# Patient Record
Sex: Male | Born: 1940 | Race: White | Hispanic: No | Marital: Single | Smoking: Never smoker
Health system: Southern US, Community
[De-identification: ages and names within clinical notes are randomized; demographics above are authoritative.]

## PROBLEM LIST (undated history)

## (undated) DIAGNOSIS — I350 Nonrheumatic aortic (valve) stenosis: Secondary | ICD-10-CM

## (undated) DIAGNOSIS — E785 Hyperlipidemia, unspecified: Secondary | ICD-10-CM

## (undated) DIAGNOSIS — H9190 Unspecified hearing loss, unspecified ear: Secondary | ICD-10-CM

## (undated) DIAGNOSIS — H903 Sensorineural hearing loss, bilateral: Secondary | ICD-10-CM

## (undated) DIAGNOSIS — R42 Dizziness and giddiness: Secondary | ICD-10-CM

## (undated) DIAGNOSIS — R030 Elevated blood-pressure reading, without diagnosis of hypertension: Secondary | ICD-10-CM

## (undated) DIAGNOSIS — I10 Essential (primary) hypertension: Secondary | ICD-10-CM

## (undated) DIAGNOSIS — IMO0002 Reserved for concepts with insufficient information to code with codable children: Secondary | ICD-10-CM

## (undated) DIAGNOSIS — H9313 Tinnitus, bilateral: Secondary | ICD-10-CM

## (undated) DIAGNOSIS — R739 Hyperglycemia, unspecified: Secondary | ICD-10-CM

## (undated) HISTORY — PX: EYE SURGERY: SHX253

## (undated) HISTORY — DX: Hyperlipidemia, unspecified: E78.5

## (undated) HISTORY — DX: Elevated blood-pressure reading, without diagnosis of hypertension: R03.0

## (undated) HISTORY — DX: Dizziness and giddiness: R42

## (undated) HISTORY — DX: Nonrheumatic aortic (valve) stenosis: I35.0

## (undated) HISTORY — DX: Sensorineural hearing loss, bilateral: H90.3

## (undated) HISTORY — DX: Hyperglycemia, unspecified: R73.9

## (undated) HISTORY — DX: Reserved for concepts with insufficient information to code with codable children: IMO0002

## (undated) HISTORY — PX: TONSILLECTOMY: SUR1361

---

## 2005-03-13 ENCOUNTER — Ambulatory Visit: Payer: Self-pay | Admitting: Internal Medicine

## 2005-04-17 ENCOUNTER — Ambulatory Visit: Payer: Self-pay | Admitting: Cardiology

## 2005-06-10 ENCOUNTER — Ambulatory Visit: Payer: Self-pay

## 2006-04-08 ENCOUNTER — Ambulatory Visit: Payer: Self-pay | Admitting: Internal Medicine

## 2006-05-01 ENCOUNTER — Ambulatory Visit: Payer: Self-pay

## 2006-05-01 ENCOUNTER — Encounter: Payer: Self-pay | Admitting: Cardiology

## 2008-05-03 ENCOUNTER — Ambulatory Visit: Payer: Self-pay | Admitting: Internal Medicine

## 2008-05-03 DIAGNOSIS — R030 Elevated blood-pressure reading, without diagnosis of hypertension: Secondary | ICD-10-CM

## 2008-05-03 HISTORY — DX: Elevated blood-pressure reading, without diagnosis of hypertension: R03.0

## 2008-05-09 ENCOUNTER — Ambulatory Visit: Payer: Self-pay | Admitting: Internal Medicine

## 2008-05-11 ENCOUNTER — Telehealth (INDEPENDENT_AMBULATORY_CARE_PROVIDER_SITE_OTHER): Payer: Self-pay | Admitting: *Deleted

## 2008-05-11 LAB — CONVERTED CEMR LAB
Basophils Absolute: 0 10*3/uL (ref 0.0–0.1)
Bilirubin, Direct: 0.1 mg/dL (ref 0.0–0.3)
Calcium: 9 mg/dL (ref 8.4–10.5)
Cholesterol: 236 mg/dL (ref 0–200)
Creatinine, Ser: 1 mg/dL (ref 0.4–1.5)
Direct LDL: 160.8 mg/dL
GFR calc Af Amer: 96 mL/min
Glucose, Bld: 106 mg/dL — ABNORMAL HIGH (ref 70–99)
HCT: 41.4 % (ref 39.0–52.0)
Hemoglobin: 14.1 g/dL (ref 13.0–17.0)
MCHC: 34.2 g/dL (ref 30.0–36.0)
Monocytes Absolute: 0.5 10*3/uL (ref 0.1–1.0)
Monocytes Relative: 8.6 % (ref 3.0–12.0)
Neutro Abs: 2.6 10*3/uL (ref 1.4–7.7)
PSA: 1.43 ng/mL (ref 0.10–4.00)
RDW: 12.5 % (ref 11.5–14.6)
Sodium: 141 meq/L (ref 135–145)
TSH: 1.82 microintl units/mL (ref 0.35–5.50)
Total Protein: 6.5 g/dL (ref 6.0–8.3)

## 2010-04-02 ENCOUNTER — Encounter: Payer: Self-pay | Admitting: Internal Medicine

## 2010-04-02 ENCOUNTER — Telehealth: Payer: Self-pay | Admitting: Internal Medicine

## 2010-04-02 ENCOUNTER — Ambulatory Visit: Payer: Self-pay | Admitting: Internal Medicine

## 2010-04-02 DIAGNOSIS — E785 Hyperlipidemia, unspecified: Secondary | ICD-10-CM | POA: Diagnosis present

## 2010-04-09 ENCOUNTER — Ambulatory Visit: Payer: Self-pay | Admitting: Internal Medicine

## 2010-04-09 LAB — CONVERTED CEMR LAB
BUN: 17 mg/dL (ref 6–23)
Calcium: 9.6 mg/dL (ref 8.4–10.5)
Creatinine, Ser: 1.1 mg/dL (ref 0.4–1.5)
Ketones, urine, test strip: NEGATIVE
Nitrite: NEGATIVE
Specific Gravity, Urine: 1.015
WBC Urine, dipstick: NEGATIVE
pH: 5

## 2010-04-13 LAB — CONVERTED CEMR LAB
AST: 17 units/L (ref 0–37)
Cholesterol: 238 mg/dL — ABNORMAL HIGH (ref 0–200)
Direct LDL: 156.8 mg/dL
HDL: 59.2 mg/dL (ref >39.00)
Total CHOL/HDL Ratio: 4
Triglycerides: 78 mg/dL (ref 0.0–149.0)
VLDL: 15.6 mg/dL (ref 0.0–40.0)

## 2010-07-31 NOTE — Assessment & Plan Note (Signed)
Summary: cpx/cbs   Vital Signs:  Patient profile:   70 year old male Height:      68.5 inches Weight:      153 pounds BMI:     23.01 Pulse rate:   81 / minute Pulse rhythm:   regular BP sitting:   118 / 80  (left arm) Cuff size:   large  Vitals Entered By: Army Fossa CMA (April 02, 2010 2:09 PM) CC: CPX, not fasting Comments declines flu shot    History of Present Illness: Here for Medicare AWV: 1.   Risk factors based on Past M, S, F history:reviewed  2.   Physical Activities: active, no routine exercise  3.   Depression/mood: denies, no problems noted  4.   Hearing: no problems , normal hearing on conversation 5.   ADL's: lives byself, totally independent  6.   Fall Risk: no recent falls, low risk  7.   Home Safety:  does feels safe at home 8.   Height, weight, &visual acuity: see VS, vision corrected w/  glasses  9.   Counseling: yes, see below  10.   Labs ordered based on risk factors: yes  11.           Referral Coordination: if needed  12.           Care Plan- see a/p  13.            Cognitive Assessment, motor skills, memory and cognition  seems appropriate   in addition we discussed the following issues history of elevated BP--does not check his BP regularly he has mild hyperlipidemia, last FLP showed elevated LDL; he has a family history of heart disease  Preventive Screening-Counseling & Management  Caffeine-Diet-Exercise     Does Patient Exercise: yes     Type of exercise: Active  Current Medications (verified): 1)  None  Allergies (verified): No Known Drug Allergies  Past History:  Past Medical History: Reviewed history from 05/03/2008 and no changes required. 05/2006 heart murmur - ECHO EF nl  Past Surgical History: Reviewed history from 04/27/2008 and no changes required. Tonsillectomy  Family History: Reviewed history from 05/03/2008 and no changes required. 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 Resp:  Denies cough and shortness of breath. GI:  Denies bloody stools, diarrhea, nausea, and vomiting. GU:  Denies dysuria, hematuria, urinary frequency, and urinary hesitancy.  Physical Exam  General:  alert, well-developed, and well-nourished.   Neck:  no masses, no thyromegaly, and normal carotid upstroke.   Lungs:  normal respiratory effort, no intercostal retractions, no accessory muscle use, and normal breath sounds.   Heart:  normal rate, regular rhythm, and II/VI Syst murmur best heard at the L upper sternum border Abdomen:  soft, non-tender, no distention, and no masses.   Rectal:  No external abnormalities noted. Normal sphincter tone. No rectal masses or tenderness. Brown stools, Hemoccult negative Prostate:  Prostate gland firm and smooth, no enlargement, nodularity, tenderness, mass, asymmetry or induration. Extremities:  no edema Neurologic:  alert & oriented X3, strength normal in all extremities, and  gait normal.   Psych:  Oriented X3, good eye contact, not anxious appearing, and not depressed appearing.     Impression & Recommendations:  Problem # 1:  HEALTH SCREENING (ICD-V70.0)  explained benefits fo Td-pneumonia shot-flu shot- shingles : declined   Colonoscopy:   Date:  07/02/2003 (2 polyps) done @ [place],    Next Due:  07/2006 Overdue for a Cscope , risk of recurrent polyps and cancer discussed States that he is undecided about where  to do his next colonoscopy:  here or in [place]. Advise to just call me and I would refer him to one of our GI doctors when he makes his mind  Check a PSA encourage diet and exercise     Orders: Medicare -1st Annual Wellness Visit [phone])  Problem # 2:  ELEVATED BLOOD PRESSURE WITHOUT DIAGNOSIS OF HYPERTENSION (ICD-796.2)  BP normal today requests EKG-- done, unchanged from previous   BP today: 118/80 Prior BP: 170/100 (05/03/2008)  Labs Reviewed: Creat: 1.0 (05/09/2008) Chol: 236 (05/09/2008)   HDL: 49.4 (05/09/2008)   LDL: DEL (05/09/2008)   TG: 91 (05/09/2008)     Orders: EKG w/ Interpretation (93000)  Problem # 3:  HYPERLIPIDEMIA, MILD (ICD-272.4) last LDL was 160. Goal around 100 (see FH); tols patient he may need meds, he is quite reluctant to the idea  This was discussed with the patient Labs Reviewed: SGOT: 17 (05/09/2008)   SGPT: 16 (05/09/2008)   HDL:49.4 (05/09/2008)  LDL:DEL (05/09/2008)  Chol:236 (05/09/2008)  Trig:91 (05/09/2008)  Patient Instructions: 1)  came back fasting: 2)  FLP, AST, ALT, TSH---dx 272.4 3)  PSA---dx screening for prostate cancer 4)  BMP---796.2 5)  Come back in one year, fasting for a physical exam   Risk Factors:  Exercise:  yes    Type:  Active

## 2010-07-31 NOTE — Progress Notes (Signed)
Summary: NEEDS URINALYSIS  Phone Note Call from Patient   Caller: Patient Summary of Call: PATIENT HAS A LAB APPT FOR BLOOD WORK ON 10/10 AT 8:20----HE FEELS LIKE HE NEEDS A URINALYSIS TOO  I TOLD HIM I COULDNT JUST ADD IT TO THE LIST SO HE WANTS TO ASK DR PAZ IF HE NEEDS ONE Initial call taken by: Jerolyn Shin,  April 02, 2010 4:01 PM  Follow-up for Phone Call        I do not see a reason why patient would need UA. Left message on voicemail to call back to office. Lucious Groves CMA  April 02, 2010 4:06 PM   okay to check a UA,Udip; urine culture if the UA abnormal. dx----v70 Jose E. Paz MD  April 03, 2010 9:05 AM   Additional Follow-up for Phone Call Additional follow up Details #1::        Patient notified. Lucious Groves CMA  April 04, 2010 9:57 AM

## 2012-03-13 DIAGNOSIS — H2510 Age-related nuclear cataract, unspecified eye: Secondary | ICD-10-CM | POA: Diagnosis not present

## 2012-03-13 DIAGNOSIS — IMO0002 Reserved for concepts with insufficient information to code with codable children: Secondary | ICD-10-CM | POA: Insufficient documentation

## 2012-09-24 DIAGNOSIS — H2510 Age-related nuclear cataract, unspecified eye: Secondary | ICD-10-CM | POA: Diagnosis not present

## 2012-11-09 ENCOUNTER — Ambulatory Visit (INDEPENDENT_AMBULATORY_CARE_PROVIDER_SITE_OTHER): Payer: Medicare Other | Admitting: Internal Medicine

## 2012-11-09 ENCOUNTER — Encounter: Payer: Self-pay | Admitting: Internal Medicine

## 2012-11-09 VITALS — BP 144/86 | HR 67 | Temp 98.1°F | Ht 68.5 in | Wt 149.0 lb

## 2012-11-09 DIAGNOSIS — R739 Hyperglycemia, unspecified: Secondary | ICD-10-CM

## 2012-11-09 DIAGNOSIS — E785 Hyperlipidemia, unspecified: Secondary | ICD-10-CM

## 2012-11-09 DIAGNOSIS — R030 Elevated blood-pressure reading, without diagnosis of hypertension: Secondary | ICD-10-CM | POA: Diagnosis not present

## 2012-11-09 DIAGNOSIS — R011 Cardiac murmur, unspecified: Secondary | ICD-10-CM | POA: Diagnosis not present

## 2012-11-09 DIAGNOSIS — Z125 Encounter for screening for malignant neoplasm of prostate: Secondary | ICD-10-CM

## 2012-11-09 DIAGNOSIS — Z0000 Encounter for general adult medical examination without abnormal findings: Secondary | ICD-10-CM

## 2012-11-09 DIAGNOSIS — R7309 Other abnormal glucose: Secondary | ICD-10-CM

## 2012-11-09 HISTORY — DX: Hyperglycemia, unspecified: R73.9

## 2012-11-09 LAB — CBC WITH DIFFERENTIAL/PLATELET
Basophils Absolute: 0 10*3/uL (ref 0.0–0.1)
Eosinophils Absolute: 0.2 10*3/uL (ref 0.0–0.7)
HCT: 43.5 % (ref 39.0–52.0)
Hemoglobin: 14.9 g/dL (ref 13.0–17.0)
Lymphocytes Relative: 30.9 % (ref 12.0–46.0)
Lymphs Abs: 2.2 10*3/uL (ref 0.7–4.0)
MCHC: 34.2 g/dL (ref 30.0–36.0)
MCV: 87.9 fl (ref 78.0–100.0)
Monocytes Absolute: 0.6 10*3/uL (ref 0.1–1.0)
Neutro Abs: 4 10*3/uL (ref 1.4–7.7)
RDW: 13.2 % (ref 11.5–14.6)

## 2012-11-09 LAB — COMPLETE METABOLIC PANEL WITH GFR
AST: 11 U/L (ref 0–37)
Albumin: 3.8 g/dL (ref 3.5–5.2)
Alkaline Phosphatase: 42 U/L (ref 39–117)
GFR, Est Non African American: 78 mL/min
Potassium: 4.1 mEq/L (ref 3.5–5.3)
Sodium: 138 mEq/L (ref 135–145)
Total Bilirubin: 0.8 mg/dL (ref 0.3–1.2)
Total Protein: 6.2 g/dL (ref 6.0–8.3)

## 2012-11-09 LAB — HEMOGLOBIN A1C: Hgb A1c MFr Bld: 5.9 % (ref 4.6–6.5)

## 2012-11-09 LAB — LIPID PANEL
Cholesterol: 231 mg/dL — ABNORMAL HIGH (ref 0–200)
VLDL: 35 mg/dL (ref 0.0–40.0)

## 2012-11-09 LAB — URINALYSIS
Bilirubin Urine: NEGATIVE
Hgb urine dipstick: NEGATIVE
Ketones, ur: NEGATIVE
Leukocytes, UA: NEGATIVE
Urobilinogen, UA: 0.2 (ref 0.0–1.0)

## 2012-11-09 NOTE — Patient Instructions (Addendum)
Check the  blood pressure 2 or 3 times a week, be sure it is between 110/60 and 140/85. If it is consistently higher or lower, let me know Next visit in 1 year  Fall Prevention and Home Safety Falls cause injuries and can affect all age groups. It is possible to use preventive measures to significantly decrease the likelihood of falls. There are many simple measures which can make your home safer and prevent falls. OUTDOORS  Repair cracks and edges of walkways and driveways.  Remove high doorway thresholds.  Trim shrubbery on the main path into your home.  Have good outside lighting.  Clear walkways of tools, rocks, debris, and clutter.  Check that handrails are not broken and are securely fastened. Both sides of steps should have handrails.  Have leaves, snow, and ice cleared regularly.  Use sand or salt on walkways during winter months.  In the garage, clean up grease or oil spills. BATHROOM  Install night lights.  Install grab bars by the toilet and in the tub and shower.  Use non-skid mats or decals in the tub or shower.  Place a plastic non-slip stool in the shower to sit on, if needed.  Keep floors dry and clean up all water on the floor immediately.  Remove soap buildup in the tub or shower on a regular basis.  Secure bath mats with non-slip, double-sided rug tape.  Remove throw rugs and tripping hazards from the floors. BEDROOMS  Install night lights.  Make sure a bedside light is easy to reach.  Do not use oversized bedding.  Keep a telephone by your bedside.  Have a firm chair with side arms to use for getting dressed.  Remove throw rugs and tripping hazards from the floor. KITCHEN  Keep handles on pots and pans turned toward the center of the stove. Use back burners when possible.  Clean up spills quickly and allow time for drying.  Avoid walking on wet floors.  Avoid hot utensils and knives.  Position shelves so they are not too high or  low.  Place commonly used objects within easy reach.  If necessary, use a sturdy step stool with a grab bar when reaching.  Keep electrical cables out of the way.  Do not use floor polish or wax that makes floors slippery. If you must use wax, use non-skid floor wax.  Remove throw rugs and tripping hazards from the floor. STAIRWAYS  Never leave objects on stairs.  Place handrails on both sides of stairways and use them. Fix any loose handrails. Make sure handrails on both sides of the stairways are as long as the stairs.  Check carpeting to make sure it is firmly attached along stairs. Make repairs to worn or loose carpet promptly.  Avoid placing throw rugs at the top or bottom of stairways, or properly secure the rug with carpet tape to prevent slippage. Get rid of throw rugs, if possible.  Have an electrician put in a light switch at the top and bottom of the stairs. OTHER FALL PREVENTION TIPS  Wear low-heel or rubber-soled shoes that are supportive and fit well. Wear closed toe shoes.  When using a stepladder, make sure it is fully opened and both spreaders are firmly locked. Do not climb a closed stepladder.  Add color or contrast paint or tape to grab bars and handrails in your home. Place contrasting color strips on first and last steps.  Learn and use mobility aids as needed. Install an [employer] emergency  response system.  Turn on lights to avoid dark areas. Replace light bulbs that burn out immediately. Get light switches that glow.  Arrange furniture to create clear pathways. Keep furniture in the same place.  Firmly attach carpet with non-skid or double-sided tape.  Eliminate uneven floor surfaces.  Select a carpet pattern that does not visually hide the edge of steps.  Be aware of all pets. OTHER HOME SAFETY TIPS  Set the water temperature for 120 F (48.8 C).  Keep emergency numbers on or near the telephone.  Keep smoke detectors on every level of the  home and near sleeping areas. Document Released: 06/07/2002 Document Revised: 12/17/2011 Document Reviewed: 09/06/2011 [facility] Patient Information 2013 [place], [place].

## 2012-11-09 NOTE — Assessment & Plan Note (Signed)
Labs

## 2012-11-09 NOTE — Assessment & Plan Note (Signed)
rec amb BPs

## 2012-11-09 NOTE — Assessment & Plan Note (Signed)
Last LDL 157, +FH of CAD, was Rx statins 2 years ago, did not take , risk of MI and statins benefits discussed, states he simply won't take meds

## 2012-11-09 NOTE — Progress Notes (Signed)
  Subjective:    Patient ID: Cesar Bullock, male    DOB: 08-11-1940, 72 y.o.   MRN: [id number]  HPI  Here for Medicare AWV: 1. Risk factors based on Past M, S, F history:reviewed  2. Physical Activities: active, no routine exercise  3. Depression/mood: denies, no problems noted  4. Hearing: no problems , normal hearing on conversation 5. ADL's: lives byself, totally independent  6. Fall Risk: no recent falls, see instructions 7. Home Safety:  does feels safe at home 8. Height, weight, &visual acuity: see VS, vision corrected w/  Glasses, sees eye doctor 9. Counseling: yes, see below  10. Labs ordered based on risk factors: yes  11.       Referral Coordination: if needed  12.       Care Plan- see a/p  13.      Cognitive Assessment, motor skills, memory and cognition  seems appropriate   in addition we discussed the following issues Mild hyperglycemia PER CHART REVIEW High cholesterol, has a family history heart disease, was prescribed statins 2 years ago, never tried. 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   Cardiovascular: Negative for chest pain, palpitations and leg swelling.  Gastrointestinal: Negative for nausea, diarrhea and blood in stool.  Genitourinary: Negative for dysuria, hematuria and difficulty urinating.       Objective:   Physical Exam BP 144/86  Pulse 67  Temp(Src) 98.1 F (36.7 C) (Oral)  Ht 5' 8.5" (1.74 m)  Wt 149 lb  (67.586 kg)  BMI 22.32 kg/m2  SpO2 97%  General -- alert, well-developed, Nad   Neck --no thyromegaly Lungs -- normal respiratory effort, no intercostal retractions, no accessory muscle use, and normal breath sounds.   Heart-- normal rate, regular rhythm, no murmur, and no gallop.   Abdomen--soft, non-tender, no distention, no masses, no HSM, no guarding, and no rigidity.   Extremities-- no pretibial edema bilaterally Rectal-- No external abnormalities noted. Normal sphincter tone. No rectal masses or tenderness. Brown stool, Hemoccult negative Prostate:  Prostate gland firm and smooth, no enlargement, nodularity, tenderness, mass, asymmetry or induration. Neurologic-- alert & oriented X3 and strength normal in all extremities. Psych-- Cognition and judgment appear intact. Alert and cooperative with normal attention span and concentration.  not anxious appearing and not depressed appearing.       Assessment & Plan:

## 2012-11-09 NOTE — Assessment & Plan Note (Signed)
H/o murmur, asx  Echo 2007: SUMMARY - Overall left ventricular systolic function was normal. Left ventricular ejection fraction was estimated , range being 55 % to 60 %. There were no left ventricular regional wall motion abnormalities. - Aortic valve thickness was mildly increased. There was mildly reduced aortic valve leaflet excursion. There was trivial aortic valvular regurgitation. The mean transaortic valve gradient was 9.1 mmHg. IMPRESSIONS - Oscillating density noted in LA most likely artifact.

## 2012-11-09 NOTE — Assessment & Plan Note (Addendum)
explained benefits fo Td-pneumonia shot-flu shot- shingles : declined   Colonoscopy:   Date:  07/02/2003 (2 polyps) done @ [place],    Next was rec for  07/2006 Overdue for a Cscope , risk of recurrent polyps and cancer discussed. States will get a Cscope in [place]   Check a PSA encourage diet and exercise Offered DEXA, he has a small frame and does not exercise "will think about it"

## 2012-11-11 ENCOUNTER — Encounter: Payer: Self-pay | Admitting: *Deleted

## 2013-03-10 ENCOUNTER — Telehealth: Payer: Self-pay

## 2013-03-10 NOTE — Telephone Encounter (Signed)
Documentation for future reference. "Agent came by on patient's behalf" to request insurance forms.  He was given NO information about patient. Agent gave name and DOB of patient. Upon research no forms have been sent to any insurance companies nor has the provider approved any for this patient.

## 2013-03-22 DIAGNOSIS — H02839 Dermatochalasis of unspecified eye, unspecified eyelid: Secondary | ICD-10-CM | POA: Diagnosis not present

## 2013-03-22 DIAGNOSIS — H2510 Age-related nuclear cataract, unspecified eye: Secondary | ICD-10-CM | POA: Diagnosis not present

## 2014-02-28 ENCOUNTER — Ambulatory Visit (INDEPENDENT_AMBULATORY_CARE_PROVIDER_SITE_OTHER): Payer: Medicare Other | Admitting: Internal Medicine

## 2014-02-28 ENCOUNTER — Encounter: Payer: Self-pay | Admitting: Internal Medicine

## 2014-02-28 VITALS — BP 132/66 | HR 61 | Temp 98.5°F | Wt 147.3 lb

## 2014-02-28 DIAGNOSIS — R030 Elevated blood-pressure reading, without diagnosis of hypertension: Secondary | ICD-10-CM

## 2014-02-28 DIAGNOSIS — R739 Hyperglycemia, unspecified: Secondary | ICD-10-CM

## 2014-02-28 DIAGNOSIS — R7309 Other abnormal glucose: Secondary | ICD-10-CM

## 2014-02-28 DIAGNOSIS — R011 Cardiac murmur, unspecified: Secondary | ICD-10-CM | POA: Diagnosis not present

## 2014-02-28 DIAGNOSIS — Z0000 Encounter for general adult medical examination without abnormal findings: Secondary | ICD-10-CM

## 2014-02-28 DIAGNOSIS — H9190 Unspecified hearing loss, unspecified ear: Secondary | ICD-10-CM

## 2014-02-28 DIAGNOSIS — E785 Hyperlipidemia, unspecified: Secondary | ICD-10-CM

## 2014-02-28 LAB — URINALYSIS, ROUTINE W REFLEX MICROSCOPIC
Bilirubin Urine: NEGATIVE
Hgb urine dipstick: NEGATIVE
KETONES UR: NEGATIVE
LEUKOCYTES UA: NEGATIVE
NITRITE: NEGATIVE
PH: 5.5 (ref 5.0–8.0)
SPECIFIC GRAVITY, URINE: 1.015 (ref 1.000–1.030)
TOTAL PROTEIN, URINE-UPE24: NEGATIVE
Urine Glucose: NEGATIVE
Urobilinogen, UA: 0.2 (ref 0.0–1.0)

## 2014-02-28 LAB — LIPID PANEL
CHOLESTEROL: 247 mg/dL — AB (ref 0–200)
HDL: 56.1 mg/dL (ref >39.00)
LDL Cholesterol: 163 mg/dL — ABNORMAL HIGH (ref 0–99)
NonHDL: 190.9
TRIGLYCERIDES: 142 mg/dL (ref 0.0–149.0)
Total CHOL/HDL Ratio: 4
VLDL: 28.4 mg/dL (ref 0.0–40.0)

## 2014-02-28 LAB — BASIC METABOLIC PANEL
BUN: 17 mg/dL (ref 6–23)
CO2: 26 mEq/L (ref 19–32)
Calcium: 9.4 mg/dL (ref 8.4–10.5)
Chloride: 104 mEq/L (ref 96–112)
Creatinine, Ser: 1 mg/dL (ref 0.4–1.5)
GFR: 77.28 mL/min (ref >60.00)
Glucose, Bld: 115 mg/dL — ABNORMAL HIGH (ref 70–99)
POTASSIUM: 4.1 meq/L (ref 3.5–5.1)
SODIUM: 138 meq/L (ref 135–145)

## 2014-02-28 LAB — HEMOGLOBIN A1C: HEMOGLOBIN A1C: 5.8 % (ref 4.6–6.5)

## 2014-02-28 NOTE — Assessment & Plan Note (Signed)
Has a significant murmur, asymptomatic, last echocardiogram 2007, recommend to repeat the echocardiogram to be sure his aortic remains okay, he declined.

## 2014-02-28 NOTE — Assessment & Plan Note (Signed)
Based on last FLP he was recommended medication and declined. Given family history I explained the benefits of lower cholesterol, he remains skeptical about medication. Plan: Check labs

## 2014-02-28 NOTE — Progress Notes (Signed)
Pre visit review using our clinic review tool, if applicable. No additional management support is needed unless otherwise documented below in the visit note. 

## 2014-02-28 NOTE — Assessment & Plan Note (Signed)
explained benefits fo Td-pneumonia shot-flu shot- shingles : declined again today   Colonoscopy:   Date:  07/02/2003 (2 polyps) done @ [place],    Next was rec for  07/2006 Overdue for a Cscope , risk of recurrent polyps and cancer discussed, offered a referral, states he will call back when ready  Checked a DRE and  PSA 2-14, wnl, next 2016 Ecourage diet and exercise Offered DEXA, he has a small frame and does not exercise-- declined

## 2014-02-28 NOTE — Assessment & Plan Note (Signed)
No ambulatory BPs, BP today very good

## 2014-02-28 NOTE — Assessment & Plan Note (Signed)
Reports he eats relatively healthy, I encouraged more exercise, check a A1c

## 2014-02-28 NOTE — Patient Instructions (Signed)
Get your blood work before you leave   Next visit is for a physical exam in 1 year, fasting Depending on your labs you may need to come back fasting Please make an appointment     Fall Prevention and [place] cause injuries and can affect all age groups. It is possible to use preventive measures to significantly decrease the likelihood of falls. There are many simple measures which can make your home safer and prevent falls. OUTDOORS  Repair cracks and edges of walkways and driveways.  Remove high doorway thresholds.  Trim shrubbery on the main path into your home.  Have good outside lighting.  Clear walkways of tools, rocks, debris, and clutter.  Check that handrails are not broken and are securely fastened. Both sides of steps should have handrails.  Have leaves, snow, and ice cleared regularly.  Use sand or salt on walkways during winter months.  In the garage, clean up grease or oil spills. BATHROOM  Install night lights.  Install grab bars by the toilet and in the tub and shower.  Use non-skid mats or decals in the tub or shower.  Place a plastic non-slip stool in the shower to sit on, if needed.  Keep floors dry and clean up all water on the floor immediately.  Remove soap buildup in the tub or shower on a regular basis.  Secure bath mats with non-slip, double-sided rug tape.  Remove throw rugs and tripping hazards from the floors. BEDROOMS  Install night lights.  Make sure a bedside light is easy to reach.  Do not use oversized bedding.  Keep a telephone by your bedside.  Have a firm chair with side arms to use for getting dressed.  Remove throw rugs and tripping hazards from the floor. KITCHEN  Keep handles on pots and pans turned toward the center of the stove. Use back burners when possible.  Clean up spills quickly and allow time for drying.  Avoid walking on wet floors.  Avoid hot utensils and knives.  Position shelves so they  are not too high or low.  Place commonly used objects within easy reach.  If necessary, use a sturdy step stool with a grab bar when reaching.  Keep electrical cables out of the way.  Do not use floor polish or wax that makes floors slippery. If you must use wax, use non-skid floor wax.  Remove throw rugs and tripping hazards from the floor. STAIRWAYS  Never leave objects on stairs.  Place handrails on both sides of stairways and use them. Fix any loose handrails. Make sure handrails on both sides of the stairways are as long as the stairs.  Check carpeting to make sure it is firmly attached along stairs. Make repairs to worn or loose carpet promptly.  Avoid placing throw rugs at the top or bottom of stairways, or properly secure the rug with carpet tape to prevent slippage. Get rid of throw rugs, if possible.  Have an electrician put in a light switch at the top and bottom of the stairs. OTHER FALL PREVENTION TIPS  Wear low-heel or rubber-soled shoes that are supportive and fit well. Wear closed toe shoes.  When using a stepladder, make sure it is fully opened and both spreaders are firmly locked. Do not climb a closed stepladder.  Add color or contrast paint or tape to grab bars and handrails in your home. Place contrasting color strips on first and last steps.  Learn and use mobility aids as needed.  Install an electrical emergency response system.  Turn on lights to avoid dark areas. Replace light bulbs that burn out immediately. Get light switches that glow.  Arrange furniture to create clear pathways. Keep furniture in the same place.  Firmly attach carpet with non-skid or double-sided tape.  Eliminate uneven floor surfaces.  Select a carpet pattern that does not visually hide the edge of steps.  Be aware of all pets. OTHER HOME SAFETY TIPS  Set the water temperature for 120 F (48.8 C).  Keep emergency numbers on or near the telephone.  Keep smoke detectors on  every level of the home and near sleeping areas. Document Released: 06/07/2002 Document Revised: 12/17/2011 Document Reviewed: 09/06/2011 [facility] Patient Information 2015 [place], [place]. This information is not intended to replace advice given to you by your health care provider. Make sure you discuss any questions you have with your health care provider.

## 2014-02-28 NOTE — Progress Notes (Signed)
Subjective:    Patient ID: Cesar Bullock, male    DOB: 03-04-1941, 73 y.o.   MRN: [id number]  DOS:  02/28/2014 Type of visit - description :  Interval history:  Here for Medicare AWV:  1. Risk factors based on Past M, S, F history:reviewed  2. Physical Activities: active, no routine exercise  3. Depression/mood: neg screeninn  4. Hearing: slt decreased needs referral to audiology Seth Bake  5. ADL's: lives byself, totally independent  6. Fall Risk: no recent falls, see instructions  7. Home Safety: does feels safe at home  8. Height, weight, &visual acuity: see VS, vision corrected w/ Glasses, sees eye doctor d/t cataracts 9. Counseling: yes, see below  10. Labs ordered based on risk factors: yes  11. Referral Coordination: if needed  12. Care Plan- see a/p  13. Cognitive Assessment, motor skills, memory and cognition seems appropriate   in addition we discussed the following issues Hyperglycemia,  watch his diet but not exercising much. Hyperlipidemia,same as above History of elevated BP, no recent ambulatory BPs. heart murmur, denies any syncope or presyncope  ROS No  CP, SOB  Denies  nausea, vomiting diarrhea, blood in the stools No abdominal pain (-) cough, sputum production (-) wheezing, chest congestion No dysuria, gross hematuria, difficulty urinating  No headaches.  Denies dizziness    Past Medical History  Diagnosis Date  . Heart murmur     rcho (-)  . HYPERLIPIDEMIA, MILD 04/02/2010    Qualifier: Diagnosis of  By: Larose Kells MD, [place] ELEVATED BLOOD PRESSURE WITHOUT DIAGNOSIS OF HYPERTENSION 05/03/2008    Qualifier: Diagnosis of  By: Larose Kells MD, [place] Hyperglycemia 11/09/2012    Past Surgical History  Procedure Laterality Date  . Tonsillectomy      age 39    History   Social History  . Marital Status: Single    Spouse Name: N/A    Number of Children: 2  . Years of Education: N/A   Occupational History  . manufacturing    Social History  Main Topics  . Smoking status: Never Smoker   . Smokeless tobacco: Never Used  . Alcohol Use: Yes     Comment: socially   . Drug Use: No  . Sexual Activity: Not on file   Other Topics Concern  . Not on file   Social History Narrative   Lives by himself   6 g-kids     Family History  Problem Relation Age of Onset  . Colon cancer Neg Hx   . Prostate cancer Neg Hx   . Diabetes Other     GM  . CAD Brother     CABG at age 29  . Stroke Mother     age 37      Medication List    Notice As of 02/28/2014  7:29 PM   You have not been prescribed any medications.         Objective:   Physical Exam BP 132/66  Pulse 61  Temp(Src) 98.5 F (36.9 C)  Wt 147 lb 4.3 oz (66.8 kg)  SpO2 97% General -- alert, well-developed, NAD.  Neck --no thyromegaly  HEENT-- Not pale. Lungs -- normal respiratory effort, no intercostal retractions, no accessory muscle use, and normal breath sounds.  Heart-- normal rate, regular rhythm, no murmur.  Abdomen-- Not distended, good bowel sounds,soft, non-tender. No mass or bruit  Extremities-- no pretibial edema bilaterally  Neurologic--  alert & oriented  X3. Speech normal, gait appropriate for age, strength symmetric and appropriate for age.  Psych-- Cognition and judgment appear intact. Cooperative with normal attention span and concentration. No anxious or depressed appearing.        Assessment & Plan:

## 2014-03-28 DIAGNOSIS — H02839 Dermatochalasis of unspecified eye, unspecified eyelid: Secondary | ICD-10-CM | POA: Diagnosis not present

## 2014-03-28 DIAGNOSIS — H2510 Age-related nuclear cataract, unspecified eye: Secondary | ICD-10-CM | POA: Diagnosis not present

## 2014-07-01 DIAGNOSIS — D126 Benign neoplasm of colon, unspecified: Secondary | ICD-10-CM

## 2014-07-01 DIAGNOSIS — H903 Sensorineural hearing loss, bilateral: Secondary | ICD-10-CM

## 2014-07-01 HISTORY — DX: Sensorineural hearing loss, bilateral: H90.3

## 2014-07-01 HISTORY — DX: Benign neoplasm of colon, unspecified: D12.6

## 2014-11-29 ENCOUNTER — Telehealth: Payer: Self-pay | Admitting: Internal Medicine

## 2014-11-29 NOTE — Telephone Encounter (Signed)
Caller name: Cesar Bullock Relationship to patient: self Can be reached: [phone], cell phone#  Reason for call: Pt requesting call directly from Dr. Larose Kells. He does not want to notify me what it is about. He states he is patient to Dr. Larose Kells and friend to Dr. Linna Darner and would like a direct call.

## 2014-11-30 NOTE — Telephone Encounter (Signed)
Appointment scheduled for 12/02/14

## 2014-11-30 NOTE — Telephone Encounter (Signed)
Spoke with the patient, reports dizziness on and off for the last 2 weeks, mostly when he gets up and start working. Denies nausea, vomiting, diarrhea or blood in the stools. No headache, chest pain. No stroke symptoms such as facial numbness or focal weaknesses. Plan: ER if he has any of the above red flags [place], call  him and set up an acute appointment for this week.

## 2014-12-02 ENCOUNTER — Ambulatory Visit (INDEPENDENT_AMBULATORY_CARE_PROVIDER_SITE_OTHER): Payer: Medicare Other | Admitting: Internal Medicine

## 2014-12-02 ENCOUNTER — Encounter: Payer: Self-pay | Admitting: Internal Medicine

## 2014-12-02 VITALS — BP 138/72 | HR 58 | Temp 97.9°F | Ht 68.5 in | Wt 145.5 lb

## 2014-12-02 DIAGNOSIS — R030 Elevated blood-pressure reading, without diagnosis of hypertension: Secondary | ICD-10-CM

## 2014-12-02 DIAGNOSIS — R739 Hyperglycemia, unspecified: Secondary | ICD-10-CM

## 2014-12-02 DIAGNOSIS — R42 Dizziness and giddiness: Secondary | ICD-10-CM

## 2014-12-02 LAB — BASIC METABOLIC PANEL
BUN: 16 mg/dL (ref 6–23)
CO2: 27 mEq/L (ref 19–32)
Calcium: 9.5 mg/dL (ref 8.4–10.5)
Chloride: 104 mEq/L (ref 96–112)
Creatinine, Ser: 1.07 mg/dL (ref 0.40–1.50)
GFR: 72.15 mL/min (ref >60.00)
Glucose, Bld: 109 mg/dL — ABNORMAL HIGH (ref 70–99)
Potassium: 3.8 mEq/L (ref 3.5–5.1)
Sodium: 137 mEq/L (ref 135–145)

## 2014-12-02 LAB — CBC WITH DIFFERENTIAL/PLATELET
BASOS ABS: 0 10*3/uL (ref 0.0–0.1)
Basophils Relative: 0.7 % (ref 0.0–3.0)
Eosinophils Absolute: 0.2 10*3/uL (ref 0.0–0.7)
Eosinophils Relative: 3.4 % (ref 0.0–5.0)
HCT: 42.1 % (ref 39.0–52.0)
Hemoglobin: 14.3 g/dL (ref 13.0–17.0)
LYMPHS ABS: 1.8 10*3/uL (ref 0.7–4.0)
LYMPHS PCT: 28 % (ref 12.0–46.0)
MCHC: 34 g/dL (ref 30.0–36.0)
MCV: 86.7 fl (ref 78.0–100.0)
MONO ABS: 0.6 10*3/uL (ref 0.1–1.0)
MONOS PCT: 9.8 % (ref 3.0–12.0)
NEUTROS PCT: 58.1 % (ref 43.0–77.0)
Neutro Abs: 3.8 10*3/uL (ref 1.4–7.7)
PLATELETS: 232 10*3/uL (ref 150.0–400.0)
RBC: 4.86 Mil/uL (ref 4.22–5.81)
RDW: 14.5 % (ref 11.5–15.5)
WBC: 6.6 10*3/uL (ref 4.0–10.5)

## 2014-12-02 LAB — HEMOGLOBIN A1C: Hgb A1c MFr Bld: 5.5 % (ref 4.6–6.5)

## 2014-12-02 NOTE — Progress Notes (Signed)
   Subjective:    Patient ID: Cesar Bullock, male    DOB: [birth year], 74 y.o.   MRN: [id number]  DOS:  12/02/2014 Type of visit - description : acute Interval history: Symptoms started approximately 2 weeks ago, complains of dizziness which is described as feeling of  unsteady/unstable, the feeling is constant but definitely worse when he stands up and decrease when sitting and laying down in bed. No problems when he turns in bed. Better if he "hold onto something"  Review of Systems  Denies chest pain, difficulty breathing, palpitations No nausea, vomiting, diarrhea blood in the stools. No headache, diplopia, slurred speech or motor deficits  Past Medical History  Diagnosis Date  . Heart murmur     rcho (-)  . HYPERLIPIDEMIA, MILD 04/02/2010    Qualifier: Diagnosis of  By: Larose Kells MD, [place] ELEVATED BLOOD PRESSURE WITHOUT DIAGNOSIS OF HYPERTENSION 05/03/2008    Qualifier: Diagnosis of  By: Larose Kells MD, [place] Hyperglycemia 11/09/2012    Past Surgical History  Procedure Laterality Date  . Tonsillectomy      age 3    History   Social History  . Marital Status: Single    Spouse Name: N/A  . Number of Children: 2  . Years of Education: N/A   Occupational History  . manufacturing    Social History Main Topics  . Smoking status: Never Smoker   . Smokeless tobacco: Never Used  . Alcohol Use: Yes     Comment: socially   . Drug Use: No  . Sexual Activity: Not on file   Other Topics Concern  . Not on file   Social History Narrative   Lives by himself   6 g-kids        Medication List    Notice  As of 12/02/2014  8:38 AM   You have not been prescribed any medications.         Objective:   Physical Exam BP 138/72 mmHg  Pulse 58  Temp(Src) 97.9 F (36.6 C) (Oral)  Ht 5' 8.5" (1.74 m)  Wt 145 lb 8 oz (65.998 kg)  BMI 21.80 kg/m2  SpO2 98% General:   Well developed, well nourished . NAD.  HEENT:  Normocephalic . Face symmetric, atraumatic Neck:  Normal carotid pulses Lungs:  CTA B Normal respiratory effort, no intercostal retractions, no accessory muscle use. Heart: RRR, + systolic murmur.  no pretibial edema bilaterally   Skin: Not pale. Not jaundice Neurologic:  alert & oriented X3.  Speech normal, gait appropriate for age and unassisted Eomi, pupils equal and reactive DTRs symmetric, motor symmetric Romberg absent Psych--  Cognition and judgment appear intact.  Cooperative with normal attention span and concentration.  Behavior appropriate. No anxious or depressed appearing.       Assessment & Plan:

## 2014-12-02 NOTE — Assessment & Plan Note (Addendum)
2 weeks history of dizziness, some features are peripheral others central (dizziness is sustained) Neuro exam normal, has a stable murmur, normal carotid pulses. Recommend further evaluation with a BMP, CBC, brain MRI to rule out a central process. The patient declined to have a MRI, explained him that without it we can be missing a serious problem, he  still declined. Plan: Labs as above, also check a A1C per patient request. Recommend good hydration and observation for now, to call if symptoms severe, different, red flags which were discussed with him.

## 2014-12-02 NOTE — Progress Notes (Signed)
Pre visit review using our clinic review tool, if applicable. No additional management support is needed unless otherwise documented below in the visit note. 

## 2014-12-02 NOTE — Patient Instructions (Signed)
Please go to the lab

## 2014-12-14 DIAGNOSIS — H02833 Dermatochalasis of right eye, unspecified eyelid: Secondary | ICD-10-CM | POA: Diagnosis not present

## 2014-12-14 DIAGNOSIS — H02836 Dermatochalasis of left eye, unspecified eyelid: Secondary | ICD-10-CM | POA: Diagnosis not present

## 2014-12-14 DIAGNOSIS — H2513 Age-related nuclear cataract, bilateral: Secondary | ICD-10-CM | POA: Diagnosis not present

## 2014-12-16 ENCOUNTER — Telehealth: Payer: Self-pay | Admitting: Internal Medicine

## 2014-12-16 DIAGNOSIS — R42 Dizziness and giddiness: Secondary | ICD-10-CM

## 2014-12-16 NOTE — Telephone Encounter (Signed)
Please advise 

## 2014-12-16 NOTE — Telephone Encounter (Signed)
Order in, let pt know

## 2014-12-16 NOTE — Telephone Encounter (Signed)
Relation to pt: self  Call back number: [phone] Pharmacy:  Reason for call:  Pt states at last visit MD suggested a brain MRI and pt was undecided at the time and he would like MD to place an order/referral to have one done. Please advise

## 2014-12-19 NOTE — Telephone Encounter (Signed)
Orders faxed to [facility], awaiting appt.

## 2014-12-26 ENCOUNTER — Ambulatory Visit
Admission: RE | Admit: 2014-12-26 | Discharge: 2014-12-26 | Disposition: A | Discharge status: Home / Self Care | Payer: Medicare Other | Source: Ambulatory Visit | Attending: Internal Medicine | Admitting: Internal Medicine

## 2014-12-26 DIAGNOSIS — R269 Unspecified abnormalities of gait and mobility: Secondary | ICD-10-CM | POA: Diagnosis not present

## 2014-12-26 DIAGNOSIS — R42 Dizziness and giddiness: Secondary | ICD-10-CM

## 2014-12-26 DIAGNOSIS — J320 Chronic maxillary sinusitis: Secondary | ICD-10-CM | POA: Diagnosis not present

## 2014-12-27 ENCOUNTER — Inpatient Hospital Stay: Admission: RE | Admit: 2014-12-27 | Payer: Medicare Other | Source: Ambulatory Visit

## 2015-01-07 ENCOUNTER — Encounter: Payer: Self-pay | Admitting: Internal Medicine

## 2015-01-09 ENCOUNTER — Other Ambulatory Visit: Payer: Self-pay

## 2015-01-09 DIAGNOSIS — R42 Dizziness and giddiness: Secondary | ICD-10-CM

## 2015-01-11 DIAGNOSIS — R55 Syncope and collapse: Secondary | ICD-10-CM | POA: Diagnosis not present

## 2015-01-11 DIAGNOSIS — Z8249 Family history of ischemic heart disease and other diseases of the circulatory system: Secondary | ICD-10-CM | POA: Diagnosis not present

## 2015-01-11 DIAGNOSIS — R0989 Other specified symptoms and signs involving the circulatory and respiratory systems: Secondary | ICD-10-CM | POA: Diagnosis not present

## 2015-01-11 DIAGNOSIS — Z8241 Family history of sudden cardiac death: Secondary | ICD-10-CM | POA: Diagnosis not present

## 2015-01-13 DIAGNOSIS — H9209 Otalgia, unspecified ear: Secondary | ICD-10-CM | POA: Diagnosis not present

## 2015-01-13 DIAGNOSIS — H6691 Otitis media, unspecified, right ear: Secondary | ICD-10-CM | POA: Diagnosis not present

## 2015-01-17 DIAGNOSIS — H9209 Otalgia, unspecified ear: Secondary | ICD-10-CM | POA: Diagnosis not present

## 2015-01-17 DIAGNOSIS — R42 Dizziness and giddiness: Secondary | ICD-10-CM | POA: Diagnosis not present

## 2015-01-17 DIAGNOSIS — H6693 Otitis media, unspecified, bilateral: Secondary | ICD-10-CM | POA: Diagnosis not present

## 2015-01-26 DIAGNOSIS — H25813 Combined forms of age-related cataract, bilateral: Secondary | ICD-10-CM | POA: Diagnosis not present

## 2015-01-26 DIAGNOSIS — H524 Presbyopia: Secondary | ICD-10-CM | POA: Diagnosis not present

## 2015-01-27 DIAGNOSIS — H269 Unspecified cataract: Secondary | ICD-10-CM | POA: Diagnosis not present

## 2015-01-27 DIAGNOSIS — R42 Dizziness and giddiness: Secondary | ICD-10-CM | POA: Diagnosis not present

## 2015-01-27 DIAGNOSIS — H6690 Otitis media, unspecified, unspecified ear: Secondary | ICD-10-CM | POA: Diagnosis not present

## 2015-01-31 DIAGNOSIS — R634 Abnormal weight loss: Secondary | ICD-10-CM | POA: Diagnosis not present

## 2015-01-31 DIAGNOSIS — H6690 Otitis media, unspecified, unspecified ear: Secondary | ICD-10-CM | POA: Diagnosis not present

## 2015-01-31 DIAGNOSIS — R350 Frequency of micturition: Secondary | ICD-10-CM | POA: Diagnosis not present

## 2015-01-31 DIAGNOSIS — Z139 Encounter for screening, unspecified: Secondary | ICD-10-CM | POA: Diagnosis not present

## 2015-01-31 DIAGNOSIS — R42 Dizziness and giddiness: Secondary | ICD-10-CM | POA: Diagnosis not present

## 2015-02-08 DIAGNOSIS — H2511 Age-related nuclear cataract, right eye: Secondary | ICD-10-CM | POA: Diagnosis not present

## 2015-02-13 DIAGNOSIS — H25811 Combined forms of age-related cataract, right eye: Secondary | ICD-10-CM | POA: Diagnosis not present

## 2015-02-13 DIAGNOSIS — H2511 Age-related nuclear cataract, right eye: Secondary | ICD-10-CM | POA: Diagnosis not present

## 2015-02-15 DIAGNOSIS — H2512 Age-related nuclear cataract, left eye: Secondary | ICD-10-CM | POA: Diagnosis not present

## 2015-02-20 DIAGNOSIS — H25812 Combined forms of age-related cataract, left eye: Secondary | ICD-10-CM | POA: Diagnosis not present

## 2015-02-20 DIAGNOSIS — H2512 Age-related nuclear cataract, left eye: Secondary | ICD-10-CM | POA: Diagnosis not present

## 2015-03-01 ENCOUNTER — Ambulatory Visit: Payer: PRIVATE HEALTH INSURANCE | Admitting: Neurology

## 2015-03-10 ENCOUNTER — Encounter: Payer: Self-pay | Admitting: *Deleted

## 2015-03-10 NOTE — Progress Notes (Signed)
No show letter sent for missed appointment on 03/01/2015

## 2015-04-03 DIAGNOSIS — R011 Cardiac murmur, unspecified: Secondary | ICD-10-CM | POA: Diagnosis not present

## 2015-04-03 DIAGNOSIS — R42 Dizziness and giddiness: Secondary | ICD-10-CM | POA: Diagnosis not present

## 2015-04-12 DIAGNOSIS — M791 Myalgia: Secondary | ICD-10-CM | POA: Diagnosis not present

## 2015-04-12 DIAGNOSIS — R51 Headache: Secondary | ICD-10-CM | POA: Diagnosis not present

## 2015-04-12 DIAGNOSIS — M9901 Segmental and somatic dysfunction of cervical region: Secondary | ICD-10-CM | POA: Diagnosis not present

## 2015-04-12 DIAGNOSIS — M9902 Segmental and somatic dysfunction of thoracic region: Secondary | ICD-10-CM | POA: Diagnosis not present

## 2015-04-12 DIAGNOSIS — H8111 Benign paroxysmal vertigo, right ear: Secondary | ICD-10-CM | POA: Diagnosis not present

## 2015-05-02 ENCOUNTER — Encounter: Payer: Self-pay | Admitting: Neurology

## 2015-05-02 ENCOUNTER — Ambulatory Visit (INDEPENDENT_AMBULATORY_CARE_PROVIDER_SITE_OTHER): Payer: Medicare Other | Admitting: Neurology

## 2015-05-02 VITALS — BP 138/80 | HR 74 | Ht 70.0 in | Wt 146.8 lb

## 2015-05-02 DIAGNOSIS — R42 Dizziness and giddiness: Secondary | ICD-10-CM | POA: Diagnosis not present

## 2015-05-02 NOTE — Patient Instructions (Signed)
Neurologic exam looks okay.  I don't think it is from the brain or anything pathological.

## 2015-05-02 NOTE — Progress Notes (Addendum)
NEUROLOGY CONSULTATION NOTE  Cesar Bullock MRN: [id number] DOB: 07-10-1940  Referring provider: Dr. Larose Kells Primary care provider: Dr. Larose Kells  Reason for consult:  dizziness  HISTORY OF PRESENT ILLNESS: Cesar Bullock is a 74 year old right-handed male who presents for dizziness.  History obtained from patient and PCP note.   Labs and MRI head images personally reviewed.  In mid-April, he began experiencing disequilibrium.  It is not lightheadedness or spinning.  He has a heaviness in his head and feels unsteady on his feet.  There is no headache and he has no prior history of headache.  He will feel the need to hold onto the wall.  He has not had any falls.  There is no associated nausea or visual disturbance.  He also reported feeling that he was stumbling with his words, although others have not noticed this.  When he is sitting or laying down, he feels fine.  It has gradually improved, but it has not fully resolved.  He denies any preceding illness or head injury.  However, at the time these symptoms started, a male employee whom he was seeing romantically left without telling him.  One day, she never showed up to work.  He had leased her an apartment and bought her a car.  She left the apartment and the car.  He has not heard from her.  This caused an emotional toll on him.   He had an MRI of the brain without contrast performed on 12/26/14, which showed no acute process or significant chronic small vessel ischemic changes.  Labs in June, including CBC, BMP and Hgb A1c, were unremarkable.  PAST MEDICAL HISTORY: Past Medical History  Diagnosis Date  . Heart murmur     rcho (-)  . HYPERLIPIDEMIA, MILD 04/02/2010    Qualifier: Diagnosis of  By: Larose Kells MD, [place] ELEVATED BLOOD PRESSURE WITHOUT DIAGNOSIS OF HYPERTENSION 05/03/2008    Qualifier: Diagnosis of  By: Larose Kells MD, [place] Hyperglycemia 11/09/2012    PAST SURGICAL HISTORY: Past Surgical History  Procedure Laterality Date  .  Tonsillectomy      age 17    MEDICATIONS: No current outpatient prescriptions on file prior to visit.   No current facility-administered medications on file prior to visit.    ALLERGIES: No Known Allergies  FAMILY HISTORY: Family History  Problem Relation Age of Onset  . Colon cancer Neg Hx   . Prostate cancer Neg Hx   . Diabetes Other     GM  . CAD Brother     CABG at age 109  . Stroke Mother     age 104    SOCIAL HISTORY: Social History   Social History  . Marital Status: Single    Spouse Name: N/A  . Number of Children: 2  . Years of Education: N/A   Occupational History  . manufacturing    Social History Main Topics  . Smoking status: Never Smoker   . Smokeless tobacco: Never Used  . Alcohol Use: Yes     Comment: socially   . Drug Use: No  . Sexual Activity: Not on file   Other Topics Concern  . Not on file   Social History Narrative   Lives by himself   6 g-kids   Has stairs, doesn't use often. Highest level of education is some college       REVIEW OF SYSTEMS: Constitutional: No fevers, chills, or sweats, no generalized fatigue, change  in appetite Eyes: No visual changes, double vision, eye pain Ear, nose and throat: No hearing loss, ear pain, nasal congestion, sore throat Cardiovascular: No chest pain, palpitations Respiratory:  No shortness of breath at rest or with exertion, wheezes GastrointestinaI: No nausea, vomiting, diarrhea, abdominal pain, fecal incontinence Genitourinary:  No dysuria, urinary retention or frequency Musculoskeletal:  No neck pain, back pain Integumentary: No rash, pruritus, skin lesions Neurological: as above Psychiatric: No depression, insomnia, anxiety Endocrine: No palpitations, fatigue, diaphoresis, mood swings, change in appetite, change in weight, increased thirst Hematologic/Lymphatic:  No anemia, purpura, petechiae. Allergic/Immunologic: no itchy/runny eyes, nasal congestion, recent allergic reactions,  rashes  PHYSICAL EXAM: Filed Vitals:   05/02/15 0800  BP: 138/80  Pulse: 74   General: No acute distress.  Patient appears well-groomed.  Head:  Normocephalic/atraumatic Eyes:  fundi unremarkable, without vessel changes, exudates, hemorrhages or papilledema. Neck: supple, no paraspinal tenderness, full range of motion Back: No paraspinal tenderness Heart: regular rate and rhythm Lungs: Clear to auscultation bilaterally. Vascular: No carotid bruits. Neurological Exam: Mental status: alert and oriented to person, place, and time, recent and remote memory intact, fund of knowledge intact, attention and concentration intact, speech fluent and not dysarthric, language intact. Cranial nerves: CN I: not tested CN II: pupils equal, round and reactive to light, visual fields intact, fundi unremarkable, without vessel changes, exudates, hemorrhages or papilledema. CN III, IV, VI:  full range of motion, no nystagmus, no ptosis CN V: facial sensation intact CN VII: upper and lower face symmetric CN VIII: hearing intact CN IX, X: gag intact, uvula midline CN XI: sternocleidomastoid and trapezius muscles intact CN XII: tongue midline Bulk & Tone: normal, no fasciculations. Motor:  5/5 throughout Sensation:  Pinprick and vibration sensation intact. Deep Tendon Reflexes:  2+ throughout, toes downgoing.  Finger to nose testing:  Without dysmetria.  Heel to shin:  Without dysmetria.  Gait:  Normal station and stride.  Able to turn and tandem walk with a brief unsteadiness. Romberg negative.  IMPRESSION: Disequilibrium.  Neurologic exam and MRI unremarkable.  I do not have a neurologic explanation for her symptoms but I suspect it may be psychosomatic related to the his girlfriend leaving.  PLAN: No further workup or follow up needed.   Thank you for allowing me to take part in the care of this patient.  Metta Clines, DO  CC:  Kathlene November, MD

## 2015-05-04 ENCOUNTER — Ambulatory Visit (AMBULATORY_SURGERY_CENTER): Payer: Self-pay

## 2015-05-04 VITALS — Ht 68.25 in | Wt 149.6 lb

## 2015-05-04 DIAGNOSIS — Z1211 Encounter for screening for malignant neoplasm of colon: Secondary | ICD-10-CM

## 2015-05-04 MED ORDER — SUPREP BOWEL PREP KIT 17.5-3.13-1.6 GM/177ML PO SOLN: 1.0000 | Freq: Once | ORAL | Status: DC

## 2015-05-04 NOTE — Progress Notes (Signed)
No allergies to eggs or soy No diet/weight loss meds No home oxygen No past problems with anesthesia  Has email; refused emmi has had 3 before

## 2015-05-12 ENCOUNTER — Encounter: Payer: Self-pay | Admitting: Gastroenterology

## 2015-05-12 ENCOUNTER — Ambulatory Visit (AMBULATORY_SURGERY_CENTER): Payer: Medicare Other | Admitting: Gastroenterology

## 2015-05-12 VITALS — BP 143/83 | HR 68 | Temp 97.7°F | Resp 13 | Ht 68.25 in | Wt 149.0 lb

## 2015-05-12 DIAGNOSIS — D128 Benign neoplasm of rectum: Secondary | ICD-10-CM

## 2015-05-12 DIAGNOSIS — Z1211 Encounter for screening for malignant neoplasm of colon: Secondary | ICD-10-CM

## 2015-05-12 DIAGNOSIS — D129 Benign neoplasm of anus and anal canal: Secondary | ICD-10-CM

## 2015-05-12 DIAGNOSIS — D123 Benign neoplasm of transverse colon: Secondary | ICD-10-CM | POA: Diagnosis not present

## 2015-05-12 MED ORDER — SODIUM CHLORIDE 0.9 % IV SOLN: 500.0000 mL | INTRAVENOUS | Status: DC

## 2015-05-12 NOTE — Patient Instructions (Signed)
YOU HAD AN ENDOSCOPIC PROCEDURE TODAY AT [facility]:   Refer to the procedure report that was given to you for any specific questions about what was found during the examination.  If the procedure report does not answer your questions, please call your gastroenterologist to clarify.  If you requested that your care partner not be given the details of your procedure findings, then the procedure report has been included in a sealed envelope for you to review at your convenience later.  YOU SHOULD EXPECT: Some feelings of bloating in the abdomen. Passage of more gas than usual.  Walking can help get rid of the air that was put into your GI tract during the procedure and reduce the bloating. If you had a lower endoscopy (such as a colonoscopy or flexible sigmoidoscopy) you may notice spotting of blood in your stool or on the toilet paper. If you underwent a bowel prep for your procedure, you may not have a normal bowel movement for a few days.  Please Note:  You might notice some irritation and congestion in your nose or some drainage.  This is from the oxygen used during your procedure.  There is no need for concern and it should clear up in a day or so.  SYMPTOMS TO REPORT IMMEDIATELY:   Following lower endoscopy (colonoscopy or flexible sigmoidoscopy):  Excessive amounts of blood in the stool  Significant tenderness or worsening of abdominal pains  Swelling of the abdomen that is new, acute  Fever of 100F or higher  For urgent or emergent issues, a gastroenterologist can be reached at any hour by calling [phone].   DIET: Your first meal following the procedure should be a small meal and then it is ok to progress to your normal diet. Heavy or fried foods are harder to digest and may make you feel nauseous or bloated.  Likewise, meals heavy in dairy and vegetables can increase bloating.  Drink plenty of fluids but you should avoid alcoholic beverages for 24  hours.  ACTIVITY:  You should plan to take it easy for the rest of today and you should NOT DRIVE or use heavy machinery until tomorrow (because of the sedation medicines used during the test).    FOLLOW UP: Our staff will call the number listed on your records the next business day following your procedure to check on you and address any questions or concerns that you may have regarding the information given to you following your procedure. If we do not reach you, we will leave a message.  However, if you are feeling well and you are not experiencing any problems, there is no need to return our call.  We will assume that you have returned to your regular daily activities without incident.  If any biopsies were taken you will be contacted by phone or by letter within the next 1-3 weeks.  Please call us at [phone] if you have not heard about the biopsies in 3 weeks.    SIGNATURES/CONFIDENTIALITY: You and/or your care partner have signed paperwork which will be entered into your electronic medical record.  These signatures attest to the fact that that the information above on your After Visit Summary has been reviewed and is understood.  Full responsibility of the confidentiality of this discharge information lies with you and/or your care-partner.  Please review polyp, diverticulosis, and high fiber diet handouts provided. Next colonoscopy determined by pathology results.

## 2015-05-12 NOTE — Progress Notes (Signed)
Report to PACU, RN, vss, BBS= Clear.  

## 2015-05-12 NOTE — Op Note (Signed)
[facility]  [address] [place], [address]   COLONOSCOPY PROCEDURE REPORT  PATIENT: Cesar Bullock, Cesar Bullock  MR#: [id number] BIRTHDATE: December 27, 1940 , 74  yrs. old GENDER: male ENDOSCOPIST: Milus Banister, MD REFERRED [id number] Larose Kells, M.D. PROCEDURE DATE:  05/12/2015 PROCEDURE:   Colonoscopy, screening and Colonoscopy with snare polypectomy First Screening Colonoscopy - Avg.  risk and is 50 yrs.  old or older Yes.  Prior Negative Screening - Now for repeat screening. N/A  History of Adenoma - Now for follow-up colonoscopy & has been > or = to 3 yrs.  N/A  Polyps removed today? Yes ASA CLASS:   Class II INDICATIONS:Screening for colonic neoplasia and Colorectal Neoplasm Risk Assessment for this procedure is average risk. MEDICATIONS: Monitored anesthesia care and Propofol 200 mg IV  DESCRIPTION OF PROCEDURE:   After the risks benefits and alternatives of the procedure were thoroughly explained, informed consent was obtained.  The digital rectal exam revealed no abnormalities of the rectum.   The [id number]  endoscope was introduced through the anus and advanced to the cecum, which was identified by both the appendix and ileocecal valve. No adverse events experienced.   The quality of the prep was excellent.  The instrument was then slowly withdrawn as the colon was fully examined. Estimated blood loss is zero unless otherwise noted in this procedure report.  COLON FINDINGS: Three sessile polyps ranging between 3-29mm in size were found in the rectum and transverse colon.  Polypectomies were performed with a cold snare.  The resection was complete, the polyp tissue was completely retrieved and sent to histology.   There was mild diverticulosis noted in the left colon.   The examination was otherwise normal.  Retroflexed views revealed no abnormalities. The time to cecum = 3.5 Withdrawal time = 10.0   The scope was withdrawn and the procedure  completed. COMPLICATIONS: There were no immediate complications.  ENDOSCOPIC IMPRESSION: 1.   Three sessile polyps ranging between 3-36mm in size were found in the rectum and transverse colon; polypectomies were performed with a cold snare 2.   Mild diverticulosis was noted in the left colon 3.   The examination was otherwise normal  RECOMMENDATIONS: If the polyp(s) removed today are proven to be adenomatous (pre-cancerous) polyps, you will need a colonoscopy in 3-5 years. Otherwise you should continue to follow colorectal cancer screening guidelines for "routine risk" patients with a colonoscopy in 10 years.  You will receive a letter within 1-2 weeks with the results of your biopsy as well as final recommendations.  Please call my office if you have not received a letter after 3 weeks.  eSigned:  Milus Banister, MD 05/12/2015 9:03 AM

## 2015-05-12 NOTE — Progress Notes (Signed)
Called to room to assist during endoscopic procedure.  Patient ID and intended procedure confirmed with present staff. Received instructions for my participation in the procedure from the performing physician.  

## 2015-05-15 ENCOUNTER — Telehealth: Payer: Self-pay | Admitting: *Deleted

## 2015-05-15 NOTE — Telephone Encounter (Signed)
  Follow up Call-  Call back number 05/12/2015  Post procedure Call Back phone  # [phone]  Permission to leave phone message Yes     Patient questions:  Do you have a fever, pain , or abdominal swelling? No. Pain Score  0 *  Have you tolerated food without any problems? Yes.    Have you been able to return to your normal activities? Yes.    Do you have any questions about your discharge instructions: Diet   No. Medications  No. Follow up visit  No.  Do you have questions or concerns about your Care? No.  Actions: * If pain score is 4 or above: No action needed, pain <4.

## 2015-05-18 ENCOUNTER — Encounter: Payer: Self-pay | Admitting: Gastroenterology

## 2015-05-18 DIAGNOSIS — H524 Presbyopia: Secondary | ICD-10-CM | POA: Diagnosis not present

## 2015-05-18 DIAGNOSIS — H26493 Other secondary cataract, bilateral: Secondary | ICD-10-CM | POA: Diagnosis not present

## 2015-05-18 DIAGNOSIS — H35342 Macular cyst, hole, or pseudohole, left eye: Secondary | ICD-10-CM | POA: Diagnosis not present

## 2015-06-02 DIAGNOSIS — H43823 Vitreomacular adhesion, bilateral: Secondary | ICD-10-CM | POA: Diagnosis not present

## 2015-06-02 DIAGNOSIS — H59032 Cystoid macular edema following cataract surgery, left eye: Secondary | ICD-10-CM | POA: Diagnosis not present

## 2015-06-12 ENCOUNTER — Ambulatory Visit (INDEPENDENT_AMBULATORY_CARE_PROVIDER_SITE_OTHER): Payer: Medicare Other | Admitting: Internal Medicine

## 2015-06-12 ENCOUNTER — Encounter: Payer: Self-pay | Admitting: Internal Medicine

## 2015-06-12 VITALS — BP 126/76 | HR 69 | Temp 97.5°F | Ht 68.0 in | Wt 151.1 lb

## 2015-06-12 DIAGNOSIS — R011 Cardiac murmur, unspecified: Secondary | ICD-10-CM | POA: Diagnosis not present

## 2015-06-12 DIAGNOSIS — H9193 Unspecified hearing loss, bilateral: Secondary | ICD-10-CM

## 2015-06-12 DIAGNOSIS — E785 Hyperlipidemia, unspecified: Secondary | ICD-10-CM

## 2015-06-12 DIAGNOSIS — Z0000 Encounter for general adult medical examination without abnormal findings: Secondary | ICD-10-CM

## 2015-06-12 DIAGNOSIS — R42 Dizziness and giddiness: Secondary | ICD-10-CM

## 2015-06-12 DIAGNOSIS — Z125 Encounter for screening for malignant neoplasm of prostate: Secondary | ICD-10-CM | POA: Diagnosis not present

## 2015-06-12 DIAGNOSIS — Z09 Encounter for follow-up examination after completed treatment for conditions other than malignant neoplasm: Secondary | ICD-10-CM | POA: Insufficient documentation

## 2015-06-12 LAB — LIPID PANEL
CHOL/HDL RATIO: 4
Cholesterol: 232 mg/dL — ABNORMAL HIGH (ref 0–200)
HDL: 63.7 mg/dL (ref >39.00)
LDL CALC: 148 mg/dL — AB (ref 0–99)
NONHDL: 167.84
TRIGLYCERIDES: 99 mg/dL (ref 0.0–149.0)
VLDL: 19.8 mg/dL (ref 0.0–40.0)

## 2015-06-12 LAB — TSH: TSH: 2.67 u[IU]/mL (ref 0.35–4.50)

## 2015-06-12 LAB — PSA, MEDICARE: PSA: 2.15 ng/mL (ref 0.10–4.00)

## 2015-06-12 NOTE — Progress Notes (Signed)
Pre visit review using our clinic review tool, if applicable. No additional management support is needed unless otherwise documented below in the visit note. 

## 2015-06-12 NOTE — Assessment & Plan Note (Addendum)
Td-pneumonia shot-flu shot- shingles : declined again today   Colonoscopy:   Date:  07/02/2003 (2 polyps) done @ [facility], Cscope again 05-2015, Dr Maryelizabeth Kaufmann per GI  Checked a DRE and  PSA  Ecourage diet and exercise

## 2015-06-12 NOTE — Progress Notes (Signed)
Subjective:    Patient ID: Cesar Bullock, male    DOB: 1941-04-11, 74 y.o.   MRN: [id number]  DOS:  06/12/2015 Type of visit - description :   Here for Medicare AWV:   1. Risk factors based on Past M, S, F history:reviewed   2. Physical Activities: active, no routine exercise   3. Depression/mood: neg screeninn   4. Hearing: slt decreased needs referral to audiology Seth Bake   5. ADL's: lives byself, totally independent   6. Fall Risk: had a fall> 1 year ago, landed on R hand, still some pain there, see instructions   7. Home Safety: does feels safe at home   8. Height, weight, &visual acuity: see VS, vision corrected w/ Glasses, s/p cataracts ~ 01-2015, saw a retina specialist 9. Counseling: yes, see below   10. Labs ordered based on risk factors: yes   11. Referral Coordination: if needed   12. Care Plan- see a/p   13. Cognitive Assessment, motor skills, memory and cognition seems appropriate  14. Providers list updated  15. End of life care-- HC-POA discussed, MOST form provided   in addition we discussed the following issues  Dizziness: still an issue, would like something done about it. ENT? Heart murmur: Due for a Echocardiogram elevated BP: BP today is normal. Hyperlipidemia: due for labs     Review of Systems Constitutional: No fever. No chills. No unexplained wt changes. No unusual sweats  HEENT: No dental problems, no ear discharge, no facial swelling, no voice changes. No eye discharge, no eye  redness , no  intolerance to light   Respiratory: No wheezing , no  difficulty breathing. No cough , no mucus production  Cardiovascular: No CP, no leg swelling , no  Palpitations  GI: no nausea, no vomiting, no diarrhea , no  abdominal pain.  No blood in the stools. No dysphagia, no odynophagia    Endocrine: No polyphagia, no polyuria , no polydipsia  GU: No dysuria, gross hematuria, difficulty urinating. No urinary urgency, no frequency.  Musculoskeletal: No  joint swellings or unusual aches or pains  Skin: No change in the color of the skin, palor , no  Rash  Allergic, immunologic: No environmental allergies , no  food allergies  Neurological:  Continue with disequilibrium, no  syncope. No headaches. No diplopia, no slurred, no slurred speech, no motor deficits, no facial  Numbness  Hematological: No enlarged lymph nodes, no easy bruising , no unusual bleedings  Psychiatry: No suicidal ideas, no hallucinations, no beavior problems, no confusion.  No unusual/severe anxiety, no depression   Past Medical History  Diagnosis Date  . Heart murmur     rcho (-)  . HYPERLIPIDEMIA, MILD 04/02/2010    Qualifier: Diagnosis of  By: Larose Kells MD, [place] ELEVATED BLOOD PRESSURE WITHOUT DIAGNOSIS OF HYPERTENSION 05/03/2008    Qualifier: Diagnosis of  By: Larose Kells MD, [place] Hyperglycemia 11/09/2012  . Nuclear cataract     Past Surgical History  Procedure Laterality Date  . Tonsillectomy      age 11    Social History   Social History  . Marital Status: Single    Spouse Name: N/A  . Number of Children: 2  . Years of Education: N/A   Occupational History  . Psychologist, educational, works full time    Social History Main Topics  . Smoking status: Never Smoker   . Smokeless tobacco: Never Used  . Alcohol Use: 3.6 oz/week  6 Shots of liquor per week     Comment: vodka  . Drug Use: No  . Sexual Activity: Not on file   Other Topics Concern  . Not on file   Social History Narrative   Lives by himself   6 g-kids            Family History  Problem Relation Age of Onset  . Colon cancer Neg Hx   . Prostate cancer Neg Hx   . Diabetes Other     GM  . CAD Brother     CABG at age 71  . Stroke Mother     age 77       Medication List    Notice  As of 06/12/2015  6:19 PM   You have not been prescribed any medications.         Objective:   Physical Exam BP 126/76 mmHg  Pulse 69  Temp(Src) 97.5 F (36.4 C) (Oral)  Ht 5\' 8"  (1.727  m)  Wt 151 lb 2 oz (68.55 kg)  BMI 22.98 kg/m2  SpO2 99% General:   Well developed, well nourished . NAD.  Neck:  no  Thyromegaly  HEENT:  Normocephalic . Face symmetric, atraumatic Lungs:  CTA B Normal respiratory effort, no intercostal retractions, no accessory muscle use. Heart: RRR,  III/VI murmur.  No pretibial edema bilaterally  Abdomen:  Not distended, soft, non-tender. No rebound or rigidity. No mass,organomegaly Skin: Exposed areas without rash. Not pale. Not jaundice Neurologic:  alert & oriented X3.  Speech normal, gait appropriate for age and unassisted Strength symmetric and appropriate for age.  Psych: Cognition and judgment appear intact.  Cooperative with normal attention span and concentration.  Behavior appropriate. No anxious or depressed appearing.    Assessment & Plan:   Assessment > Hyperglycemia Elevated BP without HTN Hyperlipidemia Heart murmur HOH  Plan: Dizziness: Saw neurology, no further workup recommended, patient is still symptomatic, request ENT referral. Declined vestibular rehabilitation referral Murmur: Recommend echocardiogram Dyslipidemia: Check a TSH and FLP RTC 6-8  months

## 2015-06-12 NOTE — Patient Instructions (Signed)
Get your blood work before you leave   Next visit in 6-8 months    Fall Prevention and [place] cause injuries and can affect all age groups. It is possible to use preventive measures to significantly decrease the likelihood of falls. There are many simple measures which can make your home safer and prevent falls. OUTDOORS  Repair cracks and edges of walkways and driveways.  Remove high doorway thresholds.  Trim shrubbery on the main path into your home.  Have good outside lighting.  Clear walkways of tools, rocks, debris, and clutter.  Check that handrails are not broken and are securely fastened. Both sides of steps should have handrails.  Have leaves, snow, and ice cleared regularly.  Use sand or salt on walkways during winter months.  In the garage, clean up grease or oil spills. BATHROOM  Install night lights.  Install grab bars by the toilet and in the tub and shower.  Use non-skid mats or decals in the tub or shower.  Place a plastic non-slip stool in the shower to sit on, if needed.  Keep floors dry and clean up all water on the floor immediately.  Remove soap buildup in the tub or shower on a regular basis.  Secure bath mats with non-slip, double-sided rug tape.  Remove throw rugs and tripping hazards from the floors. BEDROOMS  Install night lights.  Make sure a bedside light is easy to reach.  Do not use oversized bedding.  Keep a telephone by your bedside.  Have a firm chair with side arms to use for getting dressed.  Remove throw rugs and tripping hazards from the floor. KITCHEN  Keep handles on pots and pans turned toward the center of the stove. Use back burners when possible.  Clean up spills quickly and allow time for drying.  Avoid walking on wet floors.  Avoid hot utensils and knives.  Position shelves so they are not too high or low.  Place commonly used objects within easy reach.  If necessary, use a sturdy step stool  with a grab bar when reaching.  Keep electrical cables out of the way.  Do not use floor polish or wax that makes floors slippery. If you must use wax, use non-skid floor wax.  Remove throw rugs and tripping hazards from the floor. STAIRWAYS  Never leave objects on stairs.  Place handrails on both sides of stairways and use them. Fix any loose handrails. Make sure handrails on both sides of the stairways are as long as the stairs.  Check carpeting to make sure it is firmly attached along stairs. Make repairs to worn or loose carpet promptly.  Avoid placing throw rugs at the top or bottom of stairways, or properly secure the rug with carpet tape to prevent slippage. Get rid of throw rugs, if possible.  Have an electrician put in a light switch at the top and bottom of the stairs. OTHER FALL PREVENTION TIPS  Wear low-heel or rubber-soled shoes that are supportive and fit well. Wear closed toe shoes.  When using a stepladder, make sure it is fully opened and both spreaders are firmly locked. Do not climb a closed stepladder.  Add color or contrast paint or tape to grab bars and handrails in your home. Place contrasting color strips on first and last steps.  Learn and use mobility aids as needed. Install an electrical emergency response system.  Turn on lights to avoid dark areas. Replace light bulbs that burn out immediately. Get  Get light switches that glow.  Arrange furniture to create clear pathways. Keep furniture in the same place.  Firmly attach carpet with non-skid or double-sided tape.  Eliminate uneven floor surfaces.  Select a carpet pattern that does not visually hide the edge of steps.  Be aware of all pets. OTHER HOME SAFETY TIPS  Set the water temperature for 120 F (48.8 C).  Keep emergency numbers on or near the telephone.  Keep smoke detectors on every level of the home and near sleeping areas. Document Released: 06/07/2002 Document Revised: 12/17/2011  Document Reviewed: 09/06/2011 ExitCare Patient Information 2015 [employer]. This information is not intended to replace advice given to you by your health care provider. Make sure you discuss any questions you have with your health care provider.   Preventive Care for Adults Ages 65 and over  Blood pressure check.** / Every 1 to 2 years.  Lipid and cholesterol check.**/ Every 5 years beginning at age 20.  Lung cancer screening. / Every year if you are aged 55-80 years and have a 30-pack-year history of smoking and currently smoke or have quit within the past 15 years. Yearly screening is stopped once you have quit smoking for at least 15 years or develop a health problem that would prevent you from having lung cancer treatment.  Fecal occult blood test (FOBT) of stool. / Every year beginning at age 50 and continuing until age 75. You may not have to do this test if you get a colonoscopy every 10 years.  Flexible sigmoidoscopy** or colonoscopy.** / Every 5 years for a flexible sigmoidoscopy or every 10 years for a colonoscopy beginning at age 50 and continuing until age 75.  Hepatitis C blood test.** / For all people born from 1945 through 1965 and any individual with known risks for hepatitis C.  Abdominal aortic aneurysm (AAA) screening.** / A one-time screening for ages 65 to 75 years who are current or former smokers.  Skin self-exam. / Monthly.  Influenza vaccine. / Every year.  Tetanus, diphtheria, and acellular pertussis (Tdap/Td) vaccine.** / 1 dose of Td every 10 years.  Varicella vaccine.** / Consult your health care provider.  Zoster vaccine.** / 1 dose for adults aged 60 years or older.  Pneumococcal 13-valent conjugate (PCV13) vaccine.** / Consult your health care provider.  Pneumococcal polysaccharide (PPSV23) vaccine.** / 1 dose for all adults aged 65 years and older.  Meningococcal vaccine.** / Consult your health care provider.  Hepatitis A vaccine.** /  Consult your health care provider.  Hepatitis B vaccine.** / Consult your health care provider.  Haemophilus influenzae type b (Hib) vaccine.** / Consult your health care provider. **Family history and personal history of risk and conditions may change your health care provider's recommendations. Document Released: 08/13/2001 Document Revised: 06/22/2013 Document Reviewed: 11/12/2010 ExitCare Patient Information 2015 [employer]. This information is not intended to replace advice given to you by your health care provider. Make sure you discuss any questions you have with your health care provider.   

## 2015-06-12 NOTE — Assessment & Plan Note (Signed)
Dizziness: Saw neurology, no further workup recommended, patient is still symptomatic, request ENT referral. Declined vestibular rehabilitation referral Murmur: Recommend echocardiogram Dyslipidemia: Check a TSH and FLP RTC 6-8  months

## 2015-06-16 DIAGNOSIS — H903 Sensorineural hearing loss, bilateral: Secondary | ICD-10-CM | POA: Diagnosis not present

## 2015-06-16 DIAGNOSIS — R42 Dizziness and giddiness: Secondary | ICD-10-CM | POA: Diagnosis not present

## 2015-07-10 ENCOUNTER — Other Ambulatory Visit (HOSPITAL_COMMUNITY): Payer: Medicare Other

## 2015-07-19 ENCOUNTER — Ambulatory Visit (HOSPITAL_COMMUNITY): Payer: Medicare Other | Attending: Cardiovascular Disease

## 2015-07-19 ENCOUNTER — Other Ambulatory Visit: Payer: Self-pay

## 2015-07-19 DIAGNOSIS — I340 Nonrheumatic mitral (valve) insufficiency: Secondary | ICD-10-CM | POA: Diagnosis not present

## 2015-07-19 DIAGNOSIS — R011 Cardiac murmur, unspecified: Secondary | ICD-10-CM | POA: Diagnosis not present

## 2015-07-19 DIAGNOSIS — I352 Nonrheumatic aortic (valve) stenosis with insufficiency: Secondary | ICD-10-CM | POA: Insufficient documentation

## 2015-07-19 DIAGNOSIS — I517 Cardiomegaly: Secondary | ICD-10-CM | POA: Insufficient documentation

## 2015-07-19 HISTORY — PX: TRANSTHORACIC ECHOCARDIOGRAM: SHX275

## 2015-08-07 DIAGNOSIS — E7800 Pure hypercholesterolemia, unspecified: Secondary | ICD-10-CM | POA: Diagnosis not present

## 2015-08-07 DIAGNOSIS — A6920 Lyme disease, unspecified: Secondary | ICD-10-CM | POA: Diagnosis not present

## 2015-08-07 DIAGNOSIS — R42 Dizziness and giddiness: Secondary | ICD-10-CM | POA: Diagnosis not present

## 2015-08-07 DIAGNOSIS — E559 Vitamin D deficiency, unspecified: Secondary | ICD-10-CM | POA: Diagnosis not present

## 2015-08-07 DIAGNOSIS — R5381 Other malaise: Secondary | ICD-10-CM | POA: Diagnosis not present

## 2015-08-07 DIAGNOSIS — D519 Vitamin B12 deficiency anemia, unspecified: Secondary | ICD-10-CM | POA: Diagnosis not present

## 2015-08-09 DIAGNOSIS — H43823 Vitreomacular adhesion, bilateral: Secondary | ICD-10-CM | POA: Diagnosis not present

## 2015-08-09 DIAGNOSIS — H59032 Cystoid macular edema following cataract surgery, left eye: Secondary | ICD-10-CM | POA: Diagnosis not present

## 2015-09-07 DIAGNOSIS — H26493 Other secondary cataract, bilateral: Secondary | ICD-10-CM | POA: Diagnosis not present

## 2015-09-07 DIAGNOSIS — H524 Presbyopia: Secondary | ICD-10-CM | POA: Diagnosis not present

## 2015-09-07 DIAGNOSIS — H35342 Macular cyst, hole, or pseudohole, left eye: Secondary | ICD-10-CM | POA: Diagnosis not present

## 2015-11-29 DIAGNOSIS — J9801 Acute bronchospasm: Secondary | ICD-10-CM | POA: Diagnosis not present

## 2015-11-29 DIAGNOSIS — J209 Acute bronchitis, unspecified: Secondary | ICD-10-CM | POA: Diagnosis not present

## 2016-01-17 DIAGNOSIS — R011 Cardiac murmur, unspecified: Secondary | ICD-10-CM | POA: Diagnosis not present

## 2016-01-17 DIAGNOSIS — Z139 Encounter for screening, unspecified: Secondary | ICD-10-CM | POA: Diagnosis not present

## 2016-01-17 DIAGNOSIS — J9801 Acute bronchospasm: Secondary | ICD-10-CM | POA: Diagnosis not present

## 2016-01-17 DIAGNOSIS — J209 Acute bronchitis, unspecified: Secondary | ICD-10-CM | POA: Diagnosis not present

## 2016-01-19 DIAGNOSIS — I10 Essential (primary) hypertension: Secondary | ICD-10-CM | POA: Diagnosis not present

## 2016-01-19 DIAGNOSIS — E119 Type 2 diabetes mellitus without complications: Secondary | ICD-10-CM | POA: Diagnosis not present

## 2016-02-12 ENCOUNTER — Encounter: Payer: Self-pay | Admitting: Internal Medicine

## 2016-02-12 ENCOUNTER — Ambulatory Visit (INDEPENDENT_AMBULATORY_CARE_PROVIDER_SITE_OTHER): Payer: Medicare Other | Admitting: Internal Medicine

## 2016-02-12 VITALS — BP 118/70 | HR 64 | Temp 97.5°F | Resp 12 | Ht 68.0 in | Wt 144.2 lb

## 2016-02-12 DIAGNOSIS — I350 Nonrheumatic aortic (valve) stenosis: Secondary | ICD-10-CM | POA: Diagnosis not present

## 2016-02-12 DIAGNOSIS — R030 Elevated blood-pressure reading, without diagnosis of hypertension: Secondary | ICD-10-CM

## 2016-02-12 DIAGNOSIS — R42 Dizziness and giddiness: Secondary | ICD-10-CM

## 2016-02-12 NOTE — Progress Notes (Signed)
   Subjective:    Patient ID: ERI SCHU, male    DOB: [birth year], 75 y.o.   MRN: [id number]  DOS:  02/12/2016 Type of visit - description : rov Interval history: Since the last visit is doing well, did have  a persisting cough, went to another office, s/p  antibiotics 2, prednisone and inhaler. feeling better. We discussed the results of his last echocardiogram.    Review of Systems Denies chest pain, difficulty breathing, no syncope or near-syncope. No fever chills Continue with occ dizziness but is "95% better" almost gone. Continue with some stress, work related  Past Medical History:  Diagnosis Date  . Disequilibrium   . ELEVATED BLOOD PRESSURE WITHOUT DIAGNOSIS OF HYPERTENSION 05/03/2008   Qualifier: Diagnosis of  By: Larose Kells MD, [facility] murmur    rcho (-)  . Hyperglycemia 11/09/2012  . HYPERLIPIDEMIA, MILD 04/02/2010   Qualifier: Diagnosis of  By: Larose Kells MD, [place] cataract   . Sensorineural hearing loss, bilateral 2016   Strict hearing loss precautions    Past Surgical History:  Procedure Laterality Date  . TONSILLECTOMY     age 89    Social History   Social History  . Marital status: Single    Spouse name: N/A  . Number of children: 2  . Years of education: N/A   Occupational History  . Psychologist, educational, works full time    Social History Main Topics  . Smoking status: Never Smoker  . Smokeless tobacco: Never Used  . Alcohol use 3.6 oz/week    6 Shots of liquor per week     Comment: vodka  . Drug use: No  . Sexual activity: Not on file   Other Topics Concern  . Not on file   Social History Narrative   Lives by himself   6 g-kids               Medication List    as of 02/12/2016  5:25 PM   You have not been prescribed any medications.        Objective:   Physical Exam BP 118/70 (BP Location: Left Arm, Patient Position: Sitting, Cuff Size: Small)   Pulse 64   Temp 97.5 F (36.4 C) (Oral)   Resp 12   Ht 5\' 8"   (1.727 m)   Wt 144 lb 4 oz (65.4 kg)   SpO2 98%   BMI 21.93 kg/m  General:   Well developed, well nourished . NAD.  HEENT:  Normocephalic . Face symmetric, atraumatic Lungs:  CTA B Normal respiratory effort, no intercostal retractions, no accessory muscle use. Heart: RRR,  + systolic murmur.  No pretibial edema bilaterally  Skin: Not pale. Not jaundice Neurologic:  alert & oriented X3.  Speech normal, gait appropriate for age and unassisted Psych--  Cognition and judgment appear intact.  Cooperative with normal attention span and concentration.  Behavior appropriate. No anxious or depressed appearing.      Assessment & Plan:    Assessment > Hyperglycemia Elevated BP without HTN Hyperlipidemia Ao stenosis , echo 2007, again 07-2015, slt more noticeable, recheck  In 1-2 years HOH  PLAN Elevated BP: Normal BP today. Dizziness: Essentially resolved. Aortic stenosis: Has a murmur, AoS documented by echo, no sx, recheck echo in one or 2 years. RTC 05-2016, CPX

## 2016-02-12 NOTE — Assessment & Plan Note (Signed)
Elevated BP: Normal BP today. Dizziness: Essentially resolved. Aortic stenosis: Has a murmur, AoS documented by echo, no sx, recheck echo in one or 2 years. RTC 05-2016, CPX

## 2016-02-12 NOTE — Progress Notes (Signed)
Pre visit review using our clinic review tool, if applicable. No additional management support is needed unless otherwise documented below in the visit note. 

## 2016-02-12 NOTE — Patient Instructions (Signed)
GO TO THE FRONT DESK Schedule your next appointment for a  Physical by 05-2016

## 2016-05-30 DIAGNOSIS — M9903 Segmental and somatic dysfunction of lumbar region: Secondary | ICD-10-CM | POA: Diagnosis not present

## 2016-05-30 DIAGNOSIS — M5416 Radiculopathy, lumbar region: Secondary | ICD-10-CM | POA: Diagnosis not present

## 2016-06-19 ENCOUNTER — Encounter: Payer: Medicare Other | Admitting: Internal Medicine

## 2016-06-19 ENCOUNTER — Telehealth: Payer: Self-pay | Admitting: Internal Medicine

## 2016-06-19 NOTE — Telephone Encounter (Signed)
No, thx 

## 2016-06-19 NOTE — Telephone Encounter (Signed)
Patient lvm at 7:29am cancelling he's 8:30am Physical appointment today, charge or no charge

## 2016-07-01 DIAGNOSIS — IMO0002 Reserved for concepts with insufficient information to code with codable children: Secondary | ICD-10-CM

## 2016-07-01 HISTORY — PX: OTHER SURGICAL HISTORY: SHX169

## 2016-07-01 HISTORY — DX: Reserved for concepts with insufficient information to code with codable children: IMO0002

## 2016-08-06 ENCOUNTER — Telehealth: Payer: Self-pay | Admitting: Internal Medicine

## 2016-08-06 NOTE — Telephone Encounter (Signed)
lvm advising patient to schedule medicare wellness appointment.  °

## 2016-12-02 ENCOUNTER — Telehealth: Payer: Self-pay | Admitting: Internal Medicine

## 2016-12-02 NOTE — Telephone Encounter (Signed)
Called pt to schedule awv. Pt stated that he would like to look at his schedule, then give the office a call back.

## 2016-12-11 DIAGNOSIS — E119 Type 2 diabetes mellitus without complications: Secondary | ICD-10-CM | POA: Diagnosis not present

## 2016-12-11 DIAGNOSIS — R42 Dizziness and giddiness: Secondary | ICD-10-CM | POA: Diagnosis not present

## 2016-12-11 DIAGNOSIS — Z139 Encounter for screening, unspecified: Secondary | ICD-10-CM | POA: Diagnosis not present

## 2016-12-11 DIAGNOSIS — N419 Inflammatory disease of prostate, unspecified: Secondary | ICD-10-CM | POA: Diagnosis not present

## 2016-12-11 DIAGNOSIS — J020 Streptococcal pharyngitis: Secondary | ICD-10-CM | POA: Diagnosis not present

## 2016-12-11 DIAGNOSIS — H6693 Otitis media, unspecified, bilateral: Secondary | ICD-10-CM | POA: Diagnosis not present

## 2016-12-11 DIAGNOSIS — E039 Hypothyroidism, unspecified: Secondary | ICD-10-CM | POA: Diagnosis not present

## 2016-12-11 DIAGNOSIS — E7800 Pure hypercholesterolemia, unspecified: Secondary | ICD-10-CM | POA: Diagnosis not present

## 2016-12-26 DIAGNOSIS — A6920 Lyme disease, unspecified: Secondary | ICD-10-CM | POA: Diagnosis not present

## 2017-01-08 DIAGNOSIS — E7800 Pure hypercholesterolemia, unspecified: Secondary | ICD-10-CM | POA: Diagnosis not present

## 2017-01-08 DIAGNOSIS — E119 Type 2 diabetes mellitus without complications: Secondary | ICD-10-CM | POA: Diagnosis not present

## 2017-01-08 DIAGNOSIS — A6920 Lyme disease, unspecified: Secondary | ICD-10-CM | POA: Diagnosis not present

## 2017-01-17 ENCOUNTER — Other Ambulatory Visit: Payer: Self-pay

## 2017-01-28 DIAGNOSIS — A6920 Lyme disease, unspecified: Secondary | ICD-10-CM | POA: Diagnosis not present

## 2017-02-03 DIAGNOSIS — A6920 Lyme disease, unspecified: Secondary | ICD-10-CM | POA: Diagnosis not present

## 2017-02-18 DIAGNOSIS — A6920 Lyme disease, unspecified: Secondary | ICD-10-CM | POA: Diagnosis not present

## 2017-03-01 HISTORY — PX: TRANSTHORACIC ECHOCARDIOGRAM: SHX275

## 2017-03-20 DIAGNOSIS — I509 Heart failure, unspecified: Secondary | ICD-10-CM | POA: Diagnosis not present

## 2017-03-20 DIAGNOSIS — E7800 Pure hypercholesterolemia, unspecified: Secondary | ICD-10-CM | POA: Diagnosis not present

## 2017-03-20 DIAGNOSIS — I350 Nonrheumatic aortic (valve) stenosis: Secondary | ICD-10-CM | POA: Diagnosis not present

## 2017-03-20 DIAGNOSIS — R011 Cardiac murmur, unspecified: Secondary | ICD-10-CM | POA: Diagnosis not present

## 2017-03-20 DIAGNOSIS — A6920 Lyme disease, unspecified: Secondary | ICD-10-CM | POA: Diagnosis not present

## 2017-03-24 ENCOUNTER — Ambulatory Visit (INDEPENDENT_AMBULATORY_CARE_PROVIDER_SITE_OTHER): Payer: Medicare Other | Admitting: Internal Medicine

## 2017-03-24 ENCOUNTER — Encounter: Payer: Self-pay | Admitting: Internal Medicine

## 2017-03-24 ENCOUNTER — Encounter: Payer: Self-pay | Admitting: *Deleted

## 2017-03-24 VITALS — BP 140/84 | HR 71 | Ht 70.0 in | Wt 153.0 lb

## 2017-03-24 DIAGNOSIS — R7989 Other specified abnormal findings of blood chemistry: Secondary | ICD-10-CM | POA: Diagnosis not present

## 2017-03-24 DIAGNOSIS — I350 Nonrheumatic aortic (valve) stenosis: Secondary | ICD-10-CM

## 2017-03-24 DIAGNOSIS — R030 Elevated blood-pressure reading, without diagnosis of hypertension: Secondary | ICD-10-CM

## 2017-03-24 DIAGNOSIS — R9431 Abnormal electrocardiogram [ECG] [EKG]: Secondary | ICD-10-CM | POA: Diagnosis not present

## 2017-03-24 NOTE — Progress Notes (Signed)
New Outpatient Visit Date: 03/24/2017  Referring Provider: Suzan Garibaldi, NP [address]. [place], [place] [address]  Chief Complaint: Abnormal BNP  HPI:  Mr. Summer is a 76 y.o. male who is being seen today for the evaluation of aortic stenosis and elevated BNP at the request of Ms. Wells. He has a history of aortic stenosis that was recently found to be moderate to severe by echo ordered by Dr. Rock Nephew (see details below). Mr. Zavadil reports feeling well. 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 He denies chest pain, shortness of breath, lightheadedness, palpitations, edema, and orthopnea.  Mr. Wray recently underwent evaluation to update his life insurance. As part of this, an elevated BNP was discovered. Repeat BNP remains elevated. He was advised to have a stress test done due to the abnormal lab result.  --------------------------------------------------------------------------------------------------  Cardiovascular History & Procedures: Cardiovascular Problems:  Aortic stenosis  Risk Factors:  Age > 55  Cath/PCI:  None  CV Surgery:  None  EP Procedures and Devices:  None  Non-Invasive Evaluation(s):  Echo (03/20/17, [facility]): Mild LVH with normal LV contraction (LVEF 50%). Severely calcified aortic valve with mild regurgitation and moderate-severe stenosis (AoV velocity 4.4 m/s, AVA 0.6 cm^2). Mitral calcification with mild MR. Mild pulmonary hypertension. Normal RV size and function.  Echo (07/19/15): Normal LV size with mild LVH. LVEF 55-60% with grade 2 diastolic dysfunction. Moderate AS and mild AI. MAC with mild MR. Mild LA enlargement. Normal RV size and function.  Recent CV Pertinent Labs: Lab Results  Component Value Date   CHOL 232 (H) 06/12/2015   HDL 63.70 06/12/2015   LDLCALC 148 (H) 06/12/2015   LDLDIRECT 146.5 11/09/2012   TRIG 99.0 06/12/2015   CHOLHDL 4 06/12/2015   K 3.8 12/02/2014   BUN 16 12/02/2014   CREATININE 1.07 12/02/2014   CREATININE 0.98 11/09/2012   --------------------------------------------------------------------------------------------------  Past Medical History:  Diagnosis Date  . Aortic stenosis   . Disequilibrium   . ELEVATED BLOOD PRESSURE WITHOUT DIAGNOSIS OF HYPERTENSION 05/03/2008   Qualifier: Diagnosis of  By: Larose Kells MD, [place] Hyperglycemia 11/09/2012  . HYPERLIPIDEMIA, MILD 04/02/2010   Qualifier: Diagnosis of  By: Larose Kells MD, [place] cataract   . Sensorineural hearing loss, bilateral 2016   Strict hearing loss precautions    Past Surgical History:  Procedure Laterality Date  . TONSILLECTOMY     age 52    Medications: None  Allergies: Patient has no known allergies.  Social History   Social History  . Marital status: Single    Spouse name: N/A  . Number of children: 2  . Years of education: N/A   Occupational History  . Psychologist, educational, works full time    Social History Main Topics  . Smoking status: Never Smoker  . Smokeless tobacco: Never Used  . Alcohol use 3.6 oz/week    6 Shots of liquor per week     Comment: vodka  . Drug use: No  . Sexual activity: Not on file   Other Topics Concern  . Not on file   Social History Narrative   Lives by himself   6 g-kids           Family History  Problem Relation Age of Onset  . Diabetes Other        GM  . CAD Brother  CABG at age 81  . Stroke Mother        age 90  . Colon cancer Neg Hx   . Prostate cancer Neg Hx     Review of Systems: A 12-system review of systems was performed and was negative except as noted in the HPI.  --------------------------------------------------------------------------------------------------  Physical Exam: BP 140/84   Pulse 71   Ht 5' 10"  (1.778 m)   Wt 153 lb (69.4 kg)   SpO2 97%   BMI 21.95 kg/m   General:  Thin man, seated comfortably in the exam room. HEENT: No conjunctival pallor  or scleral icterus. Moist mucous membranes. OP clear. Neck: Supple without lymphadenopathy, thyromegaly, JVD, or HJR. No carotid bruit. Lungs: Normal work of breathing. Clear to auscultation bilaterally without wheezes or crackles. Heart: Regular rate and rhythm harsh 2/6 crescendo-decrescendo systolic murmur loudest at the RUSB but heart throughout the precordium and radiating to the carotids. No rubs or gallops. S2 is indistinct. Abd: Bowel sounds present. Soft, NT/ND without hepatosplenomegaly Ext: No lower extremity edema. Radial, PT, and DP pulses are 2+ bilaterally Skin: Warm and dry without rash. Neuro: CNIII-XII intact. Strength and fine-touch sensation intact in upper and lower extremities bilaterally. Psych: Normal mood and affect.  EKG:  NSR with left axis deviation and non-specific ST-segment changes.  Lab Results  Component Value Date   WBC 6.6 12/02/2014   HGB 14.3 12/02/2014   HCT 42.1 12/02/2014   MCV 86.7 12/02/2014   PLT 232.0 12/02/2014    Lab Results  Component Value Date   NA 137 12/02/2014   K 3.8 12/02/2014   CL 104 12/02/2014   CO2 27 12/02/2014   BUN 16 12/02/2014   CREATININE 1.07 12/02/2014   GLUCOSE 109 (H) 12/02/2014   ALT 9 11/09/2012    Lab Results  Component Value Date   CHOL 232 (H) 06/12/2015   HDL 63.70 06/12/2015   LDLCALC 148 (H) 06/12/2015   LDLDIRECT 146.5 11/09/2012   TRIG 99.0 06/12/2015   CHOLHDL 4 06/12/2015   --------------------------------------------------------------------------------------------------  ASSESSMENT AND PLAN: Aortic stenosis and abnormal EKG Recent outside echo reports moderate to severe AS, though peak aortic valve velocity of 4.4 m/s is consistent with severe AS. Mr. Choyce does not have any symptoms of severe aortic stenosis. We discussed watchful waiting versus further evaluation (including stress testing and cardiac catheterization). Mr. Pridgeon would like to proceed with stress testing, which is  reasonable in order to objectively assess his exercise capacity and blood pressure response. We will avoid regadenoson  Elevated BNP This is most likely related to LVH and diastolic dysfunction in the setting of aortic stenosis. Mr. Antony appears euvolemic and well-compensated. We will not add any medications today.  Elevated blood pressure Mr. Osmon denies a history of hypertension, though past BP readings have been somewhat high. I will defer adding any medications today.  Follow-up: Return to clinic in 1 month.  Nelva Bush, MD 03/24/2017 3:17 PM

## 2017-03-24 NOTE — Patient Instructions (Signed)
Medication Instructions:  Your physician recommends that you continue on your current medications as directed. Please refer to the Current Medication list given to you today.   Labwork: None   Testing/Procedures: Your physician has requested that you have en exercise stress myoview. For further information please visit [email or web address]. Please follow instruction sheet, as given.    Follow-Up: Your physician recommends that you schedule a follow-up appointment in: 4 weeks with Dr End.        If you need a refill on your cardiac medications before your next appointment, please call your pharmacy.

## 2017-03-26 ENCOUNTER — Telehealth (HOSPITAL_COMMUNITY): Payer: Self-pay | Admitting: *Deleted

## 2017-03-26 NOTE — Telephone Encounter (Signed)
Patient given detailed instructions per Myocardial Perfusion Study Information Sheet for the test on 03/31/17. Patient notified to arrive 15 minutes early and that it is imperative to arrive on time for appointment to keep from having the test rescheduled.  If you need to cancel or reschedule your appointment, please call the office within 24 hours of your appointment. . Patient verbalized understanding. Cesar Bullock    

## 2017-03-27 DIAGNOSIS — A6920 Lyme disease, unspecified: Secondary | ICD-10-CM | POA: Diagnosis not present

## 2017-03-27 DIAGNOSIS — I350 Nonrheumatic aortic (valve) stenosis: Secondary | ICD-10-CM | POA: Diagnosis not present

## 2017-03-27 DIAGNOSIS — R011 Cardiac murmur, unspecified: Secondary | ICD-10-CM | POA: Diagnosis not present

## 2017-03-31 ENCOUNTER — Encounter (HOSPITAL_COMMUNITY): Payer: Medicare Other

## 2017-04-11 ENCOUNTER — Ambulatory Visit: Payer: Medicare Other | Admitting: Internal Medicine

## 2017-11-10 DIAGNOSIS — E119 Type 2 diabetes mellitus without complications: Secondary | ICD-10-CM | POA: Diagnosis not present

## 2017-11-10 DIAGNOSIS — E7800 Pure hypercholesterolemia, unspecified: Secondary | ICD-10-CM | POA: Diagnosis not present

## 2017-11-10 DIAGNOSIS — Z139 Encounter for screening, unspecified: Secondary | ICD-10-CM | POA: Diagnosis not present

## 2017-11-10 DIAGNOSIS — E039 Hypothyroidism, unspecified: Secondary | ICD-10-CM | POA: Diagnosis not present

## 2017-11-10 DIAGNOSIS — N419 Inflammatory disease of prostate, unspecified: Secondary | ICD-10-CM | POA: Diagnosis not present

## 2017-11-11 DIAGNOSIS — E7800 Pure hypercholesterolemia, unspecified: Secondary | ICD-10-CM | POA: Diagnosis not present

## 2017-11-11 DIAGNOSIS — M1990 Unspecified osteoarthritis, unspecified site: Secondary | ICD-10-CM | POA: Diagnosis not present

## 2017-12-24 DIAGNOSIS — H35342 Macular cyst, hole, or pseudohole, left eye: Secondary | ICD-10-CM | POA: Diagnosis not present

## 2017-12-24 DIAGNOSIS — H524 Presbyopia: Secondary | ICD-10-CM | POA: Diagnosis not present

## 2017-12-24 DIAGNOSIS — H26493 Other secondary cataract, bilateral: Secondary | ICD-10-CM | POA: Diagnosis not present

## 2018-01-05 DIAGNOSIS — I10 Essential (primary) hypertension: Secondary | ICD-10-CM | POA: Diagnosis not present

## 2018-01-05 DIAGNOSIS — A6920 Lyme disease, unspecified: Secondary | ICD-10-CM | POA: Diagnosis not present

## 2018-06-21 ENCOUNTER — Encounter: Payer: Self-pay | Admitting: Gastroenterology

## 2018-10-14 DIAGNOSIS — H35373 Puckering of macula, bilateral: Secondary | ICD-10-CM | POA: Diagnosis not present

## 2018-10-14 DIAGNOSIS — H43822 Vitreomacular adhesion, left eye: Secondary | ICD-10-CM | POA: Diagnosis not present

## 2019-08-06 DIAGNOSIS — E038 Other specified hypothyroidism: Secondary | ICD-10-CM | POA: Diagnosis not present

## 2019-08-06 DIAGNOSIS — E7800 Pure hypercholesterolemia, unspecified: Secondary | ICD-10-CM | POA: Diagnosis not present

## 2019-08-06 DIAGNOSIS — E291 Testicular hypofunction: Secondary | ICD-10-CM | POA: Diagnosis not present

## 2019-08-06 DIAGNOSIS — Z0000 Encounter for general adult medical examination without abnormal findings: Secondary | ICD-10-CM | POA: Diagnosis not present

## 2019-08-06 DIAGNOSIS — E039 Hypothyroidism, unspecified: Secondary | ICD-10-CM | POA: Diagnosis not present

## 2019-08-06 DIAGNOSIS — Z20828 Contact with and (suspected) exposure to other viral communicable diseases: Secondary | ICD-10-CM | POA: Diagnosis not present

## 2019-08-06 DIAGNOSIS — Z139 Encounter for screening, unspecified: Secondary | ICD-10-CM | POA: Diagnosis not present

## 2019-08-06 DIAGNOSIS — E119 Type 2 diabetes mellitus without complications: Secondary | ICD-10-CM | POA: Diagnosis not present

## 2019-08-06 DIAGNOSIS — N419 Inflammatory disease of prostate, unspecified: Secondary | ICD-10-CM | POA: Diagnosis not present

## 2019-08-12 DIAGNOSIS — Z20828 Contact with and (suspected) exposure to other viral communicable diseases: Secondary | ICD-10-CM | POA: Diagnosis not present

## 2019-08-12 DIAGNOSIS — U071 COVID-19: Secondary | ICD-10-CM

## 2019-08-12 HISTORY — DX: COVID-19: U07.1

## 2019-08-18 DIAGNOSIS — Z20828 Contact with and (suspected) exposure to other viral communicable diseases: Secondary | ICD-10-CM | POA: Diagnosis not present

## 2019-08-25 DIAGNOSIS — Z20822 Contact with and (suspected) exposure to covid-19: Secondary | ICD-10-CM | POA: Diagnosis not present

## 2019-08-26 DIAGNOSIS — U071 COVID-19: Secondary | ICD-10-CM | POA: Diagnosis not present

## 2019-08-27 ENCOUNTER — Ambulatory Visit: Payer: Medicare Other | Attending: Internal Medicine

## 2019-08-27 DIAGNOSIS — Z23 Encounter for immunization: Secondary | ICD-10-CM | POA: Insufficient documentation

## 2019-08-27 NOTE — Progress Notes (Signed)
   Covid-19 Vaccination Clinic  Name:  Cesar Bullock    MRN: [id number] DOB: 1940/11/30  08/27/2019  Mr. [name] was observed post Covid-19 immunization for 15 minutes without incidence. He was provided with Vaccine Information Sheet and instruction to access the V-Safe system.   Mr. [name] was instructed to call 911 with any severe reactions post vaccine: Marland Kitchen Difficulty breathing  . Swelling of your face and throat  . A fast heartbeat  . A bad rash all over your body  . Dizziness and weakness    Immunizations Administered    Name Date Dose VIS Date Route   Pfizer COVID-19 Vaccine 08/27/2019  9:10 AM 0.3 mL 06/11/2019 Intramuscular   Manufacturer: [place]   Lot: [id number]   [place]: [id number]

## 2019-09-21 ENCOUNTER — Ambulatory Visit: Payer: Medicare Other | Attending: Internal Medicine

## 2019-09-21 DIAGNOSIS — Z23 Encounter for immunization: Secondary | ICD-10-CM

## 2019-09-21 NOTE — Progress Notes (Signed)
   Covid-19 Vaccination Clinic  Name:  Cesar Bullock    MRN: [id number] DOB: Jul 20, 1940  09/21/2019  Cesar Bullock was observed post Covid-19 immunization for 15 minutes without incident. He was provided with Vaccine Information Sheet and instruction to access the V-Safe system.   Cesar Bullock was instructed to call 911 with any severe reactions post vaccine: Marland Kitchen Difficulty breathing  . Swelling of face and throat  . A fast heartbeat  . A bad rash all over body  . Dizziness and weakness   Immunizations Administered    Name Date Dose VIS Date Route   Pfizer COVID-19 Vaccine 09/21/2019  4:40 PM 0.3 mL 06/11/2019 Intramuscular   Manufacturer: [place]   Lot: [id number]   [place]: [id number]

## 2019-12-13 DIAGNOSIS — J209 Acute bronchitis, unspecified: Secondary | ICD-10-CM | POA: Diagnosis not present

## 2019-12-13 DIAGNOSIS — H6502 Acute serous otitis media, left ear: Secondary | ICD-10-CM | POA: Diagnosis not present

## 2019-12-13 DIAGNOSIS — J9801 Acute bronchospasm: Secondary | ICD-10-CM | POA: Diagnosis not present

## 2019-12-13 DIAGNOSIS — R0602 Shortness of breath: Secondary | ICD-10-CM | POA: Diagnosis not present

## 2020-01-04 ENCOUNTER — Telehealth: Payer: Self-pay

## 2020-01-04 ENCOUNTER — Telehealth: Payer: Self-pay | Admitting: Internal Medicine

## 2020-01-04 NOTE — Telephone Encounter (Signed)
Patient use to be a patient of Dr. Larose Kells . Patient hasn't been seen since 2017. Patient would like to re establish with provider . Please advise .

## 2020-01-04 NOTE — Telephone Encounter (Signed)
I am not currently accepting new patients.

## 2020-01-04 NOTE — Telephone Encounter (Signed)
   Are you willing to establish care with Cesar Bullock? He would like to become a new patient.

## 2020-01-04 NOTE — Telephone Encounter (Signed)
Please advise 

## 2020-01-05 NOTE — Telephone Encounter (Signed)
   Patient made aware Dr Quay Burow unable to accept him. Offered appointment with NP, patient declined

## 2020-01-06 DIAGNOSIS — E7800 Pure hypercholesterolemia, unspecified: Secondary | ICD-10-CM | POA: Diagnosis not present

## 2020-01-06 DIAGNOSIS — R0602 Shortness of breath: Secondary | ICD-10-CM | POA: Diagnosis not present

## 2020-01-06 DIAGNOSIS — J209 Acute bronchitis, unspecified: Secondary | ICD-10-CM | POA: Diagnosis not present

## 2020-01-06 DIAGNOSIS — J9801 Acute bronchospasm: Secondary | ICD-10-CM | POA: Diagnosis not present

## 2020-01-06 DIAGNOSIS — Z139 Encounter for screening, unspecified: Secondary | ICD-10-CM | POA: Diagnosis not present

## 2020-01-06 DIAGNOSIS — E038 Other specified hypothyroidism: Secondary | ICD-10-CM | POA: Diagnosis not present

## 2020-01-06 DIAGNOSIS — E039 Hypothyroidism, unspecified: Secondary | ICD-10-CM | POA: Diagnosis not present

## 2020-01-06 DIAGNOSIS — E0789 Other specified disorders of thyroid: Secondary | ICD-10-CM | POA: Diagnosis not present

## 2020-01-06 NOTE — Telephone Encounter (Signed)
Please let Pt know of Dr. Ethel Rana decision. TY.

## 2020-01-06 NOTE — Telephone Encounter (Signed)
Called patient to inform him , left message on phone .

## 2020-01-06 NOTE — Telephone Encounter (Signed)
decline

## 2020-01-11 DIAGNOSIS — Z20822 Contact with and (suspected) exposure to covid-19: Secondary | ICD-10-CM | POA: Diagnosis not present

## 2020-01-13 ENCOUNTER — Other Ambulatory Visit: Payer: Self-pay

## 2020-01-13 ENCOUNTER — Ambulatory Visit (HOSPITAL_COMMUNITY)
Admission: EM | Admit: 2020-01-13 | Discharge: 2020-01-13 | Discharge status: Absent without leave | Payer: Medicare Other

## 2020-01-13 ENCOUNTER — Ambulatory Visit (INDEPENDENT_AMBULATORY_CARE_PROVIDER_SITE_OTHER): Payer: Medicare Other

## 2020-01-13 ENCOUNTER — Encounter (HOSPITAL_COMMUNITY): Payer: Self-pay

## 2020-01-13 ENCOUNTER — Ambulatory Visit (HOSPITAL_COMMUNITY)
Admission: RE | Admit: 2020-01-13 | Discharge: 2020-01-13 | Disposition: A | Discharge status: Home / Self Care | Payer: Medicare Other | Source: Ambulatory Visit | Attending: Urgent Care | Admitting: Urgent Care

## 2020-01-13 VITALS — BP 150/92 | HR 85 | Temp 98.7°F | Resp 18

## 2020-01-13 DIAGNOSIS — J9811 Atelectasis: Secondary | ICD-10-CM | POA: Diagnosis not present

## 2020-01-13 DIAGNOSIS — R05 Cough: Secondary | ICD-10-CM

## 2020-01-13 DIAGNOSIS — R0602 Shortness of breath: Secondary | ICD-10-CM

## 2020-01-13 DIAGNOSIS — J90 Pleural effusion, not elsewhere classified: Secondary | ICD-10-CM | POA: Diagnosis not present

## 2020-01-13 DIAGNOSIS — R059 Cough, unspecified: Secondary | ICD-10-CM

## 2020-01-13 NOTE — Discharge Instructions (Signed)
You are in need of a higher level of care than we can provide in the urgent care setting given your cough, shortness of breath and chest x-ray findings. Please report to the emergency room now for management of bilateral pleural effusions with loculations, bilateral bibasilar atelectasis.

## 2020-01-13 NOTE — ED Provider Notes (Signed)
[place]   MRN: [id number] DOB: 12-15-1940  Subjective:   Cesar Bullock is a 79 y.o. male presenting for 1-1.5 month history of persistent dry cough, shortness of breath.  Patient recently saw a nurse practitioner in [place], was prescribed what I suspect is azithromycin.  He completed a 5-day course of this earlier this week.  He was also prescribed prednisone course but started this only today.  Denies fever, headache, sinus congestion, throat pain, ear pain, sore throat, chest pain, dizziness, wheezing, belly pain.  Denies history of smoking.  Denies history of COPD, asthma, heart disease, CHF.  Patient had negative COVID-19 test a few days ago.  He has had Covid vaccination completed back in March 2021.  Patient did have COVID-19 August 2018.  No current facility-administered medications for this encounter. No current outpatient medications on file.   No Known Allergies  Past Medical History:  Diagnosis Date  . Aortic stenosis   . Disequilibrium   . ELEVATED BLOOD PRESSURE WITHOUT DIAGNOSIS OF HYPERTENSION 05/03/2008   Qualifier: Diagnosis of  By: Larose Kells MD, [place] Hyperglycemia 11/09/2012  . HYPERLIPIDEMIA, MILD 04/02/2010   Qualifier: Diagnosis of  By: Larose Kells MD, [place] cataract   . Sensorineural hearing loss, bilateral 2016   Strict hearing loss precautions     Past Surgical History:  Procedure Laterality Date  . TONSILLECTOMY     age 12    Family History  Problem Relation Age of Onset  . Diabetes Other        GM  . CAD Brother        CABG at age 55  . Stroke Mother        age 84  . Colon cancer Neg Hx   . Prostate cancer Neg Hx     Social History   Tobacco Use  . Smoking status: Never Smoker  . Smokeless tobacco: Never Used  Substance Use Topics  . Alcohol use: Yes    Alcohol/week: 6.0 standard drinks    Types: 6 Shots of liquor per week    Comment: vodka  . Drug use: No    ROS   Objective:   Vitals: BP (!) 150/92 (BP  Location: Left Arm)   Pulse 85   Temp 98.7 F (37.1 C) (Oral)   Resp 18   SpO2 97%   Physical Exam Constitutional:      General: He is not in acute distress.    Appearance: Normal appearance. He is well-developed. He is not ill-appearing, toxic-appearing or diaphoretic.  HENT:     Head: Normocephalic and atraumatic.     Right Ear: External ear normal.     Left Ear: External ear normal.     Nose: Nose normal.     Mouth/Throat:     Mouth: Mucous membranes are moist.     Pharynx: Oropharynx is clear.  Eyes:     General: No scleral icterus.    Extraocular Movements: Extraocular movements intact.     Pupils: Pupils are equal, round, and reactive to light.  Cardiovascular:     Rate and Rhythm: Normal rate and regular rhythm.     Heart sounds: Normal heart sounds. No murmur heard.  No friction rub. No gallop.   Pulmonary:     Effort: Pulmonary effort is normal. No respiratory distress.     Breath sounds: Normal breath sounds. No stridor. No wheezing, rhonchi or rales.  Skin:    General: Skin is  warm and dry.  Neurological:     Mental Status: He is alert and oriented to person, place, and time.  Psychiatric:        Mood and Affect: Mood normal.        Behavior: Behavior normal.        Thought Content: Thought content normal.     DG Chest 2 View  Result Date: 01/13/2020 CLINICAL DATA:  Shortness of breath and cough EXAM: CHEST - 2 VIEW COMPARISON:  None. FINDINGS: There are small pleural effusions bilaterally with apparent loculation in each lung base. There is bibasilar atelectasis. Lungs elsewhere are clear. Heart is upper normal in size with pulmonary vascularity normal. No adenopathy. There is aortic atherosclerosis. There is degenerative change in the thoracic spine IMPRESSION: Small loculated pleural effusions bilaterally with bibasilar atelectasis. Lungs elsewhere clear. Heart upper normal in size. No appreciable adenopathy. Aortic Atherosclerosis (ICD10-I70.0).  Electronically Signed   By: Lowella Grip III M.D.   On: 01/13/2020 13:01    Assessment and Plan :   PDMP not reviewed this encounter.  1. Cough   2. SOB (shortness of breath)   3. Bilateral pleural effusion   4. Atelectasis     Discussed x-ray findings with patient.  He is in need of a higher level of care than we can provide in the urgent care setting.  Emphasized need for evaluation and further management of bilateral loculated pleural effusions with bibasilar atelectasis.  Patient verbalized understanding, states that he is going to talk to his family prior to heading to the emergency room.   Jaynee Eagles, PA-C 01/13/20 1318

## 2020-01-13 NOTE — ED Triage Notes (Signed)
Pt presents with SOB and cough x 1.5 months.  SOB is intermittent with worsening episodes in afternoon and evening.  Denies CP, nausea, dizziness.  States he is under a lot of stress and is having difficulty sleeping d/t stress. Cough is non-productive. Has been steady (not worsening) in the 1.5 mos.  Recently finished an abx and started prednisone this morning.  Also has an inhaler that is almost empty. States the inhaler does not help. Pt states he wants chest xray and labs for a physical.  Advised he will need to be eval'ed by MD for tests appropriate for visit.   Pt does not have names of meds recently prescribed.  No long term/chronic meds.   Pt had negative COVID test on 01/11/2020.

## 2020-01-14 DIAGNOSIS — E119 Type 2 diabetes mellitus without complications: Secondary | ICD-10-CM | POA: Diagnosis not present

## 2020-01-14 DIAGNOSIS — R42 Dizziness and giddiness: Secondary | ICD-10-CM | POA: Diagnosis not present

## 2020-01-14 DIAGNOSIS — R0602 Shortness of breath: Secondary | ICD-10-CM | POA: Diagnosis not present

## 2020-01-14 DIAGNOSIS — E7800 Pure hypercholesterolemia, unspecified: Secondary | ICD-10-CM | POA: Diagnosis not present

## 2020-01-14 DIAGNOSIS — R05 Cough: Secondary | ICD-10-CM | POA: Diagnosis not present

## 2020-01-14 DIAGNOSIS — N419 Inflammatory disease of prostate, unspecified: Secondary | ICD-10-CM | POA: Diagnosis not present

## 2020-01-14 DIAGNOSIS — Z139 Encounter for screening, unspecified: Secondary | ICD-10-CM | POA: Diagnosis not present

## 2020-01-14 DIAGNOSIS — J189 Pneumonia, unspecified organism: Secondary | ICD-10-CM | POA: Diagnosis not present

## 2020-01-17 DIAGNOSIS — R0602 Shortness of breath: Secondary | ICD-10-CM | POA: Diagnosis not present

## 2020-01-17 DIAGNOSIS — J189 Pneumonia, unspecified organism: Secondary | ICD-10-CM | POA: Diagnosis not present

## 2020-01-17 DIAGNOSIS — R05 Cough: Secondary | ICD-10-CM | POA: Diagnosis not present

## 2020-01-17 DIAGNOSIS — R42 Dizziness and giddiness: Secondary | ICD-10-CM | POA: Diagnosis not present

## 2020-01-18 ENCOUNTER — Ambulatory Visit (HOSPITAL_COMMUNITY)
Admission: RE | Admit: 2020-01-18 | Discharge: 2020-01-18 | Disposition: A | Discharge status: Home / Self Care | Payer: Medicare Other | Source: Ambulatory Visit | Attending: Nurse Practitioner | Admitting: Nurse Practitioner

## 2020-01-18 ENCOUNTER — Other Ambulatory Visit (HOSPITAL_COMMUNITY): Payer: Self-pay | Admitting: Nurse Practitioner

## 2020-01-18 DIAGNOSIS — J189 Pneumonia, unspecified organism: Secondary | ICD-10-CM

## 2020-01-18 DIAGNOSIS — R05 Cough: Secondary | ICD-10-CM | POA: Diagnosis not present

## 2020-01-18 DIAGNOSIS — J9811 Atelectasis: Secondary | ICD-10-CM | POA: Diagnosis not present

## 2020-01-18 DIAGNOSIS — R0602 Shortness of breath: Secondary | ICD-10-CM | POA: Diagnosis not present

## 2020-01-18 DIAGNOSIS — I517 Cardiomegaly: Secondary | ICD-10-CM | POA: Diagnosis not present

## 2020-01-18 DIAGNOSIS — R42 Dizziness and giddiness: Secondary | ICD-10-CM | POA: Diagnosis not present

## 2020-01-18 DIAGNOSIS — J90 Pleural effusion, not elsewhere classified: Secondary | ICD-10-CM | POA: Diagnosis not present

## 2020-01-24 DIAGNOSIS — Z20822 Contact with and (suspected) exposure to covid-19: Secondary | ICD-10-CM | POA: Diagnosis not present

## 2020-01-27 DIAGNOSIS — Z1331 Encounter for screening for depression: Secondary | ICD-10-CM | POA: Diagnosis not present

## 2020-01-27 DIAGNOSIS — I119 Hypertensive heart disease without heart failure: Secondary | ICD-10-CM | POA: Diagnosis not present

## 2020-01-27 DIAGNOSIS — R0602 Shortness of breath: Secondary | ICD-10-CM | POA: Diagnosis not present

## 2020-01-27 DIAGNOSIS — R5383 Other fatigue: Secondary | ICD-10-CM | POA: Diagnosis not present

## 2020-01-27 DIAGNOSIS — R945 Abnormal results of liver function studies: Secondary | ICD-10-CM | POA: Diagnosis not present

## 2020-01-27 DIAGNOSIS — R7989 Other specified abnormal findings of blood chemistry: Secondary | ICD-10-CM | POA: Diagnosis not present

## 2020-01-27 DIAGNOSIS — I350 Nonrheumatic aortic (valve) stenosis: Secondary | ICD-10-CM | POA: Diagnosis not present

## 2020-01-30 DIAGNOSIS — I5042 Chronic combined systolic (congestive) and diastolic (congestive) heart failure: Secondary | ICD-10-CM

## 2020-01-30 DIAGNOSIS — I428 Other cardiomyopathies: Secondary | ICD-10-CM

## 2020-01-30 HISTORY — DX: Chronic combined systolic (congestive) and diastolic (congestive) heart failure: I50.42

## 2020-01-30 HISTORY — DX: Other cardiomyopathies: I42.8

## 2020-02-01 ENCOUNTER — Telehealth: Payer: Self-pay | Admitting: Cardiology

## 2020-02-01 NOTE — Telephone Encounter (Signed)
Did not need this encounter °

## 2020-02-07 ENCOUNTER — Ambulatory Visit (INDEPENDENT_AMBULATORY_CARE_PROVIDER_SITE_OTHER): Payer: Medicare Other | Admitting: Cardiology

## 2020-02-07 ENCOUNTER — Encounter: Payer: Self-pay | Admitting: Cardiology

## 2020-02-07 ENCOUNTER — Other Ambulatory Visit: Payer: Self-pay

## 2020-02-07 VITALS — BP 157/99 | HR 106 | Ht 70.0 in | Wt 147.2 lb

## 2020-02-07 DIAGNOSIS — R9431 Abnormal electrocardiogram [ECG] [EKG]: Secondary | ICD-10-CM | POA: Diagnosis not present

## 2020-02-07 DIAGNOSIS — R600 Localized edema: Secondary | ICD-10-CM

## 2020-02-07 DIAGNOSIS — E785 Hyperlipidemia, unspecified: Secondary | ICD-10-CM

## 2020-02-07 DIAGNOSIS — R030 Elevated blood-pressure reading, without diagnosis of hypertension: Secondary | ICD-10-CM | POA: Diagnosis not present

## 2020-02-07 DIAGNOSIS — R0600 Dyspnea, unspecified: Secondary | ICD-10-CM | POA: Diagnosis not present

## 2020-02-07 DIAGNOSIS — I350 Nonrheumatic aortic (valve) stenosis: Secondary | ICD-10-CM | POA: Diagnosis not present

## 2020-02-07 DIAGNOSIS — R0609 Other forms of dyspnea: Secondary | ICD-10-CM

## 2020-02-07 DIAGNOSIS — I509 Heart failure, unspecified: Secondary | ICD-10-CM | POA: Diagnosis not present

## 2020-02-07 MED ORDER — CARVEDILOL 3.125 MG PO TABS
3.1250 mg | ORAL_TABLET | Freq: Two times a day (BID) | ORAL | 0 refills | Status: DC
Start: 2020-02-07 — End: 2020-02-10

## 2020-02-07 MED ORDER — FUROSEMIDE 20 MG PO TABS
20.0000 mg | ORAL_TABLET | Freq: Every day | ORAL | 3 refills | Status: DC
Start: 2020-02-07 — End: 2020-06-06

## 2020-02-07 MED ORDER — CARVEDILOL 3.125 MG PO TABS
3.1250 mg | ORAL_TABLET | Freq: Two times a day (BID) | ORAL | 3 refills | Status: DC
Start: 2020-02-07 — End: 2020-03-07

## 2020-02-07 NOTE — Patient Instructions (Signed)
Medication Instructions:   start  Furosemide 40 mg ( 2 tablets ) for 3 days  Then decrease to 20 mg daily   Start carvedilol 3.125 mg  1 tablet  Twice daily     *If you need a refill on your cardiac medications before your next appointment, please call your pharmacy*   Lab Work: Not needed If you have labs (blood work) drawn today and your tests are completely normal, you will receive your results only by: Marland Kitchen MyChart Message (if you have MyChart) OR . A paper copy in the mail If you have any lab test that is abnormal or we need to change your treatment, we will call you to review the results.   Testing/Procedures:Your physician has requested that you have an echocardiogram. Echocardiography is a painless test that uses sound waves to create images of your heart. It provides your doctor with information about the size and shape of your heart and how well your heart's chambers and valves are working. This procedure takes approximately one hour. There are no restrictions for this procedure.     Follow-Up: At [name], you and your health needs are our priority.  As part of our continuing mission to provide you with exceptional heart care, we have created designated Provider Care Teams.  These Care Teams include your primary Cardiologist (physician) and Advanced Practice Providers (APPs -  Physician Assistants and Nurse Practitioners) who all work together to provide you with the care you need, when you need it.  We recommend signing up for the patient portal called "MyChart".  Sign up information is provided on this After Visit Summary.  MyChart is used to connect with patients for Virtual Visits (Telemedicine).  Patients are able to view lab/test results, encounter notes, upcoming appointments, etc.  Non-urgent messages can be sent to your provider as well.   To learn more about what you can do with MyChart, go to [email or web address].    Your next appointment:   1 week(s)  The  format for your next appointment:   In Person  Provider:   Glenetta Hew, MD   Other Instructions

## 2020-02-07 NOTE — Progress Notes (Addendum)
Primary Care Provider: Michael Boston, MD Cardiologist: Glenetta Hew, MD Electrophysiologist: None  Clinic Note: Chief Complaint  Patient presents with  . Follow-up    Almost 3 years-aortic stenosis  . Shortness of Breath    And fatigue  . Edema   HPI:    Cesar Bullock is a 79 y.o. male who is being seen today for the evaluation of history of AORTIC STENOSIS, SHORTNESS OF BREATH, LEG SWELLING at the request of Wile, Jesse Sans, MD.  Newell Wafer Bergin was last seen on 2018 by Dr. Harrell Gave End for evaluation of abnormal BNP and aortic stenosis.  (See echo below).  He is feeling well.  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 -->  Recommendation was stress test versus cardiac cath.  He had plan to do stress test.  Plan was 1 month follow-up.  Recent Hospitalizations:  01/13/2020-[facility]: 1-1.43-monthhistory of persistent dry cough and dyspnea.  Was prescribed azithromycin followed by prednisone no fever or sinus congestion.  COVID-19 negative.  CXR negative for pneumonia--was sent to ER because of pleural effusions  Reviewed  CV studies:    The following studies were reviewed today: (if available, images/films reviewed: From Epic Chart or Care Everywhere)  [facility] Mild LVH with normal LV contraction (LVEF 50%). Severely calcified aortic valve with mild regurgitation and moderate-severe stenosis (AoV velocity 4.4 m/s, AVA 0.6 cm^2). Mitral calcification with mild MR. Mild pulmonary hypertension. Normal RV size and function.  Echo (07/19/15): Normal LV size with mild LVH. LVEF 55-60% with grade 2 diastolic dysfunction. Moderate AS and mild AI. MAC with mild MR. Mild LA enlargement. Normal RV size and function.  Immunization History  Administered Date(s) Administered  . PFIZER SARS-COV-2 Vaccination 08/27/2019, 09/21/2019   Interval History:   EClent DamoreCumins presents here now for worsening edema, cough and dyspnea.  He  has had significant edema for the last 2-3 months, but got worse about 4 to 6 weeks ago.  He is noted increased abdominal girth as well as swelling.  He does not necessarily note that he is gaining weight per se but has noted that he is short of breath now all the time.  It certainly exacerbated with exertion, but he is short of breath at rest.  Does not indicate that he has symptoms of orthopnea/PND but has had cough that is worse with lying down.  No chest pain symptoms or palpitations.  CV Review of Symptoms (Summary) Cardiovascular ROS: positive for - edema, rapid heart rate, shortness of breath and Increased cough, worse with lying down negative for - chest pain, irregular heartbeat, palpitations, paroxysmal nocturnal dyspnea or Although he may have some lightheadedness, he denies any syncope or near syncope, TIA/amaurosis fugax.  Not really walking enough now to note claudication.  The patient does not have symptoms concerning for COVID-19 infection (fever, chills, cough, or new shortness of breath).  The patient is practicing social distancing & Masking.   08/12/2019: SARS-CoV-2 NAA detected (nucleic acid implication test) ([facility])  08/18/2019: Covid + ([facility])  Follow-up tests on 08/25/2019 ([facility])-not detected  01/11/2020, 01/24/2020 SARS-CoV2 RNA-negative   REVIEWED OF SYSTEMS   Review of Systems  Constitutional: Positive for malaise/fatigue. Negative for chills, fever and weight loss (He is not really sure).  HENT: Negative for congestion and nosebleeds.   Respiratory: Positive for shortness of breath. Negative for cough and wheezing.   Cardiovascular: Positive for leg swelling.  Gastrointestinal: Negative for abdominal pain, blood in stool and melena.  Genitourinary: Negative for frequency and hematuria.  Musculoskeletal: Positive for joint pain. Negative for falls.  Neurological: Positive for dizziness (A little bit of lightheadedness) and weakness (General). Negative for  focal weakness and seizures.  Endo/Heme/Allergies: Negative for environmental allergies.  Psychiatric/Behavioral: Negative for depression and memory loss. The patient is not nervous/anxious and does not have insomnia.     I have reviewed and (if needed) personally updated the patient's problem list, medications, allergies, past medical and surgical history, social and family history.   PAST MEDICAL HISTORY   Past Medical History:  Diagnosis Date  . 2019 novel coronavirus disease (COVID-19) 08/12/2019   -Follow-up test on August 25, 2019 not detected.  Again not detected  both July 13 and 26, 2021  . Aortic stenosis 03/20/2017   Mild LVH.  EF~50%.  Severely calcified aortic valve-moderate AS (AoV velocity 4.4 m/s, AVA~0.6 cm; with mild AI.  MAC with mild MR.  Mild pulmonary hypertension but normal RV size and function.  . Disequilibrium   . Elevated blood pressure reading without diagnosis of hypertension 05/03/2008   Qualifier: Diagnosis of  By: Larose Kells MD, Antares Hyperglycemia 11/09/2012  . HYPERLIPIDEMIA, MILD 04/02/2010   Qualifier: Diagnosis of  By: Larose Kells MD, Alda Berthold Nuclear cataract 2018   Had surgery  . Sensorineural hearing loss, bilateral 2016   Strict hearing loss precautions    PAST SURGICAL HISTORY   Past Surgical History:  Procedure Laterality Date  . Cataract surgery  2018   Dr. Calvert Cantor  . TONSILLECTOMY     age 60  . TRANSTHORACIC ECHOCARDIOGRAM  03/2017   [facility]): Mild LVH with normal LV contraction (LVEF 50%). Severely calcified aortic valve with mild regurgitation and moderate-severe stenosis (AoV velocity 4.4 m/s, AVA 0.6 cm^2). Mitral calcification with mild MR. Mild pulmonary hypertension. Normal RV size and function.    MEDICATIONS/ALLERGIES   No outpatient medications have been marked as taking for the 02/07/20 encounter (Office Visit) with Leonie Man, MD.    No Known Allergies  SOCIAL HISTORY/FAMILY HISTORY   Social  History   Tobacco Use  . Smoking status: Never Smoker  . Smokeless tobacco: Never Used  Vaping Use  . Vaping Use: Never used  Substance Use Topics  . Alcohol use: Not Currently    Alcohol/week: 6.0 standard drinks    Types: 6 Shots of liquor per week    Comment: vodka when he stopped drinking roughly 3 months ago _0  none none none  . 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      Exercise-minimal   Quit drinking alcohol in March 2021   Family History  Problem Relation Age of Onset  . Diabetes Other        GM  . CAD Brother 102       PCI at age 54;   . Stroke Mother 57       Had second stroke at age 60, died age 46  . Cancer Father 7       Reportedly sinus cancer  . Colon cancer Neg Hx   . Prostate cancer Neg Hx     OBJCTIVE -PE, EKG, labs   Wt Readings from Last 3 Encounters:  02/07/20 147 lb  3.2 oz (66.8 kg)  03/24/17 153 lb (69.4 kg)  02/12/16 144 lb 4 oz (65.4 kg)    Physical Exam: BP (!) 157/99   Pulse (!) 106   Ht _0  (1.778 m)   Wt 147 lb 3.2 oz (66.8 kg)   BMI 21.12 kg/m  Physical Exam Vitals reviewed.  Constitutional:      General: He is not in acute distress.    Appearance: Normal appearance. He is normal weight. He is ill-appearing (Nontoxic, but chronically ill-appearing.  Somewhat thin and frail.).  HENT:     Head: Normocephalic and atraumatic.     Comments: Appropriately wearing mask covering Neck:     Vascular: Decreased carotid pulses (Difficult to palpate due to tachycardia, but appears to have parvus et tardus). No carotid bruit (Radiated aortic murmur), hepatojugular reflux or JVD.  Cardiovascular:     Rate and Rhythm: Normal rate and regular rhythm.  No extrasystoles are present.    Chest Wall: PMI is displaced (Slightly lateral displacement).     Pulses: Decreased pulses (Diminished because of pedal edema, palpable).      Heart sounds: S1 normal. Heart sounds are distant. Murmur heard. High-pitched harsh crescendo-decrescendo mid to late systolic murmur is present at the upper right sternal border radiating to the neck. Difficult S2.   No friction rub. Gallop present. S4 sounds: Cannot exclude soft S4.      Comments: Rate on EKG is faster than on exam Abdominal:     General: Bowel sounds are normal. There is no distension.     Palpations: Abdomen is soft. There is no mass (Cannot exclude hepatomegaly).     Tenderness: There is abdominal tenderness. There is no right CVA tenderness, left CVA tenderness, guarding or rebound.     Comments: Slightly protuberant and firm abdomen with mild succussion  Musculoskeletal:        General: Swelling (2-3+ bilateral lower extremity edema, wearing support stockings) present.     Cervical back: Normal range of motion and neck supple.  Neurological:     General: No focal deficit present.     Mental Status: He is alert and oriented to person, place, and time.     Cranial Nerves: No cranial nerve deficit.  Psychiatric:        Mood and Affect: Mood normal.        Behavior: Behavior normal.        Thought Content: Thought content normal.        Judgment: Judgment normal.     Adult ECG Report  Rate: 10 the inability to be totally placed right 6;  Rhythm: sinus tachycardia 1  AVB; IVCD, left axis deviation (-61 ), ST-T wave abnormality with T wave inversions in lateral leads, LVH repolarization versus ischemia.  Narrative Interpretation: As compared to previous EKG, rate faster and repolarization changes more pronounced  Recent Labs: 02/03/2020  Na+ 138, K+ 4.8, Cl- 108, HCO3- 22, BUN 29, Cr 1.2, Glu 99, Ca2+ 9.8; AST 54, ALT 207, AlkP 86; Tprot 6.2, Alb 3.8  CBC: W 10.72, H/H 14.1/44, Plt 135; TSH 2.6  01/14/2020  CBC: W 6.2, H/H 12.8/35.8, Plt 127; A1c 6.0, PSA 1.3  08/06/2019  Na+ 139, K+ 4.0, CO2-24,, BUN 13, Cr 1.28, Glu 106,AST 15, ALT 11, AlkP 60  CBC: W 4.1,  H/H 13.5/40.8, Plt 229  TC 223, TG 104, HDL 61, LDL 144; A1c 5.8.  CXR 01/13/2020: Small loculated pleural effusions bilaterally with bibasal atelectasis.  Otherwise lungs clear.  Upper limit of normal heart size.  No adenopathy.  Aortic atherosclerosis.  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  Never had his nuclear stress test done.  He now has symptoms of progressive dyspnea, edema and extreme exercise intolerance.  I think a dry cough is indication of potential cardiac asthma.  Plan: Recheck 2D echocardiogram to determine extent of aortic stenosis.  I did talk to him about the next course of action pending the echo results.  He did not seem all that he said about the idea of invasive procedures, but understands that we need to discuss this pending echo results.  I discussed open AVR versus TAVR, and that he likely would be a Taber patient.  Discussed preop evaluation with [facility] / & ? TEE.  -> Echo ordered for tomorrow.  We will see him back in 2 days.      Relevant Medications   furosemide (LASIX) 20 MG tablet   carvedilol (COREG) 3.125 MG tablet   carvedilol (COREG) 3.125 MG tablet   Other Relevant Orders   EKG 12-Lead (Completed)   ECHOCARDIOGRAM COMPLETE (Completed)   Hyperlipidemia with target LDL less than 100 (Chronic)    As of February, lipids were very poorly controlled with an LDL of 144.  Unfortunately his most recent LFTs have pretty significant elevation of ALT with less significant AST. --> Suspect that some of the elevated LFTs are related to hepatic congestion from CHF.  Hopefully with diuresis this will improve.  Reassess lipids in the future, notably if there is any evidence of CAD.      Relevant Medications   furosemide (LASIX) 20 MG tablet    carvedilol (COREG) 3.125 MG tablet   carvedilol (COREG) 3.125 MG tablet   Acute congestive heart failure ([place])    At present I would suspect that he has a least diastolic heart failure, but do not know his EF.  He does not truly have orthopnea, he does have cough with lying down.  He has significant edema and exertional dyspnea.  Plan:   Lasix 40 mg daily for 3 days then 20 mg daily.  Carvedilol 3.125 mg twice daily      Relevant Medications   furosemide (LASIX) 20 MG tablet   carvedilol (COREG) 3.125 MG tablet   carvedilol (COREG) 3.125 MG tablet   Abnormal EKG    Grossly abnormal EKG.  Clear this could go along with LVH, but cannot exclude ischemia. With upcoming echo pending, I suspect that we may be considering ischemic evaluation invasively and therefore would not order stress test or coronary CTA.      Elevated blood pressure reading    He does not have a clear-cut diagnosis of hypertension, but his blood pressure today is clearly elevated.  He is also quite tachycardic.  In the setting of possible severe aortic stenosis, we need to reduce his heart rate and afterload.  Plan: Initiate 3.125 mg twice daily Coreg and titrate as tolerated.      Relevant Orders   EKG 12-Lead (Completed)   ECHOCARDIOGRAM COMPLETE (Completed)   Bilateral lower extremity edema    Based on increased abdominal girth and significant aortic stenosis murmur with delayed cardiac upstroke etc., I suspect this could be related to heart failure from progression of aortic  stenosis. Blood pressure is elevated.  Plan:   Start furosemide 40 mg daily for for 3 days and then reduce to 20 mg.  Start low-dose carvedilol 3.125 mg twice daily and titrate       Relevant Orders   ECHOCARDIOGRAM COMPLETE (Completed)   DOE (dyspnea on exertion)    Until proven otherwise aortic stenosis is the most likely culprit.  We are checking 2D echo to reassess, but suspected regardless we may need to consider ischemic  evaluation in the future.  If echo does not show progressive aortic stenosis, would consider coronary CTA      Relevant Orders   EKG 12-Lead (Completed)   ECHOCARDIOGRAM COMPLETE (Completed)       COVID-19 Education: The signs and symptoms of COVID-19 were discussed with the patient and how to seek care for testing (follow up with PCP or arrange E-visit).   The importance of social distancing and COVID-19 vaccination was discussed today.  I spent a total of 28 minutes with the patient. >  50% of the time was spent in direct patient consultation.  Additional time spent with chart review  / charting (studies, outside notes, etc): 20 Total Time: 48 min   Current medicines are reviewed at length with the patient today.  (+/- concerns) N/A  Notice: This dictation was prepared with Dragon dictation along with smaller phrase technology. Any transcriptional errors that result from this process are unintentional and may not be corrected upon review.  Patient Instructions / Medication Changes & Studies & Tests Ordered   Patient Instructions  Medication Instructions:   start  Furosemide 40 mg ( 2 tablets ) for 3 days  Then decrease to 20 mg daily   Start carvedilol 3.125 mg  1 tablet  Twice daily     *If you need a refill on your cardiac medications before your next appointment, please call your pharmacy*   Lab Work: Not needed If you have labs (blood work) drawn today and your tests are completely normal, you will receive your results only by: Marland Kitchen MyChart Message (if you have MyChart) OR . A paper copy in the mail If you have any lab test that is abnormal or we need to change your treatment, we will call you to review the results.   Testing/Procedures:Your physician has requested that you have an echocardiogram. Echocardiography is a painless test that uses sound waves to create images of your heart. It provides your doctor with information about the size and shape of your heart and  how well your heart's chambers and valves are working. This procedure takes approximately one hour. There are no restrictions for this procedure.     Follow-Up: At [facility], you and your health needs are our priority.  As part of our continuing mission to provide you with exceptional heart care, we have created designated Provider Care Teams.  These Care Teams include your primary Cardiologist (physician) and Advanced Practice Providers (APPs -  Physician Assistants and Nurse Practitioners) who all work together to provide you with the care you need, when you need it.  We recommend signing up for the patient portal called "MyChart".  Sign up information is provided on this After Visit Summary.  MyChart is used to connect with patients for Virtual Visits (Telemedicine).  Patients are able to view lab/test results, encounter notes, upcoming appointments, etc.  Non-urgent messages can be sent to your provider as well.   To learn more about what you can do with MyChart, go  to [email or web address].    Your next appointment:   1 week(s)  The format for your next appointment:   In Person  Provider:   Glenetta Hew, MD   Other Instructions    Studies Ordered:   Orders Placed This Encounter  Procedures  . EKG 12-Lead  . ECHOCARDIOGRAM COMPLETE   ADDENDUM:   Echo 02/08/2020: EF 25-30% with severely reduced function global).  Moderately elevated PA pressures (~53 mmHg).  Severe AS with mild to moderate AI (AVA by VTI ~0.49 cm, mean gradient 41 mmHg. V max 3.83ms; AI py PHT 465 msec)  Mild MR.  Mildly dilated left atrium.  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  Small circumferential pericardial effusion.  **Will review with patient in clinic on 02/09/2020   DGlenetta Hew M.D., M.S. Interventional Cardiologist   Pager # [phone] # [phone] [place] [address]  Thank you for choosing  [facility]!

## 2020-02-08 ENCOUNTER — Ambulatory Visit (HOSPITAL_COMMUNITY): Payer: Medicare Other | Attending: Cardiology

## 2020-02-08 ENCOUNTER — Encounter (HOSPITAL_COMMUNITY): Payer: Self-pay

## 2020-02-08 ENCOUNTER — Encounter: Payer: Self-pay | Admitting: Cardiology

## 2020-02-08 DIAGNOSIS — R600 Localized edema: Secondary | ICD-10-CM | POA: Insufficient documentation

## 2020-02-08 DIAGNOSIS — R030 Elevated blood-pressure reading, without diagnosis of hypertension: Secondary | ICD-10-CM | POA: Insufficient documentation

## 2020-02-08 DIAGNOSIS — I350 Nonrheumatic aortic (valve) stenosis: Secondary | ICD-10-CM | POA: Insufficient documentation

## 2020-02-08 DIAGNOSIS — R0609 Other forms of dyspnea: Secondary | ICD-10-CM

## 2020-02-08 DIAGNOSIS — R0600 Dyspnea, unspecified: Secondary | ICD-10-CM | POA: Diagnosis not present

## 2020-02-08 DIAGNOSIS — I5041 Acute combined systolic (congestive) and diastolic (congestive) heart failure: Secondary | ICD-10-CM | POA: Insufficient documentation

## 2020-02-08 LAB — ECHOCARDIOGRAM COMPLETE
AR max vel: 0.5 cm2
AV Area VTI: 0.49 cm2
AV Area mean vel: 0.46 cm2
AV Mean grad: 41 mmHg
AV Peak grad: 62.6 mmHg
Ao pk vel: 3.96 m/s
Area-P 1/2: 6.32 cm2
MV M vel: 5.13 m/s
MV Peak grad: 105.3 mmHg
P 1/2 time: 465 msec
Radius: 0.6 cm
S' Lateral: 4.3 cm

## 2020-02-08 NOTE — Assessment & Plan Note (Signed)
3 years ago, he had an echocardiogram showing moderate AS, and has not been seen in follow-up since.  Never had his nuclear stress test done.  He now has symptoms of progressive dyspnea, edema and extreme exercise intolerance.  I think a dry cough is indication of potential cardiac asthma.  Plan: Recheck 2D echocardiogram to determine extent of aortic stenosis.  I did talk to him about the next course of action pending the echo results.  He did not seem all that he said about the idea of invasive procedures, but understands that we need to discuss this pending echo results.  I discussed open AVR versus TAVR, and that he likely would be a Taber patient.  Discussed preop evaluation with [facility] / & ? [facility]EE.  -> Echo ordered for tomorrow.  We will see him back in 2 days.

## 2020-02-08 NOTE — Assessment & Plan Note (Signed)
Until proven otherwise aortic stenosis is the most likely culprit.  We are checking 2D echo to reassess, but suspected regardless we may need to consider ischemic evaluation in the future.  If echo does not show progressive aortic stenosis, would consider coronary CTA

## 2020-02-08 NOTE — Assessment & Plan Note (Signed)
At present I would suspect that he has a least diastolic heart failure, but do not know his EF.  He does not truly have orthopnea, he does have cough with lying down.  He has significant edema and exertional dyspnea.  Plan:   Lasix 40 mg daily for 3 days then 20 mg daily.  Carvedilol 3.125 mg twice daily

## 2020-02-08 NOTE — Assessment & Plan Note (Signed)
Based on increased abdominal girth and significant aortic stenosis murmur with delayed cardiac upstroke etc., I suspect this could be related to heart failure from progression of aortic stenosis. Blood pressure is elevated.  Plan:   Start furosemide 40 mg daily for for 3 days and then reduce to 20 mg.  Start low-dose carvedilol 3.125 mg twice daily and titrate

## 2020-02-08 NOTE — Progress Notes (Signed)
Mr. Yohe presented for echocardiogram today. Echo showed EF severely depressed ~20-25% with severe AS. DOD (Dr. Marlou Porch) was notified of the findings. Dr. Marlou Porch advised the patient is stable to be discharged and forward the note to Dr. Ellyn Hack with the initial findings.

## 2020-02-08 NOTE — Assessment & Plan Note (Signed)
Grossly abnormal EKG.  Clear this could go along with LVH, but cannot exclude ischemia. With upcoming echo pending, I suspect that we may be considering ischemic evaluation invasively and therefore would not order stress test or coronary CTA.

## 2020-02-08 NOTE — Assessment & Plan Note (Signed)
As of February, lipids were very poorly controlled with an LDL of 144.  Unfortunately his most recent LFTs have pretty significant elevation of ALT with less significant AST. --> Suspect that some of the elevated LFTs are related to hepatic congestion from CHF.  Hopefully with diuresis this will improve.  Reassess lipids in the future, notably if there is any evidence of CAD.

## 2020-02-08 NOTE — Assessment & Plan Note (Signed)
He does not have a clear-cut diagnosis of hypertension, but his blood pressure today is clearly elevated.  He is also quite tachycardic.  In the setting of possible severe aortic stenosis, we need to reduce his heart rate and afterload.  Plan: Initiate 3.125 mg twice daily Coreg and titrate as tolerated.

## 2020-02-09 ENCOUNTER — Ambulatory Visit (INDEPENDENT_AMBULATORY_CARE_PROVIDER_SITE_OTHER): Payer: Medicare Other | Admitting: Cardiology

## 2020-02-09 ENCOUNTER — Encounter: Payer: Self-pay | Admitting: Cardiology

## 2020-02-09 ENCOUNTER — Other Ambulatory Visit: Payer: Self-pay

## 2020-02-09 ENCOUNTER — Ambulatory Visit: Payer: Medicare Other | Admitting: Cardiology

## 2020-02-09 VITALS — BP 130/82 | HR 73 | Temp 97.2°F | Ht 70.0 in | Wt 138.2 lb

## 2020-02-09 DIAGNOSIS — I420 Dilated cardiomyopathy: Secondary | ICD-10-CM

## 2020-02-09 DIAGNOSIS — R0600 Dyspnea, unspecified: Secondary | ICD-10-CM | POA: Diagnosis not present

## 2020-02-09 DIAGNOSIS — I5041 Acute combined systolic (congestive) and diastolic (congestive) heart failure: Secondary | ICD-10-CM

## 2020-02-09 DIAGNOSIS — E785 Hyperlipidemia, unspecified: Secondary | ICD-10-CM | POA: Diagnosis not present

## 2020-02-09 DIAGNOSIS — I509 Heart failure, unspecified: Secondary | ICD-10-CM | POA: Diagnosis not present

## 2020-02-09 DIAGNOSIS — I350 Nonrheumatic aortic (valve) stenosis: Secondary | ICD-10-CM | POA: Diagnosis not present

## 2020-02-09 DIAGNOSIS — R9431 Abnormal electrocardiogram [ECG] [EKG]: Secondary | ICD-10-CM

## 2020-02-09 MED ORDER — LOSARTAN POTASSIUM 25 MG PO TABS
25.0000 mg | ORAL_TABLET | Freq: Every day | ORAL | 3 refills | Status: DC
Start: 2020-02-09 — End: 2020-05-15

## 2020-02-09 NOTE — Progress Notes (Signed)
Primary Care Provider: Michael Boston, MD Cardiologist: Glenetta Hew, MD Electrophysiologist: None  Clinic Note: Chief Complaint  Patient presents with  . Follow-up    Feels much better; Echo results  . Aortic Stenosis    Now severe   HPI:    Cesar Bullock is a 79 y.o. male who is being seen today for the close follow-up of now SEVERE (symptomatic) AORTIC STENOSIS, SHORTNESS OF BREATH, LEG SWELLING --seen initially at the request of Michael Boston, MD. following recent hospitalization for dry cough and dyspnea.  Cesar Bullock seen by Dr. Saunders Revel back in September 2018 for aortic stenosis and abnormal BNP.  Echo showed moderate-severe aortic stenosis.  They discussed ischemic evaluation with stress test versus cath and plan was 1 month follow-up, but he was lost to follow-up.  He noted that he was feeling well.  Not exercising routinely, but was very active as a Engineer, technical sales.  Recent Hospitalizations:  01/13/2020-[facility]: 1-1.4-monthhistory of persistent dry cough and dyspnea.  Was prescribed azithromycin followed by prednisone no fever or sinus congestion.  COVID-19 negative.  CXR negative for pneumonia--was sent to ER because of pleural effusions  I just saw him on 02/07/2020 for return consultation with significant worsening edema, cough, and profound dyspnea.  He had noted some edema over the last 2 to 3 months but much worse over the past 4 weeks.  He also noted increasing abdominal girth but not necessarily weight change.  Significant exacerbation of his exertional dyspnea, but no real PND orthopnea-other than worsening cough with lying flat..  I was quite concerned about the patient who had a very harsh aortic systolic murmur with decreased carotid impulse, significant 3+ edema and a look of being quite ill. -->  We were able to get an echocardiogram done yesterday and am seeing him back today based on how severe the findings of the echo are.  We started low-dose carvedilol and  Lasix with plans to take 40 mg for 3 days and then reduce to 20 mg daily.  Reviewed  CV studies:    The following studies were reviewed today: (if available, images/films reviewed: From Epic Chart or Care Everywhere)  Echo 02/08/2020: EF 25-30% with severely reduced function global).  Moderately elevated PA pressures (~53 mmHg).  Severe AS with mild to moderate AI (AVA by VTI ~0.49 cm, mean gradient 41 mmHg. V max 3.971m; AI py PHT 465 msec)  Mild MR.  Mildly dilated left atrium.  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  Small circumferential pericardial effusion.  Immunization History  Administered Date(s) Administered  . PFIZER SARS-COV-2 Vaccination 08/27/2019, 09/21/2019   Interval History:   Cesar Bullock presents here feeling a whole lot better.  He lost about 6 to 9 pounds with diuresis.  He has been urinating quite frequently having to go twice during the clinic visit.  Breathing has notably improved and he is not really have any orthopnea anymore.  He indicates that he woke up the next day after a visit having taken Lasix in the evening already feeling better.  Notably less dyspneic. However he still does have fatigue and exertional dyspnea.  No chest pain or pressure.  No palpitations.  He is still unsure as to how he wants to proceed.  Very very very unsure of whether he would want to have any procedures done during my discussion with him.  We reviewed his echocardiogram together and explained to him the  severity of his ejection fraction and the valve stenosis.  The fact that he has a mean gradient of 41 mmHg and an EF of 25 to 30% knee is that it is probably more severe than that.  CV Review of Symptoms (Summary) Cardiovascular ROS: positive for - dyspnea on exertion, edema, palpitations, rapid heart rate, shortness of breath and Still has cough with lying down/Orthopnea.  Overall improved edema and exertional dyspnea.  Heart rate is also  improved negative for - chest pain, irregular heartbeat, palpitations, paroxysmal nocturnal dyspnea or Lightheadedness but no syncope or near syncope.  No TIA or amaurosis fugax.  The patient DOES NOT have symptoms concerning for COVID-19 infection (fever, chills, cough, or new shortness of breath).  The patient is practicing social distancing & Masking.   08/12/2019: SARS-CoV-2 NAA detected (nucleic acid implication test) ([facility])  08/18/2019: Covid + ([facility])  Follow-up tests on 08/25/2019 ([facility])-not detected  01/11/2020, 01/24/2020 SARS-CoV2 RNA-negative    REVIEWED OF SYSTEMS   Review of Systems  Constitutional: Positive for malaise/fatigue and weight loss (6 pounds down in counting.). Negative for chills and fever.  HENT: Negative for congestion and nosebleeds.   Respiratory: Positive for shortness of breath. Negative for cough and wheezing.   Cardiovascular: Positive for leg swelling.  Gastrointestinal: Negative for abdominal pain, blood in stool and melena.  Genitourinary: Negative for frequency and hematuria.  Musculoskeletal: Positive for joint pain.  Neurological: Positive for dizziness (Lightheadedness) and weakness (General). Negative for focal weakness, seizures and headaches.  Endo/Heme/Allergies: Negative for environmental allergies.  Psychiatric/Behavioral: Negative for depression and memory loss. The patient is not nervous/anxious and does not have insomnia.        Seems very fearful of procedures.    I have reviewed and (if needed) personally updated the patient's problem list, medications, allergies, past medical and surgical history, social and family history.   PAST MEDICAL HISTORY   Past Medical History:  Diagnosis Date  . 2019 novel coronavirus disease (COVID-19) 08/12/2019   -Follow-up test on August 25, 2019 not detected.  Again not detected  both July 13 and 26, 2021  . Aortic stenosis 03/20/2017   Mild LVH.  EF~50%.  Severely calcified aortic  valve-moderate AS (AoV velocity 4.4 m/s, AVA~0.6 cm; with mild AI.  MAC with mild MR.  Mild pulmonary hypertension but normal RV size and function.  . Disequilibrium   . Elevated blood pressure reading without diagnosis of hypertension 05/03/2008   Qualifier: Diagnosis of  By: Larose Kells MD, [place] Hyperglycemia 11/09/2012  . HYPERLIPIDEMIA, MILD 04/02/2010   Qualifier: Diagnosis of  By: Larose Kells MD, Alda Berthold Nuclear cataract 2018   Had surgery  . Sensorineural hearing loss, bilateral 2016   Strict hearing loss precautions    PAST SURGICAL HISTORY   Past Surgical History:  Procedure Laterality Date  . Cataract surgery  2018   Dr. Calvert Cantor  . TONSILLECTOMY     age 28  . TRANSTHORACIC ECHOCARDIOGRAM  03/2017   [facility]): Mild LVH with normal LV contraction (LVEF 50%). Severely calcified aortic valve with mild regurgitation and moderate-severe stenosis (AoV velocity 4.4 m/s, AVA 0.6 cm^2). Mitral calcification with mild MR. Mild pulmonary hypertension. Normal RV size and function.  . TRANSTHORACIC ECHOCARDIOGRAM  07/19/2015    Normal LV size with mild LVH. LVEF 55-60% with grade 2 diastolic dysfunction. Moderate AS and mild AI. MAC with mild MR. Mild LA enlargement. Normal RV size and function.  Marland Kitchen  TRANSTHORACIC ECHOCARDIOGRAM  02/11/2020   EF 25-30% with severely reduced function global).  Moderately elevated PA pressures (~53 mmHg).  Severe AS with mild to moderate AI (AVA by VTI ~0.49 cm, mean gradient 41 mmHg. V max 3.36ms; AI py PHT 465 msec)    MEDICATIONS/ALLERGIES   No outpatient medications have been marked as taking for the 02/09/20 encounter (Office Visit) with HLeonie Man MD.    No Known Allergies  SOCIAL HISTORY/FAMILY HISTORY   Social History   Tobacco Use  . Smoking status: Never Smoker  . Smokeless tobacco: Never Used  Vaping Use  . Vaping Use: Never used  Substance Use Topics  . Alcohol use: Not Currently    Alcohol/week: 6.0 standard  drinks    Types: 6 Shots of liquor per week    Comment: vodka when he stopped drinking roughly 3 months ago _0  none none none  . Drug use: No   Social History   Social History Narrative   Lives by himself   6 g-kids      Former [place]      Exercise-minimal   Quit drinking alcohol in March 2021   Family History  Problem Relation Age of Onset  . Diabetes Other        GM  . CAD Brother 553      PCI at age 77   . Stroke Mother 762      Had second stroke at age 77 died age 77 . Cancer Father 39      Reportedly sinus cancer  . Colon cancer Neg Hx   . Prostate cancer Neg Hx     OBJCTIVE -PE, EKG, labs   Wt Readings from Last 3 Encounters:  02/09/20 138 lb 3.2 oz (62.7 kg)  02/07/20 147 lb 3.2 oz (66.8 kg)  03/24/17 153 lb (69.4 kg)    Physical Exam: BP 130/82   Pulse 73   Temp (!) 97.2 F (36.2 C)   Ht _1  (1.778 m)   Wt 138 lb 3.2 oz (62.7 kg)   SpO2 98%   BMI 19.83 kg/m  Physical Exam Vitals reviewed.  Constitutional:      General: He is not in acute distress.    Appearance: Normal appearance. He is normal weight. He is ill-appearing (Thin, frail but nontoxic.  Seems more upbeat than last visit.).  HENT:     Head: Normocephalic and atraumatic.     Comments: Appropriately wearing mask covering Neck:     Vascular: Decreased carotid pulses (Parvus et tardus). Hepatojugular reflux and JVD present. No carotid bruit (Radiated aortic murmur).  Cardiovascular:     Rate and Rhythm: Normal rate and regular rhythm.  No extrasystoles are present.    Chest Wall: PMI is displaced (Slightly lateral displacement).     Pulses: Decreased pulses (Diminished because of pedal edema, palpable).     Heart sounds: S1 normal. Heart sounds are distant (Normal S1 with nondistended S2.). Murmur heard.  Harsh crescendo-decrescendo mid to late systolic murmur is present with a grade of 4/6 at the upper right sternal border radiating to the  neck.  No friction rub. Gallop present. S4 sounds: Cannot exclude soft S4.      Comments: Rate on EKG is faster than on exam Abdominal:     General: Bowel sounds are normal. There is no distension.     Palpations: Abdomen is soft. There is no mass (Cannot exclude hepatomegaly).  Tenderness: There is abdominal tenderness. There is no right CVA tenderness, left CVA tenderness, guarding or rebound.     Comments: Slightly protuberant and firm abdomen with mild succussion  Musculoskeletal:        General: Swelling (Still wearing support stockings, but edema is notably improved no longer 3+.) present.     Cervical back: Normal range of motion and neck supple.  Neurological:     General: No focal deficit present.     Mental Status: He is alert and oriented to person, place, and time.     Cranial Nerves: No cranial nerve deficit.  Psychiatric:        Mood and Affect: Mood normal.        Behavior: Behavior normal.        Thought Content: Thought content normal.        Judgment: Judgment normal.     Adult ECG Report n/a  Lab Results  Component Value Date   CREATININE 1.10 02/11/2020   BUN 20 02/11/2020   NA 143 02/11/2020   K 4.3 02/11/2020   CL 100 02/11/2020   CO2 29 02/11/2020   BNP: 2618  Recent Labs: 01/27/2020  Na+ 138, K+ 4.8, Cl- 108, HCO3- 22, BUN 29, Cr 1.2, Glu 99, Ca2+ 9.8; AST 54, ALT 207, AlkP 86; Tprot 6.2, Alb 3.8  CBC: W 10.72, H/H 14.1/44, Plt 135; TSH 2.6  01/14/2020  CBC: W 6.2, H/H 12.8/35.8, Plt 127; A1c 6.0, PSA 1.3  08/06/2019: TC 223, TG 104, HDL 61, LDL 144; A1c 5.8.  CXR 01/13/2020: Small loculated pleural effusions bilaterally with bibasal atelectasis.  Otherwise lungs clear.  Upper limit of normal heart size.  No adenopathy.  Aortic atherosclerosis.   ASSESSMENT/PLAN    Problem List Items Addressed This Visit    Symptomatic severe aortic stenosis with normal ejection fraction (Chronic)    Unfortunately, he now has progressed to severe aortic  stenosis and has developed severe cardiomyopathy as well.  The mean gradient as it stands likely underestimates the valve stenosis based on low EF.  Cesar Bullock really was having hard time come to grips with the extent of how bad his valve is.  He asked multiple times if we could just adjust the medication to treat him medically and see how things go.  I try to explain to him that the likelihood of him getting a meaningful recovery is not there.  The valve will continue to get worse as well his EF.  We would always continue to treat his symptoms, but that would not change his long-term outcome.  His longtime significant other who is with him today did show some frustration with him and his inability to comprehend.  He said it was not that he was not able to comprehend, he just was not sure that he had in him to go through major procedures. --> However left of the end of the visit was that we would continue to adjust medications, he would take 40 mg Lasix for least another 3 days before reducing dose and then increase to 40 mg as needed based on sliding scale.  We added losartan for afterload reduction (would like to potentially titrate to [place] based on how he responds--based on his frailty, I did not want to start off with Entresto first)  We would plan to follow-up in 2 to 76month with either myself or an APP.  At any time if he chose to proceed with AVR/TAVR work-up with cardiac catheterization and TEE, we  could have a scheduled (as long as it was within 1 month of this visit, he would not to be seen back).       Relevant Medications   losartan (COZAAR) 25 MG tablet   Other Relevant Orders   Basic metabolic panel (Completed)   Brain natriuretic peptide (Completed)   Hyperlipidemia with target LDL less than 100 (Chronic)    LDL is 144.  I suspect that we will need to start treating his lipids, but at present would not want to start to any medications that may potentially cause symptoms of  fatigue.  No doubt elevated LFTs are probably related to hepatic congestion from CHF. During the next few stages of medication titration will have labs checked and reevaluate liver function.  How aggressive we need to be will depend on his decision about proceeding with TAVR evaluation.  I would not see any need for being aggressive treating his lipids if he is not going to proceed with TAVR.      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  He has had a pretty impressive response to furosemide and carvedilol, but as we discussed, we will need aggressive medical management simply for symptoms but would not get better unless he has a valve replacement.  Plan:  Continue carvedilol 3.125 mg twice daily -> will allow [place] to initiate ARB.  We will start her losartan 25 mg daily with plans to convert to [facility] if he is able to tolerate losartan.  Continue Lasix 40 mill him daily for the next 3 days and then reduce to 20 mg daily with as needed dosing of additional 20 mg for weight gain greater than 3 pounds.  Will need close follow-up and continue to discuss whether or not he will go forward with TAVR evaluation.  With starting an ARB and increasing Lasix, we will check a BNP and BMP today He will need relatively close follow-up for titration of medications.       Relevant Medications   losartan (COZAAR) 25 MG tablet   Dilated cardiomyopathy (HCC)    Somewhat surprising how low his EF truly is.  Were not sure if this is all valvular or could be potentially ischemic.  Would definitely warrant RIGHT & LEFT HEART CATHETERIZATION as part of cardiomyopathy evaluation but also TAVR evaluation.  We started a beta-blocker now adding ARB that  would hopefully be converted to [facility] if blood pressure tolerates. Would also need to consider spironolactone.   He will need to be seen in close follow-up-and continue to discuss concerns about the need for TAVR evaluation.      Relevant Medications   losartan (COZAAR) 25 MG tablet   Abnormal EKG - Primary   Relevant Orders   Basic metabolic panel (Completed)   Brain natriuretic peptide (Completed)      COVID-19 Education: The signs and symptoms of COVID-19 were discussed with the patient and how to seek care for testing (follow up with PCP or arrange E-visit).   The importance of social distancing and COVID-19 vaccination was discussed today.  I spent a total of 34 minutes with the patient. >  50% of the time was spent in direct patient consultation.  Additional time spent with chart review  /  charting (studies, outside notes, etc): 12 Total Time: 67mn   Current medicines are reviewed at length with the patient today.  (+/- concerns) no concerns of medications, but significant concerns about what he wants to going forward.  Notice: This dictation was prepared with Dragon dictation along with smaller phrase technology. Any transcriptional errors that result from this process are unintentional and may not be corrected upon review.  Patient Instructions / Medication Changes & Studies & Tests Ordered   Patient Instructions  Medication Instructions:   FOR THE NEXT 2 DAYS  TAKE  FUROSEMIDE 20 MG TWICE A DAY  ( MORNING AND LUNCH)  THEN DECREASE TO 20 MG DAILY.  LOSARTAN 25 MG ONE TABLET DAILY    WEIGH YOURSELF DAILY IF YOU GAIN 3 OR MORE POUNDS  OVERNIGHT  TAKE AN ADDITIONAL FUROSEMIDE 20 MG *If you need a refill on your cardiac medications before your next appointment, please call your pharmacy*   Lab Work:  BNP BMP today  If you have labs (blood work) drawn today and your tests are completely normal, you will receive your results only by: .Marland KitchenMyChart Message (if you have  MyChart) OR . A paper copy in the mail If you have any lab test that is abnormal or we need to change your treatment, we will call you to review the results.   Testing/Procedures: Not needed   Follow-Up: [facility] and [facility] are our [facility].  As part of our continuing mission to provide you with exceptional heart care, we have created designated Provider Care Teams.  These Care Teams include your primary Cardiologist (physician) and Advanced Practice Providers (APPs -  Physician Assistants and Nurse Practitioners) who all work together to provide you with the care you need, when you need it.  We recommend signing up for the patient portal called "MyChart".  Sign up information is provided on this After Visit Summary.  MyChart is used to connect with patients for Virtual Visits (Telemedicine).  Patients are able to view lab/test results, encounter notes, upcoming appointments, etc.  Non-urgent messages can be sent to your provider as well.   To learn more about what you can do with MyChart, go to [email or web address]    Your next appointment:   2 week(s)  The format for your next appointment:    either  office or virtual   Provider:   DGlenetta Hew MD   Other Instructions    CONTACT OFFICE IF YOU DECIDE TO PROCEED WITH FURTHER TESTING  TRASNESOPHAGEAL  ECHOCARDIOGRAM AND RIGHT AND LEFT HEART CATHETERIZATION    Studies Ordered:   Orders Placed This Encounter  Procedures  . Basic metabolic panel  . Brain natriuretic peptide   ADDENDUM:  The following day, we received a call from Cesar Bullock's girlfriend who indicated that he now has agreed to go forward with TAVR evaluation.  They had a long talk when he went home, and he voiced his fears and concerns, but was able to understand that he has a chance for a better quality and prolong life if he proceeds with TAVR, but would not likely have a meaningful recovery otherwise.  Based on this, he will be  scheduled for TEE followed by [place] February 16, 2020.   Performing MD:  DGlenetta Hew M.D., M.S.  Procedure:  RIGHT & LEFT HEART CATHETERIZATION WITH CORONARY ANGIOGRAPHY; TRANSTHORACIC ECHOCARDIOGRAM  The procedure with Risks/Benefits/Alternatives and Indications was reviewed with the patient & long-term girlfirend.  All questions were answered.    CATH: Risks / Complications include, but not limited to: Death, MI, CVA/TIA, VF/VT (with defibrillation), Bradycardia (need for temporary pacer placement), contrast induced nephropathy, bleeding / bruising / hematoma / pseudoaneurysm, vascular or coronary injury (with possible emergent CT or Vascular Surgery), adverse medication reactions, infection.  Additional risks involving the use of radiation with the possibility of radiation burns and cancer were explained in detail.  TEE: Risks/complications include but not limited to: Death/CVA/TIA, esophageal tear, tracheal intubation or injury.  The patient and friend voice understanding and agree to proceed.      Glenetta Hew, M.D., M.S. Interventional Cardiologist   Pager # [phone] Phone # [phone] [address] [place], [place] [address]   Thank you for choosing Heart[facility] at [facility]!!

## 2020-02-09 NOTE — H&P (View-Only) (Signed)
Primary Care Provider: Michael Boston, MD Cardiologist: Glenetta Hew, MD Electrophysiologist: None  Clinic Note: Chief Complaint  Patient presents with  . Follow-up    Feels much better; Echo results  . Aortic Stenosis    Now severe   HPI:    Cesar Bullock is a 79 y.o. male who is being seen today for the close follow-up of now SEVERE (symptomatic) AORTIC STENOSIS, SHORTNESS OF BREATH, LEG SWELLING --seen initially at the request of Michael Boston, MD. following recent hospitalization for dry cough and dyspnea.  Cesar Bullock seen by Dr. Saunders Revel back in September 2018 for aortic stenosis and abnormal BNP.  Echo showed moderate-severe aortic stenosis.  They discussed ischemic evaluation with stress test versus cath and plan was 1 month follow-up, but he was lost to follow-up.  He noted that he was feeling well.  Not exercising routinely, but was very active as a Engineer, technical sales.  Recent Hospitalizations:  01/13/2020-[facility]: 1-1.27-monthhistory of persistent dry cough and dyspnea.  Was prescribed azithromycin followed by prednisone no fever or sinus congestion.  COVID-19 negative.  CXR negative for pneumonia--was sent to ER because of pleural effusions  I just saw him on 02/07/2020 for return consultation with significant worsening edema, cough, and profound dyspnea.  He had noted some edema over the last 2 to 3 months but much worse over the past 4 weeks.  He also noted increasing abdominal girth but not necessarily weight change.  Significant exacerbation of his exertional dyspnea, but no real PND orthopnea-other than worsening cough with lying flat..  I was quite concerned about the patient who had a very harsh aortic systolic murmur with decreased carotid impulse, significant 3+ edema and a look of being quite ill. -->  We were able to get an echocardiogram done yesterday and am seeing him back today based on how severe the findings of the echo are.  We started low-dose carvedilol and  Lasix with plans to take 40 mg for 3 days and then reduce to 20 mg daily.  Reviewed  CV studies:    The following studies were reviewed today: (if available, images/films reviewed: From Epic Chart or Care Everywhere)  Echo 02/08/2020: EF 25-30% with severely reduced function global).  Moderately elevated PA pressures (~53 mmHg).  Severe AS with mild to moderate AI (AVA by VTI ~0.49 cm, mean gradient 41 mmHg. V max 3.974m; AI py PHT 465 msec)  Mild MR.  Mildly dilated left atrium.  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  Small circumferential pericardial effusion.  Immunization History  Administered Date(s) Administered  . PFIZER SARS-COV-2 Vaccination 08/27/2019, 09/21/2019   Interval History:   Cesar Bullock presents here feeling a whole lot better.  He lost about 6 to 9 pounds with diuresis.  He has been urinating quite frequently having to go twice during the clinic visit.  Breathing has notably improved and he is not really have any orthopnea anymore.  He indicates that he woke up the next day after a visit having taken Lasix in the evening already feeling better.  Notably less dyspneic. However he still does have fatigue and exertional dyspnea.  No chest pain or pressure.  No palpitations.  He is still unsure as to how he wants to proceed.  Very very very unsure of whether he would want to have any procedures done during my discussion with him.  We reviewed his echocardiogram together and explained to him the  severity of his ejection fraction and the valve stenosis.  The fact that he has a mean gradient of 41 mmHg and an EF of 25 to 30% knee is that it is probably more severe than that.  CV Review of Symptoms (Summary) Cardiovascular ROS: positive for - dyspnea on exertion, edema, palpitations, rapid heart rate, shortness of breath and Still has cough with lying down/Orthopnea.  Overall improved edema and exertional dyspnea.  Heart rate is also  improved negative for - chest pain, irregular heartbeat, palpitations, paroxysmal nocturnal dyspnea or Lightheadedness but no syncope or near syncope.  No TIA or amaurosis fugax.  The patient DOES NOT have symptoms concerning for COVID-19 infection (fever, chills, cough, or new shortness of breath).  The patient is practicing social distancing & Masking.   08/12/2019: SARS-CoV-2 NAA detected (nucleic acid implication test) ([facility])  08/18/2019: Covid + ([facility])  Follow-up tests on 08/25/2019 ([facility])-not detected  01/11/2020, 01/24/2020 SARS-CoV2 RNA-negative    REVIEWED OF SYSTEMS   Review of Systems  Constitutional: Positive for malaise/fatigue and weight loss (6 pounds down in counting.). Negative for chills and fever.  HENT: Negative for congestion and nosebleeds.   Respiratory: Positive for shortness of breath. Negative for cough and wheezing.   Cardiovascular: Positive for leg swelling.  Gastrointestinal: Negative for abdominal pain, blood in stool and melena.  Genitourinary: Negative for frequency and hematuria.  Musculoskeletal: Positive for joint pain.  Neurological: Positive for dizziness (Lightheadedness) and weakness (General). Negative for focal weakness, seizures and headaches.  Endo/Heme/Allergies: Negative for environmental allergies.  Psychiatric/Behavioral: Negative for depression and memory loss. The patient is not nervous/anxious and does not have insomnia.        Seems very fearful of procedures.    I have reviewed and (if needed) personally updated the patient's problem list, medications, allergies, past medical and surgical history, social and family history.   PAST MEDICAL HISTORY   Past Medical History:  Diagnosis Date  . 2019 novel coronavirus disease (COVID-19) 08/12/2019   -Follow-up test on August 25, 2019 not detected.  Again not detected  both July 13 and 26, 2021  . Aortic stenosis 03/20/2017   Mild LVH.  EF~50%.  Severely calcified aortic  valve-moderate AS (AoV velocity 4.4 m/s, AVA~0.6 cm; with mild AI.  MAC with mild MR.  Mild pulmonary hypertension but normal RV size and function.  . Disequilibrium   . Elevated blood pressure reading without diagnosis of hypertension 05/03/2008   Qualifier: Diagnosis of  By: Larose Kells MD, [place] Hyperglycemia 11/09/2012  . HYPERLIPIDEMIA, MILD 04/02/2010   Qualifier: Diagnosis of  By: Larose Kells MD, Alda Berthold Nuclear cataract 2018   Had surgery  . Sensorineural hearing loss, bilateral 2016   Strict hearing loss precautions    PAST SURGICAL HISTORY   Past Surgical History:  Procedure Laterality Date  . Cataract surgery  2018   Dr. Calvert Cantor  . TONSILLECTOMY     age 3  . TRANSTHORACIC ECHOCARDIOGRAM  03/2017   [facility]): Mild LVH with normal LV contraction (LVEF 50%). Severely calcified aortic valve with mild regurgitation and moderate-severe stenosis (AoV velocity 4.4 m/s, AVA 0.6 cm^2). Mitral calcification with mild MR. Mild pulmonary hypertension. Normal RV size and function.  . TRANSTHORACIC ECHOCARDIOGRAM  07/19/2015    Normal LV size with mild LVH. LVEF 55-60% with grade 2 diastolic dysfunction. Moderate AS and mild AI. MAC with mild MR. Mild LA enlargement. Normal RV size and function.  Marland Kitchen  TRANSTHORACIC ECHOCARDIOGRAM  02/11/2020   EF 25-30% with severely reduced function global).  Moderately elevated PA pressures (~53 mmHg).  Severe AS with mild to moderate AI (AVA by VTI ~0.49 cm, mean gradient 41 mmHg. V max 3.37ms; AI py PHT 465 msec)    MEDICATIONS/ALLERGIES   No outpatient medications have been marked as taking for the 02/09/20 encounter (Office Visit) with HLeonie Man MD.    No Known Allergies  SOCIAL HISTORY/FAMILY HISTORY   Social History   Tobacco Use  . Smoking status: Never Smoker  . Smokeless tobacco: Never Used  Vaping Use  . Vaping Use: Never used  Substance Use Topics  . Alcohol use: Not Currently    Alcohol/week: 6.0 standard  drinks    Types: 6 Shots of liquor per week    Comment: vodka when he stopped drinking roughly 3 months ago _0  none none none  . Drug use: No   Social History   Social History Narrative   Lives by himself   6 g-kids      Former [place]      Exercise-minimal   Quit drinking alcohol in March 2021   Family History  Problem Relation Age of Onset  . Diabetes Other        GM  . CAD Brother 518      PCI at age 134   . Stroke Mother 756      Had second stroke at age 10410 died age 10431 . Cancer Father 340      Reportedly sinus cancer  . Colon cancer Neg Hx   . Prostate cancer Neg Hx     OBJCTIVE -PE, EKG, labs   Wt Readings from Last 3 Encounters:  02/09/20 138 lb 3.2 oz (62.7 kg)  02/07/20 147 lb 3.2 oz (66.8 kg)  03/24/17 153 lb (69.4 kg)    Physical Exam: BP 130/82   Pulse 73   Temp (!) 97.2 F (36.2 C)   Ht _1  (1.778 m)   Wt 138 lb 3.2 oz (62.7 kg)   SpO2 98%   BMI 19.83 kg/m  Physical Exam Vitals reviewed.  Constitutional:      General: He is not in acute distress.    Appearance: Normal appearance. He is normal weight. He is ill-appearing (Thin, frail but nontoxic.  Seems more upbeat than last visit.).  HENT:     Head: Normocephalic and atraumatic.     Comments: Appropriately wearing mask covering Neck:     Vascular: Decreased carotid pulses (Parvus et tardus). Hepatojugular reflux and JVD present. No carotid bruit (Radiated aortic murmur).  Cardiovascular:     Rate and Rhythm: Normal rate and regular rhythm.  No extrasystoles are present.    Chest Wall: PMI is displaced (Slightly lateral displacement).     Pulses: Decreased pulses (Diminished because of pedal edema, palpable).     Heart sounds: S1 normal. Heart sounds are distant (Normal S1 with nondistended S2.). Murmur heard.  Harsh crescendo-decrescendo mid to late systolic murmur is present with a grade of 4/6 at the upper right sternal border radiating to the  neck.  No friction rub. Gallop present. S4 sounds: Cannot exclude soft S4.      Comments: Rate on EKG is faster than on exam Abdominal:     General: Bowel sounds are normal. There is no distension.     Palpations: Abdomen is soft. There is no mass (Cannot exclude hepatomegaly).  Tenderness: There is abdominal tenderness. There is no right CVA tenderness, left CVA tenderness, guarding or rebound.     Comments: Slightly protuberant and firm abdomen with mild succussion  Musculoskeletal:        General: Swelling (Still wearing support stockings, but edema is notably improved no longer 3+.) present.     Cervical back: Normal range of motion and neck supple.  Neurological:     General: No focal deficit present.     Mental Status: He is alert and oriented to person, place, and time.     Cranial Nerves: No cranial nerve deficit.  Psychiatric:        Mood and Affect: Mood normal.        Behavior: Behavior normal.        Thought Content: Thought content normal.        Judgment: Judgment normal.     Adult ECG Report n/a  Lab Results  Component Value Date   CREATININE 1.10 02/11/2020   BUN 20 02/11/2020   NA 143 02/11/2020   K 4.3 02/11/2020   CL 100 02/11/2020   CO2 29 02/11/2020   BNP: 2618  Recent Labs: 01/27/2020  Na+ 138, K+ 4.8, Cl- 108, HCO3- 22, BUN 29, Cr 1.2, Glu 99, Ca2+ 9.8; AST 54, ALT 207, AlkP 86; Tprot 6.2, Alb 3.8  CBC: W 10.72, H/H 14.1/44, Plt 135; TSH 2.6  01/14/2020  CBC: W 6.2, H/H 12.8/35.8, Plt 127; A1c 6.0, PSA 1.3  08/06/2019: TC 223, TG 104, HDL 61, LDL 144; A1c 5.8.  CXR 01/13/2020: Small loculated pleural effusions bilaterally with bibasal atelectasis.  Otherwise lungs clear.  Upper limit of normal heart size.  No adenopathy.  Aortic atherosclerosis.   ASSESSMENT/PLAN    Problem List Items Addressed This Visit    Symptomatic severe aortic stenosis with normal ejection fraction (Chronic)    Unfortunately, he now has progressed to severe aortic  stenosis and has developed severe cardiomyopathy as well.  The mean gradient as it stands likely underestimates the valve stenosis based on low EF.  Cesar Bullock really was having hard time come to grips with the extent of how bad his valve is.  He asked multiple times if we could just adjust the medication to treat him medically and see how things go.  I try to explain to him that the likelihood of him getting a meaningful recovery is not there.  The valve will continue to get worse as well his EF.  We would always continue to treat his symptoms, but that would not change his long-term outcome.  His longtime significant other who is with him today did show some frustration with him and his inability to comprehend.  He said it was not that he was not able to comprehend, he just was not sure that he had in him to go through major procedures. --> However left of the end of the visit was that we would continue to adjust medications, he would take 40 mg Lasix for least another 3 days before reducing dose and then increase to 40 mg as needed based on sliding scale.  We added losartan for afterload reduction (would like to potentially titrate to [place] based on how he responds--based on his frailty, I did not want to start off with Entresto first)  We would plan to follow-up in 2 to 65month with either myself or an APP.  At any time if he chose to proceed with AVR/TAVR work-up with cardiac catheterization and TEE, we  could have a scheduled (as long as it was within 1 month of this visit, he would not to be seen back).       Relevant Medications   losartan (COZAAR) 25 MG tablet   Other Relevant Orders   Basic metabolic panel (Completed)   Brain natriuretic peptide (Completed)   Hyperlipidemia with target LDL less than 100 (Chronic)    LDL is 144.  I suspect that we will need to start treating his lipids, but at present would not want to start to any medications that may potentially cause symptoms of  fatigue.  No doubt elevated LFTs are probably related to hepatic congestion from CHF. During the next few stages of medication titration will have labs checked and reevaluate liver function.  How aggressive we need to be will depend on his decision about proceeding with TAVR evaluation.  I would not see any need for being aggressive treating his lipids if he is not going to proceed with TAVR.      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  He has had a pretty impressive response to furosemide and carvedilol, but as we discussed, we will need aggressive medical management simply for symptoms but would not get better unless he has a valve replacement.  Plan:  Continue carvedilol 3.125 mg twice daily -> will allow [place] to initiate ARB.  We will start her losartan 25 mg daily with plans to convert to [facility] if he is able to tolerate losartan.  Continue Lasix 40 mill him daily for the next 3 days and then reduce to 20 mg daily with as needed dosing of additional 20 mg for weight gain greater than 3 pounds.  Will need close follow-up and continue to discuss whether or not he will go forward with TAVR evaluation.  With starting an ARB and increasing Lasix, we will check a BNP and BMP today He will need relatively close follow-up for titration of medications.       Relevant Medications   losartan (COZAAR) 25 MG tablet   Dilated cardiomyopathy (HCC)    Somewhat surprising how low his EF truly is.  Were not sure if this is all valvular or could be potentially ischemic.  Would definitely warrant RIGHT & LEFT HEART CATHETERIZATION as part of cardiomyopathy evaluation but also TAVR evaluation.  We started a beta-blocker now adding ARB that  would hopefully be converted to [facility] if blood pressure tolerates. Would also need to consider spironolactone.   He will need to be seen in close follow-up-and continue to discuss concerns about the need for TAVR evaluation.      Relevant Medications   losartan (COZAAR) 25 MG tablet   Abnormal EKG - Primary   Relevant Orders   Basic metabolic panel (Completed)   Brain natriuretic peptide (Completed)      COVID-19 Education: The signs and symptoms of COVID-19 were discussed with the patient and how to seek care for testing (follow up with PCP or arrange E-visit).   The importance of social distancing and COVID-19 vaccination was discussed today.  I spent a total of 34 minutes with the patient. >  50% of the time was spent in direct patient consultation.  Additional time spent with chart review  /  charting (studies, outside notes, etc): 12 Total Time: 34mn   Current medicines are reviewed at length with the patient today.  (+/- concerns) no concerns of medications, but significant concerns about what he wants to going forward.  Notice: This dictation was prepared with Dragon dictation along with smaller phrase technology. Any transcriptional errors that result from this process are unintentional and may not be corrected upon review.  Patient Instructions / Medication Changes & Studies & Tests Ordered   Patient Instructions  Medication Instructions:   FOR THE NEXT 2 DAYS  TAKE  FUROSEMIDE 20 MG TWICE A DAY  ( MORNING AND LUNCH)  THEN DECREASE TO 20 MG DAILY.  LOSARTAN 25 MG ONE TABLET DAILY    WEIGH YOURSELF DAILY IF YOU GAIN 3 OR MORE POUNDS  OVERNIGHT  TAKE AN ADDITIONAL FUROSEMIDE 20 MG *If you need a refill on your cardiac medications before your next appointment, please call your pharmacy*   Lab Work:  BNP BMP today  If you have labs (blood work) drawn today and your tests are completely normal, you will receive your results only by: .Marland KitchenMyChart Message (if you have  MyChart) OR . A paper copy in the mail If you have any lab test that is abnormal or we need to change your treatment, we will call you to review the results.   Testing/Procedures: Not needed   Follow-Up: At [facility] and your [facility] needs are our priority.  As part of our continuing mission to provide you with exceptional heart care, we have created designated Provider Care Teams.  These Care Teams include your primary Cardiologist (physician) and Advanced Practice Providers (APPs -  Physician Assistants and Nurse Practitioners) who all work together to provide you with the care you need, when you need it.  We recommend signing up for the patient portal called "MyChart".  Sign up information is provided on this After Visit Summary.  MyChart is used to connect with patients for Virtual Visits (Telemedicine).  Patients are able to view lab/test results, encounter notes, upcoming appointments, etc.  Non-urgent messages can be sent to your provider as well.   To learn more about what you can do with MyChart, go to [email or web address]    Your next appointment:   2 week(s)  The format for your next appointment:    either  office or virtual   Provider:   DGlenetta Hew MD   Other Instructions    CONTACT OFFICE IF YOU DECIDE TO PROCEED WITH FURTHER TESTING  TRASNESOPHAGEAL  ECHOCARDIOGRAM AND RIGHT AND LEFT HEART CATHETERIZATION    Studies Ordered:   Orders Placed This Encounter  Procedures  . Basic metabolic panel  . Brain natriuretic peptide   ADDENDUM:  The following day, we received a call from Mr. Suydam's girlfriend who indicated that he now has agreed to go forward with TAVR evaluation.  They had a long talk when he went home, and he voiced his fears and concerns, but was able to understand that he has a chance for a better quality and prolong life if he proceeds with TAVR, but would not likely have a meaningful recovery otherwise.  Based on this, he will be  scheduled for TEE followed by [place] February 16, 2020.   Performing MD:  DGlenetta Hew M.D., M.S.  Procedure:  RIGHT & LEFT HEART CATHETERIZATION WITH CORONARY ANGIOGRAPHY; TRANSTHORACIC ECHOCARDIOGRAM  The procedure with Risks/Benefits/Alternatives and Indications was reviewed with the patient & long-term girlfirend.  All questions were answered.    CATH: Risks / Complications include, but not limited to: Death, MI, CVA/TIA, VF/VT (with defibrillation), Bradycardia (need for temporary pacer placement), contrast induced nephropathy, bleeding / bruising / hematoma / pseudoaneurysm, vascular or coronary injury (with possible emergent CT or Vascular Surgery), adverse medication reactions, infection.  Additional risks involving the use of radiation with the possibility of radiation burns and cancer were explained in detail.  TEE: Risks/complications include but not limited to: Death/CVA/TIA, esophageal tear, tracheal intubation or injury.  The patient and friend voice understanding and agree to proceed.      Glenetta Hew, M.D., M.S. Interventional Cardiologist   Pager # [phone] Phone # [phone] [address] [place], [place] [address]   Thank you for choosing Heartcare at [facility]!!

## 2020-02-09 NOTE — Patient Instructions (Signed)
Medication Instructions:   FOR THE NEXT 2 DAYS  TAKE  FUROSEMIDE 20 MG TWICE A DAY  ( MORNING AND LUNCH)  THEN DECREASE TO 20 MG DAILY.  LOSARTAN 25 MG ONE TABLET DAILY    WEIGH YOURSELF DAILY IF YOU GAIN 3 OR MORE POUNDS  OVERNIGHT  TAKE AN ADDITIONAL FUROSEMIDE 20 MG *If you need a refill on your cardiac medications before your next appointment, please call your pharmacy*   Lab Work:  BNP BMP today  If you have labs (blood work) drawn today and your tests are completely normal, you will receive your results only by: Marland Kitchen MyChart Message (if you have MyChart) OR . A paper copy in the mail If you have any lab test that is abnormal or we need to change your treatment, we will call you to review the results.   Testing/Procedures: Not needed   Follow-Up: At [facility], you and your health needs are our priority.  As part of our continuing mission to provide you with exceptional heart care, we have created designated Provider Care Teams.  These Care Teams include your primary Cardiologist (physician) and Advanced Practice Providers (APPs -  Physician Assistants and Nurse Practitioners) who all work together to provide you with the care you need, when you need it.  We recommend signing up for the patient portal called "MyChart".  Sign up information is provided on this After Visit Summary.  MyChart is used to connect with patients for Virtual Visits (Telemedicine).  Patients are able to view lab/test results, encounter notes, upcoming appointments, etc.  Non-urgent messages can be sent to your provider as well.   To learn more about what you can do with MyChart, go to [email or web address].    Your next appointment:   2 week(s)  The format for your next appointment:    either  office or virtual   Provider:   Glenetta Hew, MD   Other Instructions    CONTACT OFFICE IF YOU DECIDE TO PROCEED WITH FURTHER TESTING  TRASNESOPHAGEAL  ECHOCARDIOGRAM AND RIGHT AND LEFT HEART  CATHETERIZATION

## 2020-02-10 ENCOUNTER — Ambulatory Visit: Payer: Medicare Other | Admitting: Cardiology

## 2020-02-10 ENCOUNTER — Telehealth: Payer: Self-pay | Admitting: Cardiology

## 2020-02-10 DIAGNOSIS — I350 Nonrheumatic aortic (valve) stenosis: Secondary | ICD-10-CM

## 2020-02-10 DIAGNOSIS — Z01818 Encounter for other preprocedural examination: Secondary | ICD-10-CM

## 2020-02-10 DIAGNOSIS — I509 Heart failure, unspecified: Secondary | ICD-10-CM

## 2020-02-10 DIAGNOSIS — R0609 Other forms of dyspnea: Secondary | ICD-10-CM

## 2020-02-10 DIAGNOSIS — R9431 Abnormal electrocardiogram [ECG] [EKG]: Secondary | ICD-10-CM

## 2020-02-10 LAB — BASIC METABOLIC PANEL
BUN/Creatinine Ratio: 19 (ref 10–24)
BUN: 23 mg/dL (ref 8–27)
CO2: 19 mmol/L — ABNORMAL LOW (ref 20–29)
Calcium: 9.7 mg/dL (ref 8.6–10.2)
Chloride: 102 mmol/L (ref 96–106)
Creatinine, Ser: 1.19 mg/dL (ref 0.76–1.27)
GFR calc Af Amer: 68 mL/min/{1.73_m2} (ref >59)
GFR calc non Af Amer: 59 mL/min/{1.73_m2} — ABNORMAL LOW (ref >59)
Glucose: 88 mg/dL (ref 65–99)
Potassium: 5.5 mmol/L — ABNORMAL HIGH (ref 3.5–5.2)
Sodium: 138 mmol/L (ref 134–144)

## 2020-02-10 LAB — BRAIN NATRIURETIC PEPTIDE: BNP: 2618.4 pg/mL — ABNORMAL HIGH (ref 0.0–100.0)

## 2020-02-10 NOTE — Telephone Encounter (Signed)
Spoke Ms Luciana Axe. She states  Mr Fyock is in agreement to have  TEE and right and left heart cath as soon as possible. Patient request for Dr Ellyn Hack to do  Cath. Patient will needs something for anxiety . He is very anxious about upcoming procedure. RN informed Ms Luciana Axe will schedule  upcoming procedures and notify Dr Ellyn Hack of patient request.   will contact patient with the information once completed. She verbalized understanding.

## 2020-02-10 NOTE — Telephone Encounter (Signed)
Cesar Bullock called and said that it's very important that she talk with Dr. Allison Quarry nurse regarding his tests. Please call back

## 2020-02-11 DIAGNOSIS — I350 Nonrheumatic aortic (valve) stenosis: Secondary | ICD-10-CM | POA: Diagnosis not present

## 2020-02-11 DIAGNOSIS — R9431 Abnormal electrocardiogram [ECG] [EKG]: Secondary | ICD-10-CM | POA: Diagnosis not present

## 2020-02-11 DIAGNOSIS — Z01818 Encounter for other preprocedural examination: Secondary | ICD-10-CM | POA: Diagnosis not present

## 2020-02-11 DIAGNOSIS — I509 Heart failure, unspecified: Secondary | ICD-10-CM | POA: Diagnosis not present

## 2020-02-11 DIAGNOSIS — R0600 Dyspnea, unspecified: Secondary | ICD-10-CM | POA: Diagnosis not present

## 2020-02-11 HISTORY — PX: TRANSTHORACIC ECHOCARDIOGRAM: SHX275

## 2020-02-11 MED ORDER — LORAZEPAM 0.5 MG PO TABS
ORAL_TABLET | ORAL | 0 refills | Status: DC
Start: 2020-02-11 — End: 2020-02-21

## 2020-02-11 NOTE — Telephone Encounter (Signed)
Patient has TEE and right/left heart cath on 8/18.   Will MD prescribe something for anxiety per message below?

## 2020-02-11 NOTE — Telephone Encounter (Signed)
Follow up     Pt is very, very, very anxious about upcoming procedure.  Is there something he can start taking now to get him thru the weekend and procedure date?  If yes, please call it in to [facility] and they will pick it up today.  If there is a problem, please call back.

## 2020-02-11 NOTE — Telephone Encounter (Addendum)
Spoke to Patient instruction for cath and tee given. Patient came to office for labs - CBC,BMP Covid test schedule for Saturday Aug 14 , 2021.   verbal order called into pharmacy for generic ativan

## 2020-02-12 ENCOUNTER — Other Ambulatory Visit (HOSPITAL_COMMUNITY)
Admission: RE | Admit: 2020-02-12 | Discharge: 2020-02-12 | Disposition: A | Discharge status: Home / Self Care | Payer: Medicare Other | Source: Ambulatory Visit | Attending: Cardiology | Admitting: Cardiology

## 2020-02-12 ENCOUNTER — Encounter: Payer: Self-pay | Admitting: Cardiology

## 2020-02-12 DIAGNOSIS — I420 Dilated cardiomyopathy: Secondary | ICD-10-CM | POA: Insufficient documentation

## 2020-02-12 DIAGNOSIS — Z01812 Encounter for preprocedural laboratory examination: Secondary | ICD-10-CM | POA: Insufficient documentation

## 2020-02-12 DIAGNOSIS — Z20822 Contact with and (suspected) exposure to covid-19: Secondary | ICD-10-CM | POA: Insufficient documentation

## 2020-02-12 LAB — BASIC METABOLIC PANEL
BUN/Creatinine Ratio: 18 (ref 10–24)
BUN: 20 mg/dL (ref 8–27)
CO2: 29 mmol/L (ref 20–29)
Calcium: 9.6 mg/dL (ref 8.6–10.2)
Chloride: 100 mmol/L (ref 96–106)
Creatinine, Ser: 1.1 mg/dL (ref 0.76–1.27)
GFR calc Af Amer: 74 mL/min/{1.73_m2} (ref >59)
GFR calc non Af Amer: 64 mL/min/{1.73_m2} (ref >59)
Glucose: 82 mg/dL (ref 65–99)
Potassium: 4.3 mmol/L (ref 3.5–5.2)
Sodium: 143 mmol/L (ref 134–144)

## 2020-02-12 LAB — CBC
Hematocrit: 41.7 % (ref 37.5–51.0)
Hemoglobin: 13.8 g/dL (ref 13.0–17.7)
MCH: 27.8 pg (ref 26.6–33.0)
MCHC: 33.1 g/dL (ref 31.5–35.7)
MCV: 84 fL (ref 79–97)
Platelets: 228 10*3/uL (ref 150–450)
RBC: 4.96 x10E6/uL (ref 4.14–5.80)
RDW: 14 % (ref 11.6–15.4)
WBC: 5.9 10*3/uL (ref 3.4–10.8)

## 2020-02-12 LAB — SARS CORONAVIRUS 2 (TAT 6-24 HRS): SARS Coronavirus 2: NEGATIVE

## 2020-02-12 NOTE — Assessment & Plan Note (Addendum)
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  He has had a pretty impressive response to furosemide and carvedilol, but as we discussed, we will need aggressive medical management simply for symptoms but would not get better unless he has a valve replacement.  Plan:  Continue carvedilol 3.125 mg twice daily -> will allow [place] to initiate ARB.  We will start her losartan 25 mg daily with plans to convert to [facility] if he is able to tolerate losartan.  Continue Lasix 40 mill him daily for the next 3 days and then reduce to 20 mg daily with as needed dosing of additional 20 mg for weight gain greater than 3 pounds.  Will need close follow-up and continue to discuss whether or not he will go forward with TAVR evaluation.  With starting an ARB and increasing Lasix, we will check a BNP and BMP today He will need relatively close follow-up for titration of medications.

## 2020-02-12 NOTE — Assessment & Plan Note (Signed)
Somewhat surprising how low his EF truly is.  Were not sure if this is all valvular or could be potentially ischemic.  Would definitely warrant RIGHT & LEFT HEART CATHETERIZATION as part of cardiomyopathy evaluation but also TAVR evaluation.  We started a beta-blocker now adding ARB that would hopefully be converted to [facility] if blood pressure tolerates. Would also need to consider spironolactone.   He will need to be seen in close follow-up-and continue to discuss concerns about the need for TAVR evaluation.

## 2020-02-12 NOTE — Assessment & Plan Note (Signed)
LDL is 144.  I suspect that we will need to start treating his lipids, but at present would not want to start to any medications that may potentially cause symptoms of fatigue.  No doubt elevated LFTs are probably related to hepatic congestion from CHF. During the next few stages of medication titration will have labs checked and reevaluate liver function.  How aggressive we need to be will depend on his decision about proceeding with TAVR evaluation.  I would not see any need for being aggressive treating his lipids if he is not going to proceed with TAVR.

## 2020-02-12 NOTE — Assessment & Plan Note (Signed)
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  The mean gradient as it stands likely underestimates the valve stenosis based on low EF.  Cesar Bullock really was having hard time come to grips with the extent of how bad his valve is.  He asked multiple times if we could just adjust the medication to treat him medically and see how things go.  I try to explain to him that the likelihood of him getting a meaningful recovery is not there.  The valve will continue to get worse as well his EF.  We would always continue to treat his symptoms, but that would not change his long-term outcome.  His longtime significant other who is with him today did show some frustration with him and his inability to comprehend.  He said it was not that he was not able to comprehend, he just was not sure that he had in him to go through major procedures. --> However left of the end of the visit was that we would continue to adjust medications, he would take 40 mg Lasix for least another 3 days before reducing dose and then increase to 40 mg as needed based on sliding scale.  We added losartan for afterload reduction (would like to potentially titrate to [place] based on how he responds--based on his frailty, I did not want to start off with Entresto first)  We would plan to follow-up in 2 to 66months with either myself or an APP.  At any time if he chose to proceed with AVR/TAVR work-up with cardiac catheterization and TEE, we could have a scheduled (as long as it was within 1 month of this visit, he would not to be seen back).

## 2020-02-14 ENCOUNTER — Other Ambulatory Visit: Payer: Self-pay | Admitting: *Deleted

## 2020-02-14 DIAGNOSIS — R0609 Other forms of dyspnea: Secondary | ICD-10-CM

## 2020-02-14 DIAGNOSIS — I509 Heart failure, unspecified: Secondary | ICD-10-CM

## 2020-02-14 DIAGNOSIS — I350 Nonrheumatic aortic (valve) stenosis: Secondary | ICD-10-CM

## 2020-02-14 MED ORDER — SODIUM CHLORIDE 0.9% FLUSH: 3.0000 mL | Freq: Two times a day (BID) | INTRAVENOUS | Status: DC

## 2020-02-15 ENCOUNTER — Telehealth: Payer: Self-pay | Admitting: *Deleted

## 2020-02-15 NOTE — Anesthesia Preprocedure Evaluation (Addendum)
Anesthesia Evaluation  Patient identified by MRN, date of birth, ID band Patient awake    Reviewed: Allergy & Precautions, NPO status , Patient's Chart, lab work & pertinent test results  Airway Mallampati: II  TM Distance: >3 FB Neck ROM: Full    Dental  (+) Teeth Intact, Dental Advisory Given   Pulmonary neg pulmonary ROS,  H/o Covid   Pulmonary exam normal breath sounds clear to auscultation       Cardiovascular hypertension, Pt. on medications and Pt. on home beta blockers + DOE  + Valvular Problems/Murmurs AS  Rhythm:Regular Rate:Normal + Systolic murmurs    Neuro/Psych negative neurological ROS     GI/Hepatic negative GI ROS, Neg liver ROS,   Endo/Other  negative endocrine ROS  Renal/GU negative Renal ROS     Musculoskeletal negative musculoskeletal ROS (+)   Abdominal   Peds  Hematology negative hematology ROS (+)   Anesthesia Other Findings   Reproductive/Obstetrics                            Anesthesia Physical Anesthesia Plan  ASA: IV  Anesthesia Plan: MAC   Post-op Pain Management:    Induction: Intravenous  PONV Risk Score and Plan: 1 and Propofol infusion and Treatment may vary due to age or medical condition  Airway Management Planned: Natural Airway and Nasal Cannula  Additional Equipment:   Intra-op Plan:   Post-operative Plan:   Informed Consent: I have reviewed the patients History and Physical, chart, labs and discussed the procedure including the risks, benefits and alternatives for the proposed anesthesia with the patient or authorized representative who has indicated his/her understanding and acceptance.     Dental advisory given  Plan Discussed with: CRNA  Anesthesia Plan Comments:        Anesthesia Quick Evaluation

## 2020-02-15 NOTE — Telephone Encounter (Signed)
Pt contacted pre-catheterization scheduled at [facility] for: Wednesday February 16, 2020 1:30 PM/TEE 9 AM Verified arrival time and place: [facility]) at: 8 AM   Nothing to eat or drink after midnight prior to procedures, may have sips of water to take medications.  Hold: Lasix-AM of procedure  Except hold medication AM meds can be  taken pre-cath with sips of water including: ASA 81 mg   Confirmed patient has responsible adult to drive home post procedure and observe 24 hours after arriving home: yes  You are allowed ONE visitor in the waiting room during your procedure. Both you and your visitor must wear a mask once you enter the hospital.       COVID-19 Pre-Screening Questions:   In the past 14 days have you had a new cough, shortness of breath, headache, congestion, fever (100 or greater) unexplained body aches, new sore throat, or sudden loss of taste or sense of smell? no  In the past 14  days have you been around anyone with known Covid 19? no  Have you been vaccinated for COVID-19? Yes, see immunization history   Reviewed procedure/mask/visitor instructions, COVID-19 questions with patient.

## 2020-02-16 ENCOUNTER — Ambulatory Visit (HOSPITAL_COMMUNITY): Payer: Medicare Other | Admitting: Anesthesiology

## 2020-02-16 ENCOUNTER — Ambulatory Visit (HOSPITAL_BASED_OUTPATIENT_CLINIC_OR_DEPARTMENT_OTHER): Payer: Medicare Other

## 2020-02-16 ENCOUNTER — Ambulatory Visit (HOSPITAL_COMMUNITY)
Admission: RE | Admit: 2020-02-16 | Discharge: 2020-02-16 | Disposition: A | Discharge status: Home / Self Care | Payer: Medicare Other | Attending: Internal Medicine | Admitting: Internal Medicine

## 2020-02-16 ENCOUNTER — Encounter (HOSPITAL_COMMUNITY)
Admission: RE | Disposition: A | Discharge status: Home / Self Care | Payer: Self-pay | Source: Home / Self Care | Attending: Internal Medicine

## 2020-02-16 ENCOUNTER — Encounter (HOSPITAL_COMMUNITY)
Admission: RE | Disposition: A | Discharge status: Home / Self Care | Payer: Medicare Other | Source: Home / Self Care | Attending: Internal Medicine

## 2020-02-16 ENCOUNTER — Encounter (HOSPITAL_COMMUNITY): Payer: Self-pay | Admitting: Internal Medicine

## 2020-02-16 DIAGNOSIS — I5041 Acute combined systolic (congestive) and diastolic (congestive) heart failure: Secondary | ICD-10-CM | POA: Diagnosis not present

## 2020-02-16 DIAGNOSIS — I509 Heart failure, unspecified: Secondary | ICD-10-CM

## 2020-02-16 DIAGNOSIS — Z8616 Personal history of COVID-19: Secondary | ICD-10-CM | POA: Insufficient documentation

## 2020-02-16 DIAGNOSIS — R0609 Other forms of dyspnea: Secondary | ICD-10-CM

## 2020-02-16 DIAGNOSIS — I272 Pulmonary hypertension, unspecified: Secondary | ICD-10-CM | POA: Insufficient documentation

## 2020-02-16 DIAGNOSIS — I351 Nonrheumatic aortic (valve) insufficiency: Secondary | ICD-10-CM | POA: Diagnosis not present

## 2020-02-16 DIAGNOSIS — I350 Nonrheumatic aortic (valve) stenosis: Secondary | ICD-10-CM | POA: Diagnosis not present

## 2020-02-16 DIAGNOSIS — E785 Hyperlipidemia, unspecified: Secondary | ICD-10-CM | POA: Diagnosis not present

## 2020-02-16 DIAGNOSIS — I342 Nonrheumatic mitral (valve) stenosis: Secondary | ICD-10-CM | POA: Diagnosis not present

## 2020-02-16 DIAGNOSIS — I420 Dilated cardiomyopathy: Secondary | ICD-10-CM | POA: Diagnosis not present

## 2020-02-16 DIAGNOSIS — Z79899 Other long term (current) drug therapy: Secondary | ICD-10-CM | POA: Insufficient documentation

## 2020-02-16 HISTORY — PX: RIGHT/LEFT HEART CATH AND CORONARY ANGIOGRAPHY: CATH118266

## 2020-02-16 HISTORY — PX: BUBBLE STUDY: SHX6837

## 2020-02-16 HISTORY — PX: TEE WITHOUT CARDIOVERSION: SHX5443

## 2020-02-16 HISTORY — PX: CARDIAC CATHETERIZATION: SHX172

## 2020-02-16 LAB — POCT I-STAT EG7
Acid-Base Excess: 3 mmol/L — ABNORMAL HIGH (ref 0.0–2.0)
Acid-Base Excess: 2 mmol/L (ref 0.0–2.0)
Acid-Base Excess: 3 mmol/L — ABNORMAL HIGH (ref 0.0–2.0)
Bicarbonate: 27.9 mmol/L (ref 20.0–28.0)
Bicarbonate: 27.7 mmol/L (ref 20.0–28.0)
Bicarbonate: 28.2 mmol/L — ABNORMAL HIGH (ref 20.0–28.0)
Calcium, Ion: 1.3 mmol/L (ref 1.15–1.40)
Calcium, Ion: 1.23 mmol/L (ref 1.15–1.40)
Calcium, Ion: 1.33 mmol/L (ref 1.15–1.40)
HCT: 37 % — ABNORMAL LOW (ref 39.0–52.0)
HCT: 35 % — ABNORMAL LOW (ref 39.0–52.0)
HCT: 36 % — ABNORMAL LOW (ref 39.0–52.0)
Hemoglobin: 12.6 g/dL — ABNORMAL LOW (ref 13.0–17.0)
Hemoglobin: 11.9 g/dL — ABNORMAL LOW (ref 13.0–17.0)
Hemoglobin: 12.2 g/dL — ABNORMAL LOW (ref 13.0–17.0)
O2 Saturation: 59 %
O2 Saturation: 51 %
O2 Saturation: 57 %
Potassium: 3.6 mmol/L (ref 3.5–5.1)
Potassium: 3.4 mmol/L — ABNORMAL LOW (ref 3.5–5.1)
Potassium: 3.6 mmol/L (ref 3.5–5.1)
Sodium: 141 mmol/L (ref 135–145)
Sodium: 141 mmol/L (ref 135–145)
Sodium: 143 mmol/L (ref 135–145)
TCO2: 29 mmol/L (ref 22–32)
TCO2: 29 mmol/L (ref 22–32)
TCO2: 30 mmol/L (ref 22–32)
pCO2, Ven: 44.4 mmHg (ref 44.0–60.0)
pCO2, Ven: 44.6 mmHg (ref 44.0–60.0)
pCO2, Ven: 46.5 mmHg (ref 44.0–60.0)
pH, Ven: 7.406 (ref 7.250–7.430)
pH, Ven: 7.392 (ref 7.250–7.430)
pH, Ven: 7.402 (ref 7.250–7.430)
pO2, Ven: 31 mmHg — CL (ref 32.0–45.0)
pO2, Ven: 27 mmHg — CL (ref 32.0–45.0)
pO2, Ven: 30 mmHg — CL (ref 32.0–45.0)

## 2020-02-16 LAB — POCT I-STAT 7, (LYTES, BLD GAS, ICA,H+H)
Acid-Base Excess: 1 mmol/L (ref 0.0–2.0)
Bicarbonate: 26.1 mmol/L (ref 20.0–28.0)
Calcium, Ion: 1.26 mmol/L (ref 1.15–1.40)
HCT: 37 % — ABNORMAL LOW (ref 39.0–52.0)
Hemoglobin: 12.6 g/dL — ABNORMAL LOW (ref 13.0–17.0)
O2 Saturation: 95 %
Potassium: 3.6 mmol/L (ref 3.5–5.1)
Sodium: 141 mmol/L (ref 135–145)
TCO2: 27 mmol/L (ref 22–32)
pCO2 arterial: 40.5 mmHg (ref 32.0–48.0)
pH, Arterial: 7.418 (ref 7.350–7.450)
pO2, Arterial: 76 mmHg — ABNORMAL LOW (ref 83.0–108.0)

## 2020-02-16 SURGERY — ECHOCARDIOGRAM, TRANSESOPHAGEAL
Anesthesia: Monitor Anesthesia Care

## 2020-02-16 SURGERY — RIGHT/LEFT HEART CATH AND CORONARY ANGIOGRAPHY
Anesthesia: LOCAL

## 2020-02-16 MED ORDER — PHENYLEPHRINE HCL (PRESSORS) 10 MG/ML IV SOLN
INTRAVENOUS | Status: DC | PRN
Start: 2020-02-16 — End: 2020-02-16
  Administered 2020-02-16 (×2): 80 ug via INTRAVENOUS
  Administered 2020-02-16: 40 ug via INTRAVENOUS
  Administered 2020-02-16: 120 ug via INTRAVENOUS
  Administered 2020-02-16: 80 ug via INTRAVENOUS

## 2020-02-16 MED ORDER — LABETALOL HCL 5 MG/ML IV SOLN: 10.0000 mg | INTRAVENOUS | Status: DC | PRN

## 2020-02-16 MED ORDER — VERAPAMIL HCL 2.5 MG/ML IV SOLN
INTRAVENOUS | Status: AC
  Filled 2020-02-16: qty 2

## 2020-02-16 MED ORDER — HEPARIN SODIUM (PORCINE) 1000 UNIT/ML IJ SOLN
INTRAMUSCULAR | Status: DC | PRN
  Administered 2020-02-16: 4000 [IU] via INTRAVENOUS

## 2020-02-16 MED ORDER — HEPARIN (PORCINE) IN NACL 1000-0.9 UT/500ML-% IV SOLN
INTRAVENOUS | Status: DC | PRN
  Administered 2020-02-16 (×3): 500 mL

## 2020-02-16 MED ORDER — SODIUM CHLORIDE 0.9 % IV SOLN: INTRAVENOUS | Status: DC

## 2020-02-16 MED ORDER — SODIUM CHLORIDE 0.9% FLUSH: 3.0000 mL | Freq: Two times a day (BID) | INTRAVENOUS | Status: DC

## 2020-02-16 MED ORDER — ONDANSETRON HCL 4 MG/2ML IJ SOLN: 4.0000 mg | Freq: Four times a day (QID) | INTRAMUSCULAR | Status: DC | PRN

## 2020-02-16 MED ORDER — MIDAZOLAM HCL 2 MG/2ML IJ SOLN
INTRAMUSCULAR | Status: AC
  Filled 2020-02-16: qty 2

## 2020-02-16 MED ORDER — MIDAZOLAM HCL 2 MG/2ML IJ SOLN
INTRAMUSCULAR | Status: DC | PRN
  Administered 2020-02-16: 2 mg via INTRAVENOUS

## 2020-02-16 MED ORDER — PROPOFOL 500 MG/50ML IV EMUL
INTRAVENOUS | Status: DC | PRN
  Administered 2020-02-16: 50 ug/kg/min via INTRAVENOUS

## 2020-02-16 MED ORDER — HEPARIN (PORCINE) IN NACL 1000-0.9 UT/500ML-% IV SOLN
INTRAVENOUS | Status: AC
  Filled 2020-02-16: qty 1000

## 2020-02-16 MED ORDER — FENTANYL CITRATE (PF) 100 MCG/2ML IJ SOLN
INTRAMUSCULAR | Status: DC | PRN
  Administered 2020-02-16: 25 ug via INTRAVENOUS

## 2020-02-16 MED ORDER — ASPIRIN 81 MG PO CHEW: 81.0000 mg | CHEWABLE_TABLET | ORAL | Status: DC

## 2020-02-16 MED ORDER — IOHEXOL 350 MG/ML SOLN
INTRAVENOUS | Status: DC | PRN
  Administered 2020-02-16: 40 mL

## 2020-02-16 MED ORDER — SODIUM CHLORIDE 0.9 % IV SOLN: 250.0000 mL | INTRAVENOUS | Status: DC | PRN

## 2020-02-16 MED ORDER — SODIUM CHLORIDE 0.9% FLUSH: 3.0000 mL | INTRAVENOUS | Status: DC | PRN

## 2020-02-16 MED ORDER — LIDOCAINE HCL (PF) 1 % IJ SOLN
INTRAMUSCULAR | Status: DC | PRN
  Administered 2020-02-16 (×2): 2 mL

## 2020-02-16 MED ORDER — LIDOCAINE HCL (PF) 1 % IJ SOLN
INTRAMUSCULAR | Status: AC
  Filled 2020-02-16: qty 30

## 2020-02-16 MED ORDER — HYDRALAZINE HCL 20 MG/ML IJ SOLN: 10.0000 mg | INTRAMUSCULAR | Status: DC | PRN

## 2020-02-16 MED ORDER — HEPARIN SODIUM (PORCINE) 1000 UNIT/ML IJ SOLN
INTRAMUSCULAR | Status: AC
  Filled 2020-02-16: qty 1

## 2020-02-16 MED ORDER — ACETAMINOPHEN 325 MG PO TABS: 650.0000 mg | ORAL_TABLET | ORAL | Status: DC | PRN

## 2020-02-16 MED ORDER — BUTAMBEN-TETRACAINE-BENZOCAINE 2-2-14 % EX AERO
INHALATION_SPRAY | CUTANEOUS | Status: DC | PRN
  Administered 2020-02-16: 2 via TOPICAL

## 2020-02-16 MED ORDER — FENTANYL CITRATE (PF) 100 MCG/2ML IJ SOLN
INTRAMUSCULAR | Status: AC
  Filled 2020-02-16: qty 2

## 2020-02-16 MED ORDER — VERAPAMIL HCL 2.5 MG/ML IV SOLN
INTRAVENOUS | Status: DC | PRN
  Administered 2020-02-16: 10 mL via INTRA_ARTERIAL

## 2020-02-16 SURGICAL SUPPLY — 15 items
CATH BALLN WEDGE 5F 110CM (CATHETERS) ×2 IMPLANT
CATH OPTITORQUE TIG 4.0 5F (CATHETERS) ×2 IMPLANT
DEVICE RAD COMP TR BAND LRG (VASCULAR PRODUCTS) ×2 IMPLANT
GLIDESHEATH SLEND SS 6F .021 (SHEATH) ×2 IMPLANT
GUIDEWIRE .025 260CM (WIRE) ×2 IMPLANT
GUIDEWIRE INQWIRE 1.5J.035X260 (WIRE) ×1 IMPLANT
INQWIRE 1.5J .035X260CM (WIRE) ×2
KIT HEART LEFT (KITS) ×2 IMPLANT
PACK CARDIAC CATHETERIZATION (CUSTOM PROCEDURE TRAY) ×2 IMPLANT
SHEATH GLIDE SLENDER 4/5FR (SHEATH) ×2 IMPLANT
SHEATH PROBE COVER 6X72 (BAG) ×2 IMPLANT
TRANSDUCER W/STOPCOCK (MISCELLANEOUS) ×2 IMPLANT
TUBING CIL FLEX 10 FLL-RA (TUBING) ×2 IMPLANT
WIRE EMERALD 3MM-J .025X260CM (WIRE) ×2 IMPLANT
WIRE EMERALD 3MM-J .035X150CM (WIRE) ×2 IMPLANT

## 2020-02-16 NOTE — Transfer of Care (Signed)
Immediate Anesthesia Transfer of Care Note  Patient: Cesar Bullock  Procedure(s) Performed: TRANSESOPHAGEAL ECHOCARDIOGRAM (TEE) (N/A ) BUBBLE STUDY  Patient Location: Endoscopy Unit  Anesthesia Type:MAC  Level of Consciousness: awake, alert  and oriented  Airway & Oxygen Therapy: Patient Spontanous Breathing  Post-op Assessment: Report given to RN and Post -op Vital signs reviewed and stable  Post vital signs: Reviewed and stable  Last Vitals:  Vitals Value Taken Time  BP 93/53 02/16/20 0930  Temp    Pulse 60 02/16/20 0930  Resp 7 02/16/20 0930  SpO2 96 % 02/16/20 0930  Vitals shown include unvalidated device data.  Last Pain:  Vitals:   02/16/20 0750  TempSrc: Temporal  PainSc: 0-No pain         Complications: No complications documented.

## 2020-02-16 NOTE — Discharge Instructions (Signed)
Radial Site Care  This sheet gives you information about how to care for yourself after your procedure. Your health care provider may also give you more specific instructions. If you have problems or questions, contact your health care provider. What can I expect after the procedure? After the procedure, it is common to have:  Bruising and tenderness at the catheter insertion area. Follow these instructions at home: Medicines  Take over-the-counter and prescription medicines only as told by your health care provider. Insertion site care  Follow instructions from your health care provider about how to take care of your insertion site. Make sure you: ? Wash your hands with soap and water before you change your bandage (dressing). If soap and water are not available, use hand sanitizer. ? Change your dressing as told by your health care provider. ? Leave stitches (sutures), skin glue, or adhesive strips in place. These skin closures may need to stay in place for 2 weeks or longer. If adhesive strip edges start to loosen and curl up, you may trim the loose edges. Do not remove adhesive strips completely unless your health care provider tells you to do that.  Check your insertion site every day for signs of infection. Check for: ? Redness, swelling, or pain. ? Fluid or blood. ? Pus or a bad smell. ? Warmth.  Do not take baths, swim, or use a hot tub until your health care provider approves.  You may shower 24-48 hours after the procedure, or as directed by your health care provider. ? Remove the dressing and gently wash the site with plain soap and water. ? Pat the area dry with a clean towel. ? Do not rub the site. That could cause bleeding.  Do not apply powder or lotion to the site. Activity   For 24 hours after the procedure, or as directed by your health care provider: ? Do not flex or bend the affected arm. ? Do not push or pull heavy objects with the affected arm. ? Do not  drive yourself home from the hospital or clinic. You may drive 24 hours after the procedure unless your health care provider tells you not to. ? Do not operate machinery or power tools.  Do not lift anything that is heavier than 10 lb (4.5 kg), or the limit that you are told, until your health care provider says that it is safe.  Ask your health care provider when it is okay to: ? Return to work or school. ? Resume usual physical activities or sports. ? Resume sexual activity. General instructions  If the catheter site starts to bleed, raise your arm and put firm pressure on the site. If the bleeding does not stop, get help right away. This is a medical emergency.  If you went home on the same day as your procedure, a responsible adult should be with you for the first 24 hours after you arrive home.  Keep all follow-up visits as told by your health care provider. This is important. Contact a health care provider if:  You have a fever.  You have redness, swelling, or yellow drainage around your insertion site. Get help right away if:  You have unusual pain at the radial site.  The catheter insertion area swells very fast.  The insertion area is bleeding, and the bleeding does not stop when you hold steady pressure on the area.  Your arm or hand becomes pale, cool, tingly, or numb. These symptoms may represent a serious problem   that is an emergency. Do not wait to see if the symptoms will go away. Get medical help right away. Call your local emergency services (911 in the [place] Do not drive yourself to the hospital. Summary  After the procedure, it is common to have bruising and tenderness at the site.  Follow instructions from your health care provider about how to take care of your radial site wound. Check the wound every day for signs of infection.  Do not lift anything that is heavier than 10 lb (4.5 kg), or the limit that you are told, until your health care provider says  that it is safe. This information is not intended to replace advice given to you by your health care provider. Make sure you discuss any questions you have with your health care provider. Document Revised: 07/23/2017 Document Reviewed: 07/23/2017 Elsevier Patient Education  2020 [employer]  

## 2020-02-16 NOTE — Anesthesia Postprocedure Evaluation (Signed)
Anesthesia Post Note  Patient: Cesar Bullock  Procedure(s) Performed: TRANSESOPHAGEAL ECHOCARDIOGRAM (TEE) (N/A ) BUBBLE STUDY     Patient location during evaluation: Endoscopy Anesthesia Type: MAC Level of consciousness: awake and alert Pain management: pain level controlled Vital Signs Assessment: post-procedure vital signs reviewed and stable Respiratory status: spontaneous breathing, nonlabored ventilation, respiratory function stable and patient connected to nasal cannula oxygen Cardiovascular status: stable and blood pressure returned to baseline Postop Assessment: no apparent nausea or vomiting Anesthetic complications: no   No complications documented.  Last Vitals:  Vitals:   02/16/20 1405 02/16/20 1433  BP: (!) 144/93 (!) 142/85  Pulse: 72   Resp: (!) 26   Temp:    SpO2: 96% 98%    Last Pain:  Vitals:   02/16/20 1433  TempSrc:   PainSc: 0-No pain                 Catalina Gravel

## 2020-02-16 NOTE — Anesthesia Procedure Notes (Signed)
Procedure Name: MAC Date/Time: 02/16/2020 8:33 AM Performed by: Kyung Rudd, CRNA Pre-anesthesia Checklist: Patient identified, Emergency Drugs available, Suction available and Patient being monitored Patient Re-evaluated:Patient Re-evaluated prior to induction Oxygen Delivery Method: Nasal cannula Induction Type: IV induction Placement Confirmation: positive ETCO2 Dental Injury: Teeth and Oropharynx as per pre-operative assessment

## 2020-02-16 NOTE — CV Procedure (Signed)
INDICATIONS: severe aortic valve steonsis  PROCEDURE:   Informed consent was obtained prior to the procedure. The risks, benefits and alternatives for the procedure were discussed and the patient comprehended these risks.  Risks include, but are not limited to, cough, sore throat, vomiting, nausea, somnolence, esophageal and stomach trauma or perforation, bleeding, low blood pressure, aspiration, pneumonia, infection, trauma to the teeth and death.    After a procedural time-out, the oropharynx was anesthetized with 20% benzocaine spray.   During this procedure the patient was administered propofol for sedation Jiles Garter CRNA/Courtney SRNA/Dr. Gifford Shave anesthesia).  The patient's heart rate, blood pressure, and oxygen saturationweare monitored continuously during the procedure. The period of conscious sedation was 45 minutes, of which I was present face-to-face 100% of this time.  The transesophageal probe was inserted in the esophagus and stomach without difficulty and multiple views were obtained.  The patient was kept under observation until the patient left the procedure room.  The patient left the procedure room in stable condition.   Agitated microbubble saline contrast was administered.  COMPLICATIONS:    There were no immediate complications.  FINDINGS:  FORMAL ECHO REPORT PENDING. Severe AS with AV systolic gradient 53 mmHg.  Probable Mild-moderate aortic valve regurgitration - quantitation pending in formal report. Moderate MR Mild TR Mild PR  LVEF 20-25% Moderate RV dysfunction.   No LAA thrombus  RECOMMENDATIONS:     ok to go to cath lab for [facility] when alert.  Time Spent Directly with the Patient:  75 minutes   Cesar Bullock 02/16/2020, 9:46 AM

## 2020-02-16 NOTE — Interval H&P Note (Signed)
History and Physical Interval Note:  02/16/2020 12:57 PM  Cesar Bullock  has presented today for surgery, with the diagnosis of heart failure.  The various methods of treatment have been discussed with the patient and family. After consideration of risks, benefits and other options for treatment, the patient has consented to  Procedure(s): RIGHT/LEFT HEART CATH AND CORONARY ANGIOGRAPHY (N/A) as a surgical intervention.  The patient's history has been reviewed, patient examined, no change in status, stable for surgery.  I have reviewed the patient's chart and labs.  Questions were answered to the patient's satisfaction.     Glenetta Hew

## 2020-02-16 NOTE — Progress Notes (Addendum)
Received patient from Echo alert and oriented , Skin warm and  Dry, denies any chest pain or any discomfort at this time.  Pt on monitor and IVF infusing.  Pt waiting for cath procedure.

## 2020-02-16 NOTE — Progress Notes (Signed)
Echocardiogram Echocardiogram Transesophageal has been performed.  Oneal Deputy Laiklynn Raczynski 02/16/2020, 9:43 AM

## 2020-02-16 NOTE — Research (Signed)
PHDEV Informed Consent   Subject Name: Cesar Bullock  Subject met inclusion and exclusion criteria.  The informed consent form, study requirements and expectations were reviewed with the subject and questions and concerns were addressed prior to the signing of the consent form.  The subject verbalized understanding of the trial requirements.  The subject agreed to participate in the [facility] trial and signed the informed consent at 0800 on 02/16/20.  The informed consent was obtained prior to performance of any protocol-specific procedures for the subject.  A copy of the signed informed consent was given to the subject and a copy was placed in the subject's medical record.   Eun Vermeer

## 2020-02-16 NOTE — Interval H&P Note (Signed)
History and Physical Interval Note:  02/16/2020 8:22 AM  Cesar Bullock  has presented today for surgery, with the diagnosis of SEVERE AROTIC STENOSIS PREOP.  The various methods of treatment have been discussed with the patient and family. After consideration of risks, benefits and other options for treatment, the patient has consented to  Procedure(s): TRANSESOPHAGEAL ECHOCARDIOGRAM (TEE) (N/A) as a surgical intervention.  The patient's history has been reviewed, patient examined, no change in status, stable for surgery.  I have reviewed the patient's chart and labs.  Questions were answered to the patient's satisfaction.     Elouise Munroe

## 2020-02-17 ENCOUNTER — Encounter (HOSPITAL_COMMUNITY): Payer: Self-pay | Admitting: Cardiology

## 2020-02-17 ENCOUNTER — Telehealth: Payer: Self-pay | Admitting: Cardiology

## 2020-02-17 NOTE — Telephone Encounter (Signed)
Cesar Bullock is calling requesting to speak with Cesar Bullock. He states there was splint put on his right arm yesterday after Dr. Ellyn Hack performed his procedure and he is wanting to know how long it needs to stay on. He states it is not bleeding. Please advise.

## 2020-02-18 ENCOUNTER — Other Ambulatory Visit: Payer: Self-pay

## 2020-02-18 ENCOUNTER — Encounter (HOSPITAL_COMMUNITY): Payer: Self-pay | Admitting: Internal Medicine

## 2020-02-18 DIAGNOSIS — I350 Nonrheumatic aortic (valve) stenosis: Secondary | ICD-10-CM

## 2020-02-18 NOTE — Telephone Encounter (Signed)
Spoke to patient .  Patient states he took off the  "splint " to the right arm this morning. No Drainage note or swelling per patient.   Patient states he will have his secretary  Clean the area off. RN inform the patient that will be fine.

## 2020-02-21 ENCOUNTER — Other Ambulatory Visit: Payer: Self-pay

## 2020-02-21 ENCOUNTER — Encounter: Payer: Self-pay | Admitting: Cardiovascular Disease

## 2020-02-21 ENCOUNTER — Ambulatory Visit (INDEPENDENT_AMBULATORY_CARE_PROVIDER_SITE_OTHER): Payer: Medicare Other | Admitting: Cardiovascular Disease

## 2020-02-21 VITALS — BP 128/80 | HR 97 | Ht 70.0 in | Wt 129.0 lb

## 2020-02-21 DIAGNOSIS — I350 Nonrheumatic aortic (valve) stenosis: Secondary | ICD-10-CM | POA: Diagnosis not present

## 2020-02-21 LAB — ECHO TEE
AR max vel: 0.35 cm2
AV Area VTI: 0.4 cm2
AV Area mean vel: 0.33 cm2
AV Mean grad: 53 mmHg
AV Peak grad: 78.9 mmHg
Ao pk vel: 4.44 m/s
MV M vel: 5.53 m/s
MV Peak grad: 122.1 mmHg
P 1/2 time: 479 msec
Radius: 0.57 cm

## 2020-02-21 NOTE — Progress Notes (Signed)
Structural Heart Clinic Consult Note  Chief Complaint  Patient presents with  . New Patient (Initial Visit)    severe aortic stenosis    History of Present Illness: 79 yo male with history of hearing loss, chronic systolic CHF, non-ischemic cardiomyopathy and severe aortic stenosis who is here today as a new consult in the structural heart clinic, referred by Dr. Ellyn Hack, for further discussion regarding his severe aortic stenosis. He had been seen in 2018 by Dr. Saunders Revel for moderate aortic stenosis but was lost to follow up. Recent worsening lower extremity edema, cough and dyspnea. Echo 02/08/20 with LVEF=25-30%, mild MR. The aortic valve is tricuspid with thickened, calcifiied leaflets and limited leaflet excursion. Mean gradient 41 mmHg, peak gradient 62. 84mHg,  AVA 0.49 cm2. Moderate aortic valve insufficiency. Cardiac cath 02/16/20 with no angiographic evidence of CAD.   He tells me today that his cough has resolved. His LE edema has resolved on Lasix. He continues to have dyspnea at rest and with exertion. No chest pain. 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 He is very active. He goes to the dentist and has no active dental issues.    Primary Care Physician: WMichael Boston MD Primary Cardiologist: HEllyn HackReferring Cardiologist: HEllyn Hack Past Medical History:  Diagnosis Date  . 2019 novel coronavirus disease (COVID-19) 08/12/2019   -Follow-up test on August 25, 2019 not detected.  Again not detected  both July 13 and 26, 2021  . Aortic stenosis 03/20/2017   Mild LVH.  EF~50%.  Severely calcified aortic valve-moderate AS (AoV velocity 4.4 m/s, AVA~0.6 cm; with mild AI.  MAC with mild MR.  Mild pulmonary hypertension but normal RV size and function.  . Disequilibrium   . Elevated blood pressure reading without diagnosis of hypertension 05/03/2008   Qualifier: Diagnosis of  By: PLarose KellsMD, JMonumentHyperglycemia 11/09/2012  .  HYPERLIPIDEMIA, MILD 04/02/2010   Qualifier: Diagnosis of  By: PLarose KellsMD, JAlda BertholdNuclear cataract 2018   Had surgery  . Sensorineural hearing loss, bilateral 2016   Strict hearing loss precautions    Past Surgical History:  Procedure Laterality Date  . BUBBLE STUDY  02/16/2020   Procedure: BUBBLE STUDY;  Surgeon: AElouise Munroe MD;  [facility];  Service: Cardiology;;  . Cataract surgery  2018   Dr. DCalvert Cantor . RIGHT/LEFT HEART CATH AND CORONARY ANGIOGRAPHY N/A 02/16/2020   Procedure: RIGHT/LEFT HEART CATH AND CORONARY ANGIOGRAPHY;  Surgeon: HLeonie Man MD;  [facility];  Service: Cardiovascular;  Laterality: N/A;  . TEE WITHOUT CARDIOVERSION N/A 02/16/2020   Procedure: TRANSESOPHAGEAL ECHOCARDIOGRAM (TEE);  Surgeon: AElouise Munroe MD;  [facility]  Service: Cardiology;  Laterality: N/A;  . TONSILLECTOMY     age 79 . TRANSTHORACIC ECHOCARDIOGRAM  03/2017   [facility]): Mild LVH with normal LV contraction (LVEF 50%). Severely calcified aortic valve with mild regurgitation and moderate-severe stenosis (AoV velocity 4.4 m/s, AVA 0.6 cm^2). Mitral calcification with mild MR. Mild pulmonary hypertension. Normal RV size and function.  . TRANSTHORACIC ECHOCARDIOGRAM  07/19/2015    Normal LV size with mild LVH. LVEF 55-60% with grade 2 diastolic dysfunction. Moderate AS and mild AI. MAC with mild MR. Mild LA enlargement. Normal RV size and function.  . TRANSTHORACIC ECHOCARDIOGRAM  02/11/2020   EF 25-30% with severely reduced function global).  Moderately elevated PA pressures (~  53 mmHg).  Severe AS with mild to moderate AI (AVA by VTI ~0.49 cm, mean gradient 41 mmHg. V max 3.70ms; AI py PHT 465 msec)    Current Outpatient Medications  Medication Sig Dispense Refill  . aspirin EC 81 MG tablet Take 81 mg by mouth daily. Swallow whole.    . carvedilol (COREG) 3.125 MG tablet Take 1 tablet (3.125 mg total) by mouth 2 (two)  times daily. 180 tablet 3  . furosemide (LASIX) 20 MG tablet Take 1 tablet (20 mg total) by mouth daily. (Patient taking differently: Take 20-40 mg by mouth daily. ) 90 tablet 3  . losartan (COZAAR) 25 MG tablet Take 1 tablet (25 mg total) by mouth daily. 90 tablet 3   Current Facility-Administered Medications  Medication Dose Route Frequency Provider Last Rate Last Admin  . sodium chloride flush (NS) 0.9 % injection 3 mL  3 mL Intravenous Q12H HLeonie Man MD        No Known Allergies  Social History   Socioeconomic History  . Marital status: Single    Spouse name: Not on file  . Number of children: 2  . Years of education: Not on file  . Highest education level: Not on file  Occupational History  . Occupation: mPsychologist, educational works full time    Comment: [employer] Tobacco Use  . Smoking status: Never Smoker  . Smokeless tobacco: Never Used  Vaping Use  . Vaping Use: Never used  Substance and Sexual Activity  . Alcohol use: Not Currently    Alcohol/week: 6.0 standard drinks    Types: 6 Shots of liquor per week    Comment: vodka when he stopped drinking roughly 3 months ago _0  none none none  . Drug use: No  . Sexual activity: Not on file  Other Topics Concern  . 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 Difficulty of Paying Living Expenses: Not on file  Food Insecurity:   . Worried About RCharity fundraiserin the Last Year: Not on file  . Ran Out of Food in the Last Year: Not on file  Transportation Needs:   . Lack of Transportation (Medical): Not on file  . Lack of Transportation (Non-Medical): Not on file  Physical Activity:   . Days of Exercise per Week: Not on file  . Minutes of Exercise per Session: Not on file  Stress:   . Feeling of  Stress : Not on file  Social Connections:   . Frequency of Communication with Friends and Family: Not on file  . Frequency of Social Gatherings with Friends and Family: Not on file  . Attends Religious Services: Not on file  . Active Member of Clubs or Organizations: Not on file  . Attends CArchivistMeetings: Not on file  . Marital Status: Not on file  Intimate Partner Violence:   . Fear of Current or Ex-Partner: Not on file  . Emotionally Abused: Not on file  . Physically Abused: Not on file  . Sexually Abused: Not on file    Family History  Problem Relation Age of Onset  . Diabetes Other        GM  . CAD  Brother 22       PCI at age 74;   . Stroke Mother 61       Had second stroke at age 44, died age 13  . Cancer Father 32       Reportedly sinus cancer  . Colon cancer Neg Hx   . Prostate cancer Neg Hx     Review of Systems:  As stated in the HPI and otherwise negative.   BP 128/80   Pulse 97   Ht _0  (1.778 m)   Wt 129 lb (58.5 kg)   SpO2 100%   BMI 18.51 kg/m   Physical Examination: General: Well developed, well nourished, NAD  HEENT: OP clear, mucus membranes moist  SKIN: warm, dry. No rashes. Neuro: No focal deficits  Musculoskeletal: Muscle strength 5/5 all ext  Psychiatric: Mood and affect normal  Neck: No JVD, no carotid bruits, no thyromegaly, no lymphadenopathy.  Lungs:Clear bilaterally, no wheezes, rhonci, crackles Cardiovascular: Regular rate and rhythm. Loud, harsh, late peaking systolic murmur.  Abdomen:Soft. Bowel sounds present. Non-tender.  Extremities: No lower extremity edema. Pulses are 2 + in the bilateral DP/PT.  EKG:  EKG is not ordered today. The ekg ordered 8/921 is reviewed by me today and shows sinus tach with first degree AV block, IVCD, non-specific T wave abn.   Echo 02/08/20: 1. Left ventricular ejection fraction, by estimation, is 25 to 30%. The  left ventricle has severely decreased function. The left ventricle   demonstrates global hypokinesis. Left ventricular diastolic function could  not be evaluated. The average left  ventricular global longitudinal strain is -5.7 %. The global longitudinal  strain is normal.  2. Right ventricular systolic function is normal. The right ventricular  size is normal. There is moderately elevated pulmonary artery systolic  pressure. The estimated right ventricular systolic pressure is 38.1 mmHg.  3. Left atrial size was mildly dilated.  4. The pericardial effusion is circumferential. Moderate pleural effusion  in the left lateral region.  5. The mitral valve is degenerative. Mild mitral valve regurgitation. No  evidence of mitral stenosis.  6. The aortic valve is tricuspid. Aortic valve regurgitation is mild to  moderate. Severe aortic valve stenosis. Aortic regurgitation PHT measures  465 msec. Aortic valve area, by VTI measures 0.49 cm. Aortic valve mean  gradient measures 41.0 mmHg.  Aortic valve Vmax measures 3.96 m/s.  7. Aortic dilatation noted. There is mild dilatation of the ascending  aorta measuring 39 mm.  8. The inferior vena cava is normal in size with greater than 50%  respiratory variability, suggesting right atrial pressure of 3 mmHg.   Comparison(s): 03/20/17 Moderate-severe AS 78mHg peak. Compared to echo  2017, LVF has significantly declined and aortic stenosis has progressed.   FINDINGS  Left Ventricle: Left ventricular ejection fraction, by estimation, is 25  to 30%. The left ventricle has severely decreased function. The left  ventricle demonstrates global hypokinesis. The average left ventricular  global longitudinal strain is -5.7 %.  The global longitudinal strain is normal. The left ventricular internal  cavity size was normal in size. There is no left ventricular hypertrophy.  Left ventricular diastolic function could not be evaluated.   Right Ventricle: The right ventricular size is normal. No increase in  right  ventricular wall thickness. Right ventricular systolic function is  normal. There is moderately elevated pulmonary artery systolic pressure.  The tricuspid regurgitant velocity is  3.53 m/s, and with an assumed right atrial pressure of 3 mmHg, the  estimated right ventricular systolic pressure is 59.1 mmHg.   Left Atrium: Left atrial size was mildly dilated.   Right Atrium: Right atrial size was normal in size.   Pericardium: A small pericardial effusion is present. The pericardial  effusion is circumferential.   Mitral Valve: The mitral valve is degenerative in appearance. There is  severe thickening of the mitral valve leaflet(s). There is mild  calcification of the mitral valve leaflet(s). Normal mobility of the  mitral valve leaflets. Severe mitral annular  calcification. Mild mitral valve regurgitation. No evidence of mitral  valve stenosis.   Tricuspid Valve: The tricuspid valve is normal in structure. Tricuspid  valve regurgitation is mild . No evidence of tricuspid stenosis.   Aortic Valve: The aortic valve is tricuspid. . There is severe thickening  and severe calcifcation of the aortic valve. Aortic valve regurgitation is  mild to moderate. Aortic regurgitation PHT measures 465 msec. Severe  aortic stenosis is present. There is  severe thickening of the aortic valve. There is severe calcifcation of  the aortic valve. Aortic valve mean gradient measures 41.0 mmHg. Aortic  valve peak gradient measures 62.6 mmHg. Aortic valve area, by VTI measures  0.49 cm.   Pulmonic Valve: The pulmonic valve was normal in structure. Pulmonic valve  regurgitation is mild. No evidence of pulmonic stenosis.   Aorta: Aortic dilatation noted. There is mild dilatation of the ascending  aorta measuring 39 mm.   Venous: The inferior vena cava is normal in size with greater than 50%  respiratory variability, suggesting right atrial pressure of 3 mmHg.   IAS/Shunts: No atrial level shunt  detected by color flow Doppler.   Additional Comments: There is a moderate pleural effusion in the left  lateral region.     LEFT VENTRICLE  PLAX 2D  LVIDd:     5.30 cm  LVIDs:     4.30 cm 2D Longitudinal Strain  LV PW:     1.10 cm 2D Strain GLS (A2C):  -6.5 %  LV IVS:    1.10 cm 2D Strain GLS (A3C):  -3.3 %  LVOT diam:   2.20 cm 2D Strain GLS (A4C):  -7.3 %  LV SV:     45    2D Strain GLS Avg:   -5.7 %  LV SV Index:  24  LVOT Area:   3.80 cm               3D Volume EF:             3D EF:    29 %             LV EDV:    150 ml             LV ESV:    106 ml             LV SV:    44 ml   RIGHT VENTRICLE  RVSP:      57.8 mmHg   LEFT ATRIUM       Index    RIGHT ATRIUM  LA diam:    4.50 cm 2.46 cm/m RA Pressure: 8.00 mmHg  LA Vol (A2C):  67.3 ml 36.73 ml/m  LA Vol (A4C):  71.5 ml 39.02 ml/m  LA Biplane Vol: 70.2 ml 38.31 ml/m  AORTIC VALVE  AV Area (Vmax):  0.50 cm  AV Area (Vmean):  0.46 cm  AV Area (VTI):   0.49 cm  AV Vmax:  395.50 cm/s  AV Vmean:     306.000 cm/s  AV VTI:      0.918 m  AV Peak Grad:   62.6 mmHg  AV Mean Grad:   41.0 mmHg  LVOT Vmax:     51.70 cm/s  LVOT Vmean:    37.150 cm/s  LVOT VTI:     0.118 m  LVOT/AV VTI ratio: 0.13  AI PHT:      465 msec    AORTA  Ao Root diam: 3.10 cm  Ao Asc diam: 3.70 cm   MITRAL VALVE         TRICUSPID VALVE                TR Peak grad:  49.8 mmHg                TR Vmax:    353.00 cm/s  MR Peak grad:  105.3 mmHg Estimated RAP: 8.00 mmHg  MR Mean grad:  63.0 mmHg  RVSP:      57.8 mmHg  MR Vmax:     513.00 cm/s  MR Vmean:    373.0 cm/s SHUNTS  MR PISA:     2.26 cm  Systemic VTI: 0.12 m  MR PISA Eff ROA: 18 mm   Systemic Diam: 2.20 cm  MR PISA  Radius: 0.60 cm  MV E velocity: 113.00 cm/s  MV A velocity: 57.80 cm/s  MV E/A ratio: 1.96   Recent Labs: 02/09/2020: BNP 2,618.4 02/11/2020: BUN 20; Creatinine, Ser 1.10; Platelets 228 02/16/2020: Hemoglobin 12.2; Potassium 3.4; Sodium 143    Wt Readings from Last 3 Encounters:  02/21/20 129 lb (58.5 kg)  02/16/20 138 lb 3.2 oz (62.7 kg)  02/09/20 138 lb 3.2 oz (62.7 kg)     Other studies Reviewed: Additional studies/ records that were reviewed today include: echo images, cath films, office notes Review of the above records demonstrates: severe AS   Assessment and Plan:   1. Severe Aortic Valve Stenosis: He has severe, stage D aortic valve stenosis. I have personally reviewed the echo images. The aortic valve is thickened, calcified with limited leaflet mobility. I think he would benefit from AVR. Given advanced age, he is not a good candidate for conventional AVR by surgical approach. I think he may be a good candidate for TAVR.   STS Risk Score: Risk of Mortality: 1.717% Renal Failure: 1.319% Permanent Stroke: 1.184% Prolonged Ventilation: 7.402% DSW Infection: 0.051% Reoperation: 6.053% Morbidity or Mortality: 13.396% Short Length of Stay: 30.711% Long Length of Stay: 6.897%   I have reviewed the natural history of aortic stenosis with the patient and their family members  who are present today. We have discussed the limitations of medical therapy and the poor prognosis associated with symptomatic aortic stenosis. We have reviewed potential treatment options, including palliative medical therapy, conventional surgical aortic valve replacement, and transcatheter aortic valve replacement. We discussed treatment options in the context of the patient's specific comorbid medical conditions.   He would like to proceed with planning for TAVR. Risks and benefits of the valve procedure are reviewed with the patient. We will arrange a gated cardiac CT, CTA of the  chest/abdomen and pelvis, carotid artery dopplers and PT assessment and he will then be referred to see one of the CT surgeons on our TAVR team.      Current medicines are reviewed at length with the patient today.  The patient does not have concerns regarding medicines.  The following changes have been made:  no change  Labs/ tests ordered today include:  No orders of the defined types were placed in this encounter.    Disposition:   FU with the valve team. He has an appt with Dr. Cyndia Bent next week.    Signed, Lauree Chandler, MD 02/21/2020 9:26 AM    [facility] [place], [place], [place]  [address] Phone: [phone]; Fax: [phone]

## 2020-02-21 NOTE — Patient Instructions (Addendum)
Medication Instructions:  No changes *If you need a refill on your cardiac medications before your next appointment, please call your pharmacy*   Lab Work: None today   Testing/Procedures: See letter provided today   Other Instructions

## 2020-02-23 ENCOUNTER — Other Ambulatory Visit: Payer: Self-pay

## 2020-02-23 ENCOUNTER — Encounter: Payer: Self-pay | Admitting: Physical Therapy

## 2020-02-23 ENCOUNTER — Ambulatory Visit: Payer: Medicare Other | Attending: Cardiovascular Disease | Admitting: Physical Therapy

## 2020-02-23 ENCOUNTER — Ambulatory Visit (HOSPITAL_COMMUNITY)
Admission: RE | Admit: 2020-02-23 | Discharge: 2020-02-23 | Disposition: A | Discharge status: Home / Self Care | Payer: Medicare Other | Source: Ambulatory Visit | Attending: Cardiovascular Disease | Admitting: Cardiovascular Disease

## 2020-02-23 DIAGNOSIS — I350 Nonrheumatic aortic (valve) stenosis: Secondary | ICD-10-CM

## 2020-02-23 DIAGNOSIS — R262 Difficulty in walking, not elsewhere classified: Secondary | ICD-10-CM | POA: Insufficient documentation

## 2020-02-23 DIAGNOSIS — R918 Other nonspecific abnormal finding of lung field: Secondary | ICD-10-CM | POA: Diagnosis not present

## 2020-02-23 DIAGNOSIS — K5730 Diverticulosis of large intestine without perforation or abscess without bleeding: Secondary | ICD-10-CM | POA: Diagnosis not present

## 2020-02-23 MED ORDER — IOHEXOL 350 MG/ML SOLN
100.0000 mL | Freq: Once | INTRAVENOUS | Status: AC | PRN
  Administered 2020-02-23: 100 mL via INTRAVENOUS

## 2020-02-23 NOTE — Therapy (Signed)
[place], [place], [address] Phone: [phone]   Fax:  [phone]  Physical Therapy Evaluation/TAVR  Patient Details  Name: Cesar Bullock MRN: [id number] Date of Birth: 13-Jul-1940 Referring Provider (PT): Lauree Chandler, MD   Encounter Date: 02/23/2020   PT End of Session - 02/23/20 1252    Visit Number 1    PT Start Time 1208    PT Stop Time 1236    PT Time Calculation (min) 28 min    Activity Tolerance Patient tolerated treatment well    Behavior During Therapy [facility] for tasks assessed/performed           Past Medical History:  Diagnosis Date  . 2019 novel coronavirus disease (COVID-19) 08/12/2019   -Follow-up test on August 25, 2019 not detected.  Again not detected  both July 13 and 26, 2021  . Aortic stenosis 03/20/2017   Mild LVH.  EF~50%.  Severely calcified aortic valve-moderate AS (AoV velocity 4.4 m/s, AVA~0.6 cm; with mild AI.  MAC with mild MR.  Mild pulmonary hypertension but normal RV size and function.  . Disequilibrium   . Elevated blood pressure reading without diagnosis of hypertension 05/03/2008   Qualifier: Diagnosis of  By: Larose Kells MD, [place] Hyperglycemia 11/09/2012  . HYPERLIPIDEMIA, MILD 04/02/2010   Qualifier: Diagnosis of  By: Larose Kells MD, Alda Berthold Nuclear cataract 2018   Had surgery  . Sensorineural hearing loss, bilateral 2016   Strict hearing loss precautions    Past Surgical History:  Procedure Laterality Date  . BUBBLE STUDY  02/16/2020   Procedure: BUBBLE STUDY;  Surgeon: Elouise Munroe, MD;  [facility];  Service: Cardiology;;  . Cataract surgery  2018   Dr. Calvert Cantor  . RIGHT/LEFT HEART CATH AND CORONARY ANGIOGRAPHY N/A 02/16/2020   Procedure: RIGHT/LEFT HEART CATH AND CORONARY ANGIOGRAPHY;  Surgeon: Leonie Man, MD;  Location: [place] CV LAB;  Service: Cardiovascular;  Laterality: N/A;  . TEE WITHOUT CARDIOVERSION N/A  02/16/2020   Procedure: TRANSESOPHAGEAL ECHOCARDIOGRAM (TEE);  Surgeon: Elouise Munroe, MD;  Location: [place];  Service: Cardiology;  Laterality: N/A;  . TONSILLECTOMY     age 26  . TRANSTHORACIC ECHOCARDIOGRAM  03/2017   [facility]): Mild LVH with normal LV contraction (LVEF 50%). Severely calcified aortic valve with mild regurgitation and moderate-severe stenosis (AoV velocity 4.4 m/s, AVA 0.6 cm^2). Mitral calcification with mild MR. Mild pulmonary hypertension. Normal RV size and function.  . TRANSTHORACIC ECHOCARDIOGRAM  07/19/2015    Normal LV size with mild LVH. LVEF 55-60% with grade 2 diastolic dysfunction. Moderate AS and mild AI. MAC with mild MR. Mild LA enlargement. Normal RV size and function.  . TRANSTHORACIC ECHOCARDIOGRAM  02/11/2020   EF 25-30% with severely reduced function global).  Moderately elevated PA pressures (~53 mmHg).  Severe AS with mild to moderate AI (AVA by VTI ~0.49 cm, mean gradient 41 mmHg. V max 3.72ms; AI py PHT 465 msec)    There were no vitals filed for this visit.    Subjective Assessment - 02/23/20 1213    Subjective SOB began about 2 months ago. SOb pretty much all the time. Sometimes I cannot get a deep breath. Walking on a regular basis- about less than daily. I am active. One other thing I noticed was significant swelling in my feet that seems to have gone away on Sunday.    How long can you walk  comfortably? about 5 min    Currently in Pain? No/denies              [facility] PT Assessment - 02/23/20 0001      Assessment   Medical Diagnosis severe aortic stenosis    Referring Provider (PT) Lauree Chandler, MD    Onset Date/Surgical Date --   about 2 mo ago   Hand Dominance Right      Precautions   Precautions None      Restrictions   Weight Bearing Restrictions No      Balance Screen   Has the patient fallen in the past 6 months No      [place] residence    Living  Arrangements --   dog   Additional Comments step between levels in home, stairs are rarely used      Prior Function   Level of Independence Independent    Vocation Full time employment    Teacher, music      Cognition   Overall Cognitive Status Within Functional Limits for tasks assessed      Observation/Other Assessments   Focus on Therapeutic Outcomes (FOTO)  n/a TAVR      Sensation   Additional Comments WFL      Posture/Postural Control   Posture Comments rounded shoulders, forward head      ROM / Strength   AROM / PROM / Strength AROM;Strength      AROM   Overall AROM Comments WFL      Strength   Overall Strength Comments gross 5/5    Strength Assessment Site Hand    Right/Left hand Right;Left    Right Hand Grip (lbs) 70    Left Hand Grip (lbs) 65            [facility] Pre-Surgical Assessment - 02/23/20 0001    5 Meter Walk Test- trial 1 3 sec    5 Meter Walk Test- trial 2 3 sec.     5 Meter Walk Test- trial 3 3 sec.    5 meter walk test average 3 sec    4 Stage Balance Test Position 2    Comment unable,performed 2 sit to stands    6 Minute Walk- Baseline yes    BP (mmHg) 117/63    HR (bpm) 70    02 Sat (%RA) 99 %    Modified Borg Scale for Dyspnea 3- Moderate shortness of breath or breathing difficulty    Perceived Rate of Exertion (Borg) 6-    6 Minute Walk Post Test yes    BP (mmHg) 125/69    HR (bpm) 83    02 Sat (%RA) 97 %    Modified Borg Scale for Dyspnea 4- somewhat severe    Perceived Rate of Exertion (Borg) 13- Somewhat hard    Aerobic Endurance Distance Walked 555    Endurance additional comments 68% limitation compared to age-related normative values                    Objective measurements completed on examination: See above findings.                            Plan - 02/23/20 1255    PT Frequency One time visit    Consulted and Agree with Plan of Care Patient            Clinical Impression Statement: Pt  is a 79 yo M presenting to OP PT for evaluation prior to possible TAVR surgery due to severe aortic stenosis. Pt reports onset of SOB about 2 months ago. Symptoms are  limiting functional endurance. Pt presents with WFL ROM and strength and denies musculoskeletal pain.  Pt ambulated 285 feet in 1.5 min before requesting a standingrest beak lasting 1 min. At time of rest, patient's HR was 84 bpm and O2 was 96 on room air. Pt reported 4/10 shortness of breath on modified scale for dyspnea. Pt able to resume after rest and ambulate the rest of his walk. He chose to end his walk in a seated rest break with 2 minutes left on the timer. Pt ambulated a total of 555 feet in 6 minute walk and reported 4/10 SOB on modified scale for dyspena and 14/20 RPE on Borg's perceived exertion and pain scale at the end of the walk. During the 6 minute walk test, patient's HR increased to 86 BPM and O2 saturation decreased to 94%. Based on the Short Physical Performance Battery, patient has a frailty rating of 6/12 with </= 5/12 considered frail. \ Visit Diagnosis: Difficulty in walking, not elsewhere classified     Problem List Patient Active Problem List   Diagnosis Date Noted  . Dilated cardiomyopathy ([place]) 02/12/2020  . Acute combined systolic and diastolic heart failure ([place]) 02/08/2020  . Bilateral lower extremity edema 02/07/2020  . DOE (dyspnea on exertion) 02/07/2020  . Abnormal EKG 03/24/2017  . Elevated brain natriuretic peptide (BNP) level 03/24/2017  . Elevated blood pressure reading 03/24/2017  . Symptomatic severe aortic stenosis with normal ejection fraction 02/12/2016  . PCP NOTES >>>>> 06/12/2015  . Disequilibrium 05/02/2015  . Dermatochalasis of both eyelids 12/14/2014  . Dizziness and giddiness 12/02/2014  . Annual physical exam 11/09/2012  . Hyperglycemia 11/09/2012  . Nuclear cataract 03/13/2012  . Hyperlipidemia with target LDL less than 100  04/02/2010  . ELEVATED BLOOD PRESSURE WITHOUT DIAGNOSIS OF HYPERTENSION 05/03/2008    Veneda Kirksey C. Fenris Cauble PT, DPT 02/23/20 12:58 PM   [facility] [address] [place], [place], [address] Phone: [phone]   Fax:  [phone]  Name: KIARA KEEP MRN: [id number] Date of Birth: 01/08/1941

## 2020-02-25 ENCOUNTER — Ambulatory Visit: Payer: Medicare Other | Admitting: Cardiology

## 2020-03-01 ENCOUNTER — Encounter: Payer: Self-pay | Admitting: Surgery

## 2020-03-01 ENCOUNTER — Institutional Professional Consult (permissible substitution) (INDEPENDENT_AMBULATORY_CARE_PROVIDER_SITE_OTHER): Payer: Medicare Other | Admitting: Surgery

## 2020-03-01 ENCOUNTER — Other Ambulatory Visit: Payer: Self-pay

## 2020-03-01 VITALS — BP 125/85 | HR 90 | Temp 97.6°F | Resp 20 | Ht 70.0 in | Wt 130.0 lb

## 2020-03-01 DIAGNOSIS — I350 Nonrheumatic aortic (valve) stenosis: Secondary | ICD-10-CM | POA: Diagnosis not present

## 2020-03-01 HISTORY — PX: OTHER SURGICAL HISTORY: SHX169

## 2020-03-01 NOTE — Progress Notes (Signed)
HEART AND [place] [facility]GERY CONSULTATION REPORT  Referring Provider is Cesar Man, MD Primary Cardiologist is Cesar Hew, MD PCP is Cesar Lefevre Jesse Sans, MD  Chief Complaint  Patient presents with  . Aortic Stenosis    Surgical consult for TAVR review all testing    [name] with a history of hyperlipidemia and hypertension who is followed by Dr. Larose Bullock in the past but he had not been back to see him for least 3 years.  He now presents with at least a 14-monthhistory of progressive exertional shortness of breath and fatigue.  He also developed lower extremity edema.  He denies orthopnea and PND.  Has had no dizziness or syncope.  He reports having an episode last week where he had left-sided chest discomfort all day long which subsequently resolved in the evening.  He was seen by Dr. HEllyn Hackand underwent an echocardiogram showing a trileaflet aortic valve with mild to moderate aortic insufficiency with a pressure half-time of 465 ms and severe aortic stenosis with a mean gradient of 41 mmHg.  There is mild mitral regurgitation.  Left ventricular ejection fraction was severely reduced to 25 to 30% with an LV internal dimension of 5.3 cm during diastole and 4.3 cm during systole.  [facility] severe aortic stenosis with a calcified aortic valve and a peak velocity of 4.4 m/s.  The peak gradient was 78.2 mmHg with no mean gradient recorded.  Aortic valve area was 0.56 cm.  Left ventricular ejection fraction at that time was 50% with mild LVH.  There is mild MR.  He reports being asymptomatic at that time but did have an elevated BNP.  He saw Dr. CGerald Bullock on 03/24/2017 and plans are made for stress testing which apparently was not performed.  He did not return for follow-up and when he began having symptoms a couple months ago he tried to get  into see Dr. PLarose Kellsbut could not get an appointment since he was a new patient and Dr. PLarose Kellswas not taking new patients.   He is here today by himself.  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  He has 2 children and 6 grandchildren.  He is a non-smoker and quit drinking alcohol and 08/2019.  He was drinking 1 drink per day prior to that.  He says that his shortness of breath is now occurring with any physical activity and sometimes at rest he feels short of breath.  He has to take frequent rest breaks with ambulation.  He feels like his appetite has been normal although his weight was in the 140s a year ago and now 120s.  Past Medical History:  Diagnosis Date  . 2019 novel coronavirus disease (COVID-19) 08/12/2019   -Follow-up test on August 25, 2019 not detected.  Again not detected  both July 13 and 26, 2021  . Aortic stenosis 03/20/2017   Mild LVH.  EF~50%.  Severely calcified aortic valve-moderate AS (AoV velocity 4.4 m/s, AVA~0.6 cm; with mild AI.  MAC with mild MR.  Mild pulmonary hypertension but normal RV size and function.  . Disequilibrium   . Elevated blood pressure reading without diagnosis of hypertension 05/03/2008   Qualifier: Diagnosis of  By: Cesar KellsMD, Cesar Bullock 11/09/2012  . HYPERLIPIDEMIA, MILD  04/02/2010   Qualifier: Diagnosis of  By: Cesar Kells MD, Cesar Bullock Nuclear cataract 2018   Had surgery  . Sensorineural hearing loss, bilateral 2016   Strict hearing loss precautions    Past Surgical History:  Procedure Laterality Date  . BUBBLE STUDY  02/16/2020   Procedure: BUBBLE STUDY;  Surgeon: Cesar Munroe, MD;  Location: [facility] ENDOSCOPY;  Service: Cardiology;;  . Cataract surgery  2018   Dr. Calvert Bullock  . RIGHT/LEFT HEART CATH AND CORONARY ANGIOGRAPHY N/A 02/16/2020   Procedure: RIGHT/LEFT HEART CATH AND CORONARY ANGIOGRAPHY;  Surgeon: Cesar Man, MD;  Location: [place] CV LAB;  Service:  Cardiovascular;  Laterality: N/A;  . TEE WITHOUT CARDIOVERSION N/A 02/16/2020   Procedure: TRANSESOPHAGEAL ECHOCARDIOGRAM (TEE);  Surgeon: Cesar Munroe, MD;  Location: [place];  Service: Cardiology;  Laterality: N/A;  . TONSILLECTOMY     age 89  . TRANSTHORACIC ECHOCARDIOGRAM  03/2017   [facility]): Mild LVH with normal LV contraction (LVEF 50%). Severely calcified aortic valve with mild regurgitation and moderate-severe stenosis (AoV velocity 4.4 m/s, AVA 0.6 cm^2). Mitral calcification with mild MR. Mild pulmonary hypertension. Normal RV size and function.  . TRANSTHORACIC ECHOCARDIOGRAM  07/19/2015    Normal LV size with mild LVH. LVEF 55-60% with grade 2 diastolic dysfunction. Moderate AS and mild AI. MAC with mild MR. Mild LA enlargement. Normal RV size and function.  . TRANSTHORACIC ECHOCARDIOGRAM  02/11/2020   EF 25-30% with severely reduced function global).  Moderately elevated PA pressures (~53 mmHg).  Severe AS with mild to moderate AI (AVA by VTI ~0.49 cm, mean gradient 41 mmHg. V max 3.59ms; AI py PHT 465 msec)    Family History  Problem Relation Age of Onset  . Diabetes Other        GM  . CAD Brother 520      PCI at age 77   . Stroke Mother 74      Had second stroke at age 77 died age 77 . Cancer Father 335      Reportedly sinus cancer  . Colon cancer Neg Hx   . Prostate cancer Neg Hx     Social History   Socioeconomic History  . Marital status: Single    Spouse name: Not on file  . Number of children: 2  . Years of education: Not on file  . Highest education level: Not on file  Occupational History  . Occupation: mPsychologist, educational works full time    [place]  . Smoking status: Never Smoker  . Smokeless tobacco: Never Used  Vaping Use  . Vaping Use: Never used  Substance and Sexual Activity  . Alcohol use: Not Currently    Alcohol/week: 6.0 standard drinks    Types: 6 Shots of liquor per week     Comment: vodka when he stopped drinking roughly 3 months ago _0  none none none  . Drug use: No  . Sexual activity: Not on file  Other Topics Concern  . 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 Difficulty of Paying Living Expenses: Not on file  Food Insecurity:   . Worried  About Running Out of Food in the Last Year: Not on file  . Ran Out of Food in the Last Year: Not on file  Transportation Needs:   . Lack of Transportation (Medical): Not on file  . Lack of Transportation (Non-Medical): Not on file  Physical Activity:   . Days of Exercise per Week: Not on file  . Minutes of Exercise per Session: Not on file  Stress:   . Feeling of Stress : Not on file  Social Connections:   . Frequency of Communication with Friends and Family: Not on file  . Frequency of Social Gatherings with Friends and Family: Not on file  . Attends Religious Services: Not on file  . Active Member of Clubs or Organizations: Not on file  . Attends Archivist Meetings: Not on file  . Marital Status: Not on file  Intimate Partner Violence:   . Fear of Current or Ex-Partner: Not on file  . Emotionally Abused: Not on file  . Physically Abused: Not on file  . Sexually Abused: Not on file    Current Outpatient Medications  Medication Sig Dispense Refill  . aspirin EC 81 MG tablet Take 81 mg by mouth daily. Swallow whole.    . carvedilol (COREG) 3.125 MG tablet Take 1 tablet (3.125 mg total) by mouth 2 (two) times daily. 180 tablet 3  . furosemide (LASIX) 20 MG tablet Take 1 tablet (20 mg total) by mouth daily. (Patient taking differently: Take 20-40 mg by mouth daily. ) 90 tablet 3  . losartan (COZAAR) 25 MG tablet Take 1 tablet (25 mg total) by mouth daily. 90 tablet 3   Current  Facility-Administered Medications  Medication Dose Route Frequency Provider Last Rate Last Admin  . sodium chloride flush (NS) 0.9 % injection 3 mL  3 mL Intravenous Q12H Cesar Man, MD        No Known Allergies    Review of Systems:   General:  normal appetite, + decreased  energy, no weight gain, + weight loss, no fever  Cardiac:  no chest pain with exertion, no chest pain at rest, +SOB with any exertion, + resting SOB, no PND, no orthopnea, no palpitations, no arrhythmia, no atrial fibrillation, + LE edema, no dizzy spells, no syncope  Respiratory:  + shortness of breath, no home oxygen, no productive cough, no dry cough, no bronchitis, no wheezing, no hemoptysis, no asthma, no pain with inspiration or cough, no sleep apnea, no CPAP at night  GI:   no difficulty swallowing, no reflux, no frequent heartburn, no hiatal hernia, no abdominal pain, no constipation, no diarrhea, no hematochezia, no hematemesis, no melena  GU:   no dysuria,  no frequency, no urinary tract infection, no hematuria, no enlarged prostate, no kidney stones, no kidney disease  Vascular:  no pain suggestive of claudication, no pain in feet, no leg cramps, no varicose veins, no DVT, no non-healing foot ulcer  Neuro:   no stroke, no TIA's, no seizures, no headaches, no temporary blindness one eye,  no slurred speech, no peripheral neuropathy, no chronic pain, no instability of gait, no memory/cognitive dysfunction  Musculoskeletal: no arthritis, no joint swelling, no myalgias, no difficulty walking, normal mobility   Skin:   no rash, no itching, no skin infections, no pressure sores or ulcerations  Psych:   no anxiety, no depression, no nervousness, no unusual recent stress  Eyes:   no blurry vision, no floaters, no recent vision changes, does not wear glasses  or contacts  ENT:   + hearing loss, no loose or painful teeth, no dentures, last saw dentist every 6 months  Hematologic:  no easy bruising, no abnormal  bleeding, no clotting disorder, no frequent epistaxis  Endocrine:  no diabetes, does not check CBG's at home           Physical Exam:   BP 125/85   Pulse 90   Temp 97.6 F (36.4 C) (Skin)   Resp 20   Ht _0  (1.778 m)   Wt 130 lb (59 kg)   SpO2 97% Comment: RA  BMI 18.65 kg/m   General:  Thin,  well-appearing  HEENT:  Unremarkable, NCAT, PERLA, EOMI  Neck:   no JVD, no bruits, no adenopathy   Chest:   clear to auscultation, decreased breath sounds in both bases, no wheezes, no rhonchi   CV:   RRR, grade lll/VI crescendo/decrescendo murmur heard best at RSB,  no diastolic murmur  Abdomen:  soft, non-tender, no masses   Extremities:  warm, well-perfused, pulses palpable at ankle, mild LE edema  Rectal/GU  Deferred  Neuro:   Grossly non-focal and symmetrical throughout  Skin:   Clean and dry, no rashes, no breakdown   Diagnostic Tests:  ECHOCARDIOGRAM REPORT       Patient Name:  Cesar Bullock Date of Exam: 02/08/2020  Medical Rec #: [id number]    Height:    70.0 in  Accession #:  [id number]   Weight:    147.2 lb  Date of Birth: 07-29-1940    BSA:     1.832 m  Patient Age:  40 years    BP:      157/99 mmHg  Patient Gender: M        HR:      97 bpm.  Exam Location: [address]   Procedure: 2D Echo, 3D Echo, Cardiac Doppler, Color Doppler and Strain  Analysis   Indications:  I35 Aortic stenosis    History:    Patient has prior history of Echocardiogram examinations,  most         recent 03/20/2017. Aortic Valve Disease,  Signs/Symptoms:Shortness         of Breath and Murmur; Risk Factors:Dyslipidemia. LE edema.    Sonographer:  Jessee Avers, RDCS  Referring Phys: [place]    1. Left ventricular ejection fraction, by estimation, is 25 to 30%. The  left ventricle has severely decreased function. The left ventricle  demonstrates global hypokinesis. Left  ventricular diastolic function could  not be evaluated. The average left  ventricular global longitudinal strain is -5.7 %. The global longitudinal  strain is normal.  2. Right ventricular systolic function is normal. The right ventricular  size is normal. There is moderately elevated pulmonary artery systolic  pressure. The estimated right ventricular systolic pressure is 26.9 mmHg.  3. Left atrial size was mildly dilated.  4. The pericardial effusion is circumferential. Moderate pleural effusion  in the left lateral region.  5. The mitral valve is degenerative. Mild mitral valve regurgitation. No  evidence of mitral stenosis.  6. The aortic valve is tricuspid. Aortic valve regurgitation is mild to  moderate. Severe aortic valve stenosis. Aortic regurgitation PHT measures  465 msec. Aortic valve area, by VTI measures 0.49 cm. Aortic valve mean  gradient measures 41.0 mmHg.  Aortic valve Vmax measures 3.96 m/s.  7. Aortic dilatation noted. There is mild dilatation of the ascending  aorta measuring 39 mm.  8. The inferior vena cava is normal in size with greater than 50%  respiratory variability, suggesting right atrial pressure of 3 mmHg.   Comparison(s): 03/20/17 Moderate-severe AS 35mHg peak. Compared to echo  2017, LVF has significantly declined and aortic stenosis has progressed.   FINDINGS  Left Ventricle: Left ventricular ejection fraction, by estimation, is 25  to 30%. The left ventricle has severely decreased function. The left  ventricle demonstrates global hypokinesis. The average left ventricular  global longitudinal strain is -5.7 %.  The global longitudinal strain is normal. The left ventricular internal  cavity size was normal in size. There is no left ventricular hypertrophy.  Left ventricular diastolic function could not be evaluated.   Right Ventricle: The right ventricular size is normal. No increase in  right ventricular wall thickness. Right ventricular  systolic function is  normal. There is moderately elevated pulmonary artery systolic pressure.  The tricuspid regurgitant velocity is  3.53 m/s, and with an assumed right atrial pressure of 3 mmHg, the  estimated right ventricular systolic pressure is 502.5mmHg.   Left Atrium: Left atrial size was mildly dilated.   Right Atrium: Right atrial size was normal in size.   Pericardium: A small pericardial effusion is present. The pericardial  effusion is circumferential.   Mitral Valve: The mitral valve is degenerative in appearance. There is  severe thickening of the mitral valve leaflet(s). There is mild  calcification of the mitral valve leaflet(s). Normal mobility of the  mitral valve leaflets. Severe mitral annular  calcification. Mild mitral valve regurgitation. No evidence of mitral  valve stenosis.   Tricuspid Valve: The tricuspid valve is normal in structure. Tricuspid  valve regurgitation is mild . No evidence of tricuspid stenosis.   Aortic Valve: The aortic valve is tricuspid. . There is severe thickening  and severe calcifcation of the aortic valve. Aortic valve regurgitation is  mild to moderate. Aortic regurgitation PHT measures 465 msec. Severe  aortic stenosis is present. There is  severe thickening of the aortic valve. There is severe calcifcation of  the aortic valve. Aortic valve mean gradient measures 41.0 mmHg. Aortic  valve peak gradient measures 62.6 mmHg. Aortic valve area, by VTI measures  0.49 cm.   Pulmonic Valve: The pulmonic valve was normal in structure. Pulmonic valve  regurgitation is mild. No evidence of pulmonic stenosis.   Aorta: Aortic dilatation noted. There is mild dilatation of the ascending  aorta measuring 39 mm.   Venous: The inferior vena cava is normal in size with greater than 50%  respiratory variability, suggesting right atrial pressure of 3 mmHg.   IAS/Shunts: No atrial level shunt detected by color flow Doppler.   Additional  Comments: There is a moderate pleural effusion in the left  lateral region.     LEFT VENTRICLE  PLAX 2D  LVIDd:     5.30 cm  LVIDs:     4.30 cm 2D Longitudinal Strain  LV PW:     1.10 cm 2D Strain GLS (A2C):  -6.5 %  LV IVS:    1.10 cm 2D Strain GLS (A3C):  -3.3 %  LVOT diam:   2.20 cm 2D Strain GLS (A4C):  -7.3 %  LV SV:     45    2D Strain GLS Avg:   -5.7 %  LV SV Index:  24  LVOT Area:   3.80 cm               3D Volume EF:  3D EF:    29 %             LV EDV:    150 ml             LV ESV:    106 ml             LV SV:    44 ml   RIGHT VENTRICLE  RVSP:      57.8 mmHg   LEFT ATRIUM       Index    RIGHT ATRIUM  LA diam:    4.50 cm 2.46 cm/m RA Pressure: 8.00 mmHg  LA Vol (A2C):  67.3 ml 36.73 ml/m  LA Vol (A4C):  71.5 ml 39.02 ml/m  LA Biplane Vol: 70.2 ml 38.31 ml/m  AORTIC VALVE  AV Area (Vmax):  0.50 cm  AV Area (Vmean):  0.46 cm  AV Area (VTI):   0.49 cm  AV Vmax:      395.50 cm/s  AV Vmean:     306.000 cm/s  AV VTI:      0.918 m  AV Peak Grad:   62.6 mmHg  AV Mean Grad:   41.0 mmHg  LVOT Vmax:     51.70 cm/s  LVOT Vmean:    37.150 cm/s  LVOT VTI:     0.118 m  LVOT/AV VTI ratio: 0.13  AI PHT:      465 msec    AORTA  Ao Root diam: 3.10 cm  Ao Asc diam: 3.70 cm   MITRAL VALVE         TRICUSPID VALVE                TR Peak grad:  49.8 mmHg                TR Vmax:    353.00 cm/s  MR Peak grad:  105.3 mmHg Estimated RAP: 8.00 mmHg  MR Mean grad:  63.0 mmHg  RVSP:      57.8 mmHg  MR Vmax:     513.00 cm/s  MR Vmean:    373.0 cm/s SHUNTS  MR PISA:     2.26 cm  Systemic VTI: 0.12 m  MR PISA Eff ROA: 18 mm   Systemic Diam: 2.20 cm  MR PISA Radius: 0.60 cm  MV E velocity: 113.00 cm/s  MV  A velocity: 57.80 cm/s  MV E/A ratio: 1.96   Fransico Him MD  Electronically signed by Fransico Him MD  Signature Date/Time: 02/08/2020/11:23:03 AM    TRANSESOPHOGEAL ECHO REPORT       Patient Name:  Cesar Bullock Date of Exam: 02/16/2020  Medical Rec #: [id number]    Height:    70.0 in  Accession #:  [id number]   Weight:    138.2 lb  Date of Birth: 1940-09-05    BSA:     1.784 m  Patient Age:  71 years    BP:      146/91 mmHg  Patient Gender: M        HR:      68 bpm.  Exam Location: Inpatient   Procedure: Transesophageal Echo, 3D Echo, Color Doppler, Cardiac Doppler  and       Saline Contrast Bubble Study   Indications:   Aortic Stenosis i35.0    History:     Patient has prior history of Echocardiogram examinations,  most          recent 02/08/2020. Risk Factors:Dyslipidemia and COVID  02/21.  Sonographer:   Raquel Sarna Senior RDCS  Referring Phys: [id number] Cesar Bullock  Diagnosing Phys: Cherlynn Kaiser MD   PROCEDURE: After discussion of the risks and benefits of a TEE, an  informed consent was obtained from the patient. TEE procedure time was 39  minutes. The transesophogeal probe was passed without difficulty through  the esophogus of the patient. Imaged  were obtained with the patient in a left lateral decubitus position. Local  oropharyngeal anesthetic was provided with Cetacaine. Sedation performed  by different physician. The patient was monitored while under deep  sedation. Anesthestetic sedation was  provided intravenously by Anesthesiology: 159m of Propofol. Image quality  was excellent. The patient's vital signs; including heart rate, blood  pressure, and oxygen saturation; remained stable throughout the procedure.  The patient developed no  complications during the procedure.   IMPRESSIONS    1. The aortic valve is abnormal. Aortic valve regurgitation is mild to  moderate.  Severe aortic valve stenosis. Aortic valve area, by VTI measures  0.40 cm. Aortic valve mean gradient measures 53.0 mmHg. Aortic valve Vmax  measures 4.44 m/s.  2. Left ventricular ejection fraction, by estimation, is 20 to 25%. The  left ventricle has severely decreased function. The left ventricle  demonstrates global hypokinesis. The left ventricular internal cavity size  was mildly dilated. There is mild left  ventricular hypertrophy.  3. Right ventricular systolic function mild-moderately reduced. The right  ventricular size is mildly enlarged.  4. Left atrial size was mildly dilated. No left atrial/left atrial  appendage thrombus was detected.  5. Biatrial size was mildly dilated.  6. The mitral valve is degenerative. Moderate mitral valve regurgitation.  No evidence of mitral stenosis.  7. There is Moderate (Grade III) atheroma plaque involving the transverse  and descending aorta.  8. Large pleural effusion.   FINDINGS  Left Ventricle: Left ventricular ejection fraction, by estimation, is 20  to 25%. The left ventricle has severely decreased function. The left  ventricle demonstrates global hypokinesis. The left ventricular internal  cavity size was mildly dilated. There  is mild left ventricular hypertrophy.   Right Ventricle: The right ventricular size is mildly enlarged. No  increase in right ventricular wall thickness. Right ventricular systolic  function mild-moderately reduced.   Left Atrium: Left atrial size was mildly dilated. No left atrial/left  atrial appendage thrombus was detected.   Right Atrium: Right atrial size was mildly dilated.   Pericardium: A small pericardial effusion is present.   Mitral Valve: The mitral valve is degenerative in appearance. Moderate  mitral valve regurgitation. No evidence of mitral valve stenosis.   Tricuspid Valve: The tricuspid valve is normal in structure. Tricuspid  valve regurgitation is mild.   Aortic Valve:  Unable to obtain accurate vena contracta due to shadowing  from calcium. The aortic valve is abnormal. Aortic valve regurgitation is  mild to moderate. Aortic regurgitation PHT measures 479 msec. Severe  aortic stenosis is present. Aortic  valve mean gradient measures 53.0 mmHg. Aortic valve peak gradient  measures 78.9 mmHg. Aortic valve area, by VTI measures 0.40 cm.   Pulmonic Valve: The pulmonic valve was normal in structure. Pulmonic valve  regurgitation is mild.   Aorta: The aortic root and ascending aorta are structurally normal, with  no evidence of dilitation. There is moderate (Grade III) atheroma plaque  involving the transverse and descending aorta.   IAS/Shunts: No atrial level shunt detected by color flow Doppler. Agitated  saline contrast was given intravenously to evaluate for  intracardiac  shunting.   Additional Comments: There is a large pleural effusion.     LEFT VENTRICLE  PLAX 2D  LVOT diam:   2.20 cm  LV SV:     47  LV SV Index:  26  LVOT Area:   3.80 cm     AORTIC VALVE  AV Area (Vmax):  0.35 cm  AV Area (Vmean):  0.33 cm  AV Area (VTI):   0.40 cm  AV Vmax:      444.00 cm/s  AV Vmean:     346.000 cm/s  AV VTI:      1.160 m  AV Peak Grad:   78.9 mmHg  AV Mean Grad:   53.0 mmHg  LVOT Vmax:     41.00 cm/s  LVOT Vmean:    29.600 cm/s  LVOT VTI:     0.123 m  LVOT/AV VTI ratio: 0.11  AI PHT:      479 msec   MR Peak grad:  122.1 mmHg  MR Mean grad:  75.0 mmHg  SHUNTS  MR Vmax:     552.50 cm/s Systemic VTI: 0.12 m  MR Vmean:    403.5 cm/s Systemic Diam: 2.20 cm  MR PISA Nyquist: 3.8 m/s  MR PISA:     2.04 cm  MR PISA Eff ROA: 14 mm  MR PISA Radius: 0.57 cm   Cherlynn Kaiser MD  Electronically signed by Cherlynn Kaiser MD  Signature Date/Time: 02/21/2020/3:09:05 PM    Physicians  Panel Physicians Referring Physician Case Authorizing Physician  Cesar Man,  MD (Primary)    Procedures  RIGHT/LEFT HEART CATH AND CORONARY ANGIOGRAPHY  Conclusion    Hemodynamic findings consistent with moderate pulmonary hypertension.  There is severe aortic valve stenosis.  There is severe left ventricular systolic dysfunction.  The left ventricular ejection fraction is 25-35% by visual estimate.     Severe Aortic Stenosis  Severe Valvular Cardiomyopathy - EF ~25% & CO/CI: 3.25/1.82  Moderate to Severe Pulmonary Hypertension  Angiographically Normal Coronary Arteries    Recommendations: The patient be discharged home after bedrest.  He will be scheduled to see Dr. Angelena Form or either Dr. Minna Merritts. Cyndia Bent in the TAVR clinic.    Cesar Hew, MD   Recommendations  Antiplatelet/Anticoag Recommend Aspirin 51m daily for moderate CAD.  Discharge Date In the absence of any other complications or medical issues, we expect the patient to be ready for discharge from a cath perspective on 02/16/2020. He will be scheduled for TAVR clinic consultation. Dr. MAngelena Formsaw the patient in the short stay to discuss the procedure.  Surgeon Notes    02/16/2020 9:59 AM CV Procedure signed by AElouise Munroe MD  Indications  Severe aortic stenosis [I35.0 (ICD-10-CM)]  Acute combined systolic and diastolic heart failure ([place] [I50.41 (ICD-10-CM)]  Valvular cardiomyopathy ([place] [[M76.7(ICD-10-CM)]  Procedural Details  Technical Details Primary Care Provider: WMichael BostonMD Cardiologist: DGlenetta Hew MD  EChevis PrettyCumins is a 79y.o. male who was seen in consultation initially for heart failure symptoms, he underwent echocardiogram showing severe aortic stenosis and severe cardiomyopathy.  He was then seen for the close follow-up of now SEVERE (symptomatic) AORTIC STENOSIS, SHORTNESS OF BREATH, LEG SWELLING.  He was started on low-dose Lasix and carvedilol with notable improvement in symptoms.  He is lost over 15 pounds of diuresis.   Echo  02/08/2020: EF 25-30% with severely reduced function global).  Moderately elevated PA pressures (~53 mmHg).  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 V max 3.24ms; AI py PHT 465 msec).  Mild MR.  Mildly dilated left atrium.  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  Small circumferential pericardial effusion.   Based on results of the echocardiogram, he is now referred for right left heart catheterization.  Time Out: Verified patient identification, verified procedure, site/side was marked, verified correct patient position, special equipment/implants available, medications/allergies/relevent history reviewed, required imaging and test results available. Performed.  Access:  Right radial Artery: 6 Fr sheath -- Seldinger technique using Micropuncture Kit Direct ultrasound guidance used.  Permanent image obtained and placed on chart. 10 mL radial cocktail IA; 4000 units IV Heparin * Right Brachial/Antecubital Vein: The existing 18-gauge IV was exchanged over a wire for a 5Fr  sheath  Right Heart Catheterization: 5 Fr SGordy Councilmancatheter advanced under fluoroscopy with balloon inflated to the RA, RV, then PCWP-PA for hemodynamic measurement.  The venous tract was very tortuous.  A 0.25 Glidewire was used to help advance the catheter.  We initially got the catheter into the RV but it would not advance beyond the RV.  With the wire in place, the catheter was removed completely and flushed.  It was then successfully advanced into the PA and PCWP position. * Simultaneous FA & PA blood gases checked for SaO2% to calculate FICK CO/CI   * Catheter removed completely out of the body with balloon deflated.  CORONARY ANGIOGRAPHY (aortic valve not crossed): 5Fr TIG 4.0 catheters advanced or exchanged over a J-wire under direct fluoroscopic guidance into the ascending aorta and used to engage the left and right coronary arteries for  cineangiography.  Upon completion of Angiogaphy, the catheter was removed completely out of the body over a wire, without complication.  Femoral / Brachial Sheath(s) removed in the Cath Lab with manual pressure for hemostasis.    Radial sheath removed in the Cardiac Catheterization lab with TR Band placed for hemostasis.  TR Band: 1410  Hours; 15 mL air  MEDICATIONS * SQ Lidocaine 338m* Radial Cocktail: 3 mg Verapmil in 10 mL NS * Isovue Contrast: 60 Ml * Heparin: 4000 Units Estimated blood loss <50 mL.   During this procedure medications were administered to achieve and maintain moderate conscious sedation while the patient's heart rate, blood pressure, and oxygen saturation were continuously monitored and I was present face-to-face 100% of this time.  Medications (Filter: Administrations occurring from 1302 to 1409 on 02/16/20) midazolam (VERSED) injection (mg) Total dose:  2 mg Date/Time  Rate/Dose/Volume Action  02/16/20 1306  2 mg Given    fentaNYL (SUBLIMAZE) injection (mcg) Total dose:  25 mcg Date/Time  Rate/Dose/Volume Action  02/16/20 1307  25 mcg Given    lidocaine (PF) (XYLOCAINE) 1 % injection (mL) Total volume:  4 mL Date/Time  Rate/Dose/Volume Action  02/16/20 1309  2 mL Given  1311  2 mL Given    Radial Cocktail/Verapamil only (mL) Total volume:  10 mL Date/Time  Rate/Dose/Volume Action  02/16/20 1311  10 mL Given    Heparin (Porcine) in NaCl 1000-0.9 UT/500ML-% SOLN (mL) Total volume:  1,500 mL Date/Time  Rate/Dose/Volume Action  02/16/20 1312  500 mL Given  1312  500 mL Given  1313  500 mL Given    heparin sodium (porcine) injection (Units) Total dose:  4,000 Units Date/Time  Rate/Dose/Volume Action  02/16/20 1353  4,000 Units Given    iohexol (OMNIPAQUE) 350 MG/ML injection (mL)  Total volume:  40 mL Date/Time  Rate/Dose/Volume Action  02/16/20 1408  40 mL Given    Sedation Time  Sedation Time Physician-1: 55 minutes 8 seconds   Contrast  Medication Name Total Dose  iohexol (OMNIPAQUE) 350 MG/ML injection 40 mL    Radiation/Fluoro  Fluoro time: 17.5 (min) DAP: 17013 (mGycm2) Cumulative Air Kerma: 836 (mGy)  Complications  Complications documented before study signed (02/16/2020 6:29 PM)   No complications were associated with this study.  Documented by Cesar Man, MD - 02/16/2020 2:42 PM    Coronary Findings  Diagnostic Dominance: Right Left Main  Vessel was injected. Vessel is normal in caliber. Vessel is angiographically normal.  Left Anterior Descending  Vessel was injected. Vessel is normal in caliber. Vessel is angiographically normal.  Third Diagonal Branch  Vessel is small in size.  Left Circumflex  Vessel was injected. Vessel is normal in caliber and moderate in size. Vessel is angiographically normal.  First Obtuse Marginal Branch  Vessel is small in size.  Left Posterior Atrioventricular Artery  Vessel is small in size.  Right Coronary Artery  Vessel was injected. Vessel is normal in caliber and moderate in size. Vessel is angiographically normal.  Right Ventricular Branch  Vessel was injected. Vessel is small in size. Vessel is angiographically normal.  Right Posterior Descending Artery  Vessel was injected. Vessel is small in size. Vessel is angiographically normal.  Right Posterior Atrioventricular Artery  Vessel was injected. Vessel is moderate in size. Vessel is angiographically normal.  First Right Posterolateral Branch  Vessel was injected. Vessel is small in size. Vessel is angiographically normal.  Second Right Posterolateral Branch  Vessel was injected. Vessel is small in size. Vessel is angiographically normal.  Intervention  No interventions have been documented. Right Heart  Right Heart Pressures Hemodynamic findings consistent with moderate pulmonary hypertension. PAP-mean: 67/25 mmHg - 41 mmHg PCWP 27 mmHg Not assessed, elevated PCWP. Ao pressure 136/74  mmHg-MAP 99 mmHg Ao sat 95%, PA sat 56%: CARDIAC OUTPUT-INDEX (Fick): 3.25-122  Right Atrium The right atrium is moderately dilated.  Right atrial pressure is normal. RAP mean 6 mmHg  Right Ventricle RVP-EDP: 67/4 mmHg - 9 mmHg  Wall Motion  Resting    No LV gram performed.        Left Heart  Left Ventricle There is severe left ventricular systolic dysfunction. Documented by echocardiogram and TEE The left ventricular ejection fraction is 25-35% by visual estimate.  Aortic Valve There is severe aortic valve stenosis. By TEE -> mean gradient 56 mmHg The aortic valve is calcified.  Coronary Diagrams  Diagnostic Dominance: Right  Intervention  Implants   No implant documentation for this case.  Syngo Images  Show images for CARDIAC CATHETERIZATION Images on Long Term Storage  Show images for Nofziger, Zamar Odwyer "Ed" Link to Procedure Log  Procedure Log    Hemo Data   Most Recent Value  Fick Cardiac Output 3.25 L/min  Fick Cardiac Output Index 1.82 (L/min)/BSA  RA A Wave 9 mmHg  RA V Wave 6 mmHg  RA Mean 5 mmHg  RV Systolic Pressure 67 mmHg  RV Diastolic Pressure 4 mmHg  RV EDP 9 mmHg  PA Systolic Pressure 67 mmHg  PA Diastolic Pressure 25 mmHg  PA Mean 41 mmHg  PW A Wave 27 mmHg  PW V Wave 30 mmHg  PW Mean 26 mmHg  AO Systolic Pressure 476 mmHg  AO Diastolic Pressure 74 mmHg  AO Mean 99 mmHg  QP/QS  1  TPVR Index 22.55 HRUI  TSVR Index 54.45 HRUI  PVR SVR Ratio 0.16  TPVR/TSVR Ratio 0.41   ADDENDUM REPORT: 02/24/2020 13:35  CLINICAL DATA:  79 year old male with severe aortic stenosis being evaluated for a TAVR procedure vs AVR.  EXAM: Cardiac TAVR CT  TECHNIQUE: The patient was scanned on a [employer]. A 120 kV retrospective scan was triggered in the descending thoracic aorta at 111 HU's. Gantry rotation speed was 250 msecs and collimation was .6 mm. Carvedilol 3.125 mg PO and no nitro were given. The 3D data set was reconstructed in  5% intervals of the R-R cycle. Systolic and diastolic phases were analyzed on a dedicated work station using MPR, MIP and VRT modes. The patient received 80 cc of contrast.  FINDINGS: Aortic Valve: Trileaflet aortic valve with severely calcified leaflets and severely restricted leaflet opening but only minimal calcifications extending into the LVOT. Aortic valve calcium score 4399 consistent with severe aortic stenosis.  Aorta: Normal size with mild diffuse atherosclerotic plaque and calcifications. No dissection.  Sinotubular Junction: 29.0 x 27.9 mm  Ascending Thoracic Aorta: 36.6 x 36.6 mm  Aortic Arch: 23.6 x 23.1 mm  Descending Thoracic Aorta: 20.4 x 19.9 mm  Sinus of Valsalva Measurements:  Non-coronary: 32 mm  Right -coronary: 31 mm  Left -coronary: 32 mm  Coronary Artery Height above Annulus:  Left Main: 13 mm  Right Coronary: 19 mm  Virtual Basal Annulus Measurements:  Maximum/Minimum Diameter: 28.0 x 22.0 mm  Mean Diameter: 24.8 mm  Perimeter: 80.1 mm  Area: 483 mm2  Optimum Fluoroscopic Angle for Delivery: LAO 11 CAU 14  Mildly dilated pulmonary artery measuring 31 mm.  IMPRESSION: 1. Trileaflet aortic valve with severely calcified leaflets and severely restricted leaflet opening but only minimal calcifications extending into the LVOT. Aortic valve calcium score 4399 consistent with severe aortic stenosis. Annular measurements suitable for delivery of a 26 mm Edwards-SAPIEN 3 valve.  2. Sufficient coronary to annulus distance.  3. Optimum Fluoroscopic Angle for Delivery:  LAO 11 CAU 14.  4. No thrombus in the left atrial appendage.   Electronically Signed   By: Ena Dawley   On: 02/24/2020 13:35   Addended by Dorothy Spark, MD on 02/24/2020 1:38 PM  Study Result  Addenda  ADDENDUM REPORT: 02/24/2020 13:35  CLINICAL DATA:  79 year old male with severe aortic stenosis being evaluated for a TAVR  procedure vs AVR.  EXAM: Cardiac TAVR CT  TECHNIQUE: The patient was scanned on a [employer]. A 120 kV retrospective scan was triggered in the descending thoracic aorta at 111 HU's. Gantry rotation speed was 250 msecs and collimation was .6 mm. Carvedilol 3.125 mg PO and no nitro were given. The 3D data set was reconstructed in 5% intervals of the R-R cycle. Systolic and diastolic phases were analyzed on a dedicated work station using MPR, MIP and VRT modes. The patient received 80 cc of contrast.  FINDINGS: Aortic Valve: Trileaflet aortic valve with severely calcified leaflets and severely restricted leaflet opening but only minimal calcifications extending into the LVOT. Aortic valve calcium score 4399 consistent with severe aortic stenosis.  Aorta: Normal size with mild diffuse atherosclerotic plaque and calcifications. No dissection.  Sinotubular Junction: 29.0 x 27.9 mm  Ascending Thoracic Aorta: 36.6 x 36.6 mm  Aortic Arch: 23.6 x 23.1 mm  Descending Thoracic Aorta: 20.4 x 19.9 mm  Sinus of Valsalva Measurements:  Non-coronary: 32 mm  Right -coronary: 31 mm  Left -coronary: 32  mm  Coronary Artery Height above Annulus:  Left Main: 13 mm  Right Coronary: 19 mm  Virtual Basal Annulus Measurements:  Maximum/Minimum Diameter: 28.0 x 22.0 mm  Mean Diameter: 24.8 mm  Perimeter: 80.1 mm  Area: 483 mm2  Optimum Fluoroscopic Angle for Delivery: LAO 11 CAU 14  Mildly dilated pulmonary artery measuring 31 mm.  IMPRESSION: 1. Trileaflet aortic valve with severely calcified leaflets and severely restricted leaflet opening but only minimal calcifications extending into the LVOT. Aortic valve calcium score 4399 consistent with severe aortic stenosis. Annular measurements suitable for delivery of a 26 mm Edwards-SAPIEN 3 valve.  2. Sufficient coronary to annulus distance.  3. Optimum Fluoroscopic Angle for Delivery:  LAO 11  CAU 14.  4. No thrombus in the left atrial appendage.   Electronically Signed   By: Ena Dawley   On: 02/24/2020 13:35   Signed by Dorothy Spark, MD on 02/24/2020 1:38 PM  Narrative & Impression  EXAM: OVER-READ INTERPRETATION  CT CHEST  The following report is an over-read performed by radiologist Dr. Vinnie Langton of [facility], [place] on 02/23/2020. This over-read does not include interpretation of cardiac or coronary anatomy or pathology. The coronary calcium score/coronary CTA interpretation by the cardiologist is attached.  COMPARISON:  No priors.  FINDINGS: Extracardiac findings will be described separately under dictation for contemporaneously obtained CTA chest, abdomen and pelvis.  IMPRESSION: Please see separate dictation for contemporaneously obtained CTA chest, abdomen and pelvis dated 02/23/2020 for full description of relevant extracardiac findings.  Electronically Signed: By: Vinnie Langton M.D. On: 02/23/2020 12:03     Narrative & Impression  CLINICAL DATA:  79 year old male with history of severe aortic stenosis. Preprocedural study prior to potential transcatheter aortic valve replacement (TAVR) procedure.  EXAM: CT ANGIOGRAPHY CHEST, ABDOMEN AND PELVIS  TECHNIQUE: Non-contrast CT of the chest was initially obtained.  Multidetector CT imaging through the chest, abdomen and pelvis was performed using the standard protocol during bolus administration of intravenous contrast. Multiplanar reconstructed images and MIPs were obtained and reviewed to evaluate the vascular anatomy.  CONTRAST:  165m OMNIPAQUE IOHEXOL 350 MG/ML SOLN  COMPARISON:  No priors.  FINDINGS: CTA CHEST FINDINGS  Cardiovascular: Heart size is mildly enlarged. There is no significant pericardial fluid, thickening or pericardial calcification. There is aortic atherosclerosis, as well as atherosclerosis of the great vessels of the  mediastinum and the coronary arteries, including calcified atherosclerotic plaque in the left anterior descending coronary artery. Severe thickening calcification of the aortic valve. Calcifications of the mitral annulus.  Mediastinum/Lymph Nodes: No pathologically enlarged mediastinal or hilar lymph nodes. Esophagus is unremarkable in appearance. No axillary lymphadenopathy.  Lungs/Pleura: Moderate bilateral pleural effusions lying dependently with extensive passive subsegmental atelectasis in the lower lobes of the lungs bilaterally. No confluent consolidative airspace disease. No suspicious appearing pulmonary nodules or masses are noted.  Musculoskeletal/Soft Tissues: There are no aggressive appearing lytic or blastic lesions noted in the visualized portions of the skeleton.  CTA ABDOMEN AND PELVIS FINDINGS  Hepatobiliary: Low-attenuation lesions in the liver compatible with simple cysts, largest of which is in segment 2 measuring 1.6 x 1.4 cm. No other suspicious appearing cystic or solid hepatic lesions. Gallbladder is normal in appearance.  Pancreas: No pancreatic mass. No pancreatic ductal dilatation. No pancreatic or peripancreatic fluid collections or inflammatory changes.  Spleen: Unremarkable.  Adrenals/Urinary Tract: Mild adreniform thickening of the adrenal glands bilaterally, favored to reflect adrenal hyperplasia. Bilateral kidneys are normal in appearance. No hydroureteronephrosis. Urinary bladder is  unremarkable in appearance.  Stomach/Bowel: Normal appearance of the stomach. No pathologic dilatation of small bowel or colon. Numerous colonic diverticulae are noted, particularly in the sigmoid colon, without definite surrounding inflammatory changes to suggest an acute diverticulitis at this time. Normal appendix.  Vascular/Lymphatic: Aortic atherosclerosis, without evidence of aneurysm or dissection in the abdominal or pelvic vasculature.  No lymphadenopathy noted in the abdomen or pelvis.  Reproductive: Prostate gland and seminal vesicles are unremarkable in appearance.  Other: No significant volume of ascites.  No pneumoperitoneum.  Musculoskeletal: There are no aggressive appearing lytic or blastic lesions noted in the visualized portions of the skeleton.  VASCULAR MEASUREMENTS PERTINENT TO TAVR:  AORTA:  Minimal Aortic Diameter-13 x 14 mm  Severity of Aortic Calcification-moderate to severe  RIGHT PELVIS:  Right Common Iliac Artery -  Minimal Diameter-7.5 x 6.8 mm  Tortuosity-moderate  Calcification-moderate  Right External Iliac Artery -  Minimal Diameter-6.9 x 6.3 mm  Tortuosity-moderate  Calcification-none  Right Common Femoral Artery -  Minimal Diameter-8.7 x 5.6 mm  Tortuosity-mild  Calcification-mild  LEFT PELVIS:  Left Common Iliac Artery -  Minimal Diameter-8.1 x 7.8 mm  Tortuosity-mild  Calcification-moderate  Left External Iliac Artery -  Minimal Diameter-6.6 x 6.9 mm  Tortuosity-moderate  Calcification-none  Left Common Femoral Artery -  Minimal Diameter-7.3 x 7.5 mm  Tortuosity-mild  Calcification-mild  Review of the MIP images confirms the above findings.  IMPRESSION: 1. Vascular findings and measurements pertinent to potential TAVR procedure, as detailed above. 2. Severe thickening calcification of the aortic valve, compatible with the reported clinical history of severe aortic stenosis. 3. Aortic atherosclerosis, in addition to left anterior descending coronary artery disease. Assessment for potential risk factor modification, dietary therapy or pharmacologic therapy may be warranted, if clinically indicated. 4. Moderate bilateral pleural effusions lying dependently with extensive passive subsegmental atelectasis in the lower lobes of the lungs bilaterally. 5. Colonic diverticulosis without evidence of acute  diverticulitis at this time. 6. Additional incidental findings, as above   Electronically Signed   By: Vinnie Langton M.D.   On: 02/23/2020 14:56    STS Risk Score: Risk of Mortality: 1.717% Renal Failure: 1.319% Permanent Stroke: 1.184% Prolonged Ventilation: 7.402% DSW Infection: 0.051% Reoperation: 6.053% Morbidity or Mortality: 13.396% Short Length of Stay: 30.711% Long Length of Stay: 6.897%  Impression:  This 79 year old gentleman has stage D, severe, symptomatic aortic stenosis with New York Heart Association class III symptoms of exertional fatigue and shortness of breath with minimal activity as well as markedly decreased ejection fraction of 25% consistent with combined chronic systolic and diastolic congestive heart failure..  He has also had lower extremity edema and an episode last week of left-sided chest discomfort that lasted most of the day.  I have personally reviewed his 2D echocardiogram, TEE, cardiac catheterization, and CTA studies.  His 2D echo showed a trileaflet aortic valve with severe calcification and restricted mobility.  The mean gradient was measured at 41 mmHg with an aortic valve area of 0.49 cm.  Left ventricular ejection fraction was 25 to 30%.  TEE confirmed the above findings and showed moderate mitral regurgitation.  Left ventricular ejection fraction was estimated 20 to 25% by TEE with global hypokinesis.  Right ventricular systolic function was mildly to moderately reduced with mild right ventricular enlargement.  Cardiac catheterization showed no significant coronary disease.  There is moderate pulmonary hypertension and a low cardiac index of 1.82.  I agree that aortic valve replacement is indicated in this patient for relief  of his symptoms and to prevent further left ventricular deterioration.  Given his age and reduced ejection fraction I think TAVR is the best option for him.  His gated cardiac CTA shows anatomy suitable for TAVR  using a SAPIEN 3 valve.  His abdominal and pelvic CTA shows adequate pelvic vascular anatomy to allow transfemoral insertion.  CTA of the chest shows moderate bilateral pleural effusions with bilateral lower lobe atelectasis.  He would benefit from having these drained at some point but I think I would wait until he has his valve fixed in case he has a vagal reaction and hypotension which could be catastrophic with severe aortic stenosis.  He said that he can lay flat without any difficulty at this time.  The patient was counseled at length regarding treatment alternatives for management of severe symptomatic aortic stenosis. The risks and benefits of surgical intervention has been discussed in detail. Long-term prognosis with medical therapy was discussed. Alternative approaches such as conventional surgical aortic valve replacement, transcatheter aortic valve replacement, and palliative medical therapy were compared and contrasted at length. This discussion was placed in the context of the patient's own specific clinical presentation and past medical history. All of his questions have been addressed.   Following the decision to proceed with transcatheter aortic valve replacement, a discussion was held regarding what types of management strategies would be attempted intraoperatively in the event of life-threatening complications, including whether or not the patient would be considered a candidate for the use of cardiopulmonary bypass and/or conversion to open sternotomy for attempted surgical intervention. The patient is aware of the fact that transient use of cardiopulmonary bypass may be necessary.  Although he is 79 years old with severe left ventricular systolic dysfunction I would consider him a candidate for emergent sternotomy to manage any intraoperative complications, although the risk of this would be significantly increased if required.  The patient has been advised of a variety of complications  that might develop including but not limited to risks of death, stroke, paravalvular leak, aortic dissection or other major vascular complications, aortic annulus rupture, device embolization, cardiac rupture or perforation, mitral regurgitation, acute myocardial infarction, arrhythmia, heart block or bradycardia requiring permanent pacemaker placement, congestive heart failure, respiratory failure, renal failure, pneumonia, infection, other late complications related to structural valve deterioration or migration, or other complications that might ultimately cause a temporary or permanent loss of functional independence or other long term morbidity. The patient provides full informed consent for the procedure as described and all questions were answered.       Plan:  He will be scheduled for transfemoral TAVR on 03/14/2020.  I spent 70 minutes performing this consultation and > 50% of this time was spent face to face counseling and coordinating the care of this patient's severe symptomatic aortic stenosis.     Gaye Pollack, MD 03/01/2020 10:52 AM

## 2020-03-07 ENCOUNTER — Other Ambulatory Visit: Payer: Self-pay | Admitting: Physician Assistant

## 2020-03-07 ENCOUNTER — Other Ambulatory Visit: Payer: Self-pay

## 2020-03-07 DIAGNOSIS — I350 Nonrheumatic aortic (valve) stenosis: Secondary | ICD-10-CM

## 2020-03-07 MED ORDER — CARVEDILOL 3.125 MG PO TABS: 3.1250 mg | ORAL_TABLET | Freq: Two times a day (BID) | ORAL | 3 refills | Status: DC

## 2020-03-07 MED ORDER — LORAZEPAM 1 MG PO TABS: 0.5000 mg | ORAL_TABLET | Freq: Once | ORAL | 0 refills | Status: AC

## 2020-03-07 NOTE — Progress Notes (Signed)
Ativan sent into pharmacy for night before surgery  Angelena Form PA-C  [facility]

## 2020-03-09 NOTE — Progress Notes (Signed)
[id number] [place] [place] [address] [place] [address] Phone: [phone] Fax: [phone]      Your procedure is scheduled on Tuesday 03/14/2020.  Report to [facility] Main Entrance "A" at 08:00 A.M., and check in at the Admitting office.  Call this number if you have problems the morning of surgery:  [phone]  Call [phone] if you have any questions prior to your surgery date Monday-Friday 8am-4pm    Remember:  Do not eat or drink after surgery after midnight the night before your surgery   Continue taking all medications without change through the day before surgery. On the morning of surgery do not take any medications                     Do not wear jewelry            Do not wear lotions, powders, colognes, or deodorant.            Men may shave face and neck.            Do not bring valuables to the hospital.            [facility] is not responsible for any belongings or valuables.  Do NOT Smoke (Tobacco/Vaping) or drink Alcohol 24 hours prior to your procedure  If you use a CPAP at night, you may bring all equipment for your overnight stay.   Contacts, glasses, dentures or bridgework may not be worn into surgery.      For patients admitted to the hospital, discharge time will be determined by your treatment team.   Patients discharged the day of surgery will not be allowed to drive home, and someone needs to stay with them for 24 hours.    Special instructions:   [place]- Preparing For Surgery  Before surgery, you can play an important role. Because skin is not sterile, your skin needs to be as free of germs as possible. You can reduce the number of germs on your skin by washing with CHG (chlorahexidine gluconate) Soap before surgery.  CHG is an antiseptic cleaner which kills germs and bonds with the skin to continue killing germs even after washing.    Oral Hygiene  is also important to reduce your risk of infection.  Remember - BRUSH YOUR TEETH THE MORNING OF SURGERY WITH YOUR REGULAR TOOTHPASTE  Please do not use if you have an allergy to CHG or antibacterial soaps. If your skin becomes reddened/irritated stop using the CHG.  Do not shave (including legs and underarms) for at least 48 hours prior to first CHG shower. It is OK to shave your face.  Please follow these instructions carefully.   1. Shower the NIGHT BEFORE SURGERY and the MORNING OF SURGERY with CHG Soap.   2. If you chose to wash your hair, wash your hair first as usual with your normal shampoo.  3. After you shampoo, rinse your hair and body thoroughly to remove the shampoo.  4. Use CHG as you would any other liquid soap. You can apply CHG directly to the skin and wash gently with a scrungie or a clean washcloth.   5. Apply the CHG Soap to your body ONLY FROM THE NECK DOWN.  Do not use on open wounds or open sores. Avoid contact with your eyes, ears, mouth and genitals (private parts). Wash Face and genitals (private parts)  with your normal soap.   6. Wash thoroughly, paying special attention to the area where your surgery will be performed.  7. Thoroughly rinse your body with warm water from the neck down.  8. DO NOT shower/wash with your normal soap after using and rinsing off the CHG Soap.  9. Pat yourself dry with a CLEAN TOWEL.  10. Wear CLEAN PAJAMAS to bed the night before surgery  11. Place CLEAN SHEETS on your bed the night of your first shower and DO NOT SLEEP WITH PETS.   Day of Surgery: Shower with CHG soap as directed Wear Clean/Comfortable clothing the morning of surgery Do not apply any deodorants/lotions.   Remember to brush your teeth WITH YOUR REGULAR TOOTHPASTE.   Please read over the following fact sheets that you were given.

## 2020-03-10 ENCOUNTER — Encounter (HOSPITAL_COMMUNITY)
Admission: RE | Admit: 2020-03-10 | Discharge: 2020-03-10 | Disposition: A | Discharge status: Home / Self Care | Payer: Medicare Other | Source: Ambulatory Visit | Attending: Cardiovascular Disease | Admitting: Cardiovascular Disease

## 2020-03-10 ENCOUNTER — Ambulatory Visit (HOSPITAL_COMMUNITY)
Admission: RE | Admit: 2020-03-10 | Discharge: 2020-03-10 | Disposition: A | Discharge status: Home / Self Care | Payer: Medicare Other | Source: Ambulatory Visit | Attending: Cardiovascular Disease | Admitting: Cardiovascular Disease

## 2020-03-10 ENCOUNTER — Other Ambulatory Visit: Payer: Self-pay

## 2020-03-10 ENCOUNTER — Encounter (HOSPITAL_COMMUNITY): Payer: Self-pay

## 2020-03-10 DIAGNOSIS — E7800 Pure hypercholesterolemia, unspecified: Secondary | ICD-10-CM | POA: Diagnosis not present

## 2020-03-10 DIAGNOSIS — I350 Nonrheumatic aortic (valve) stenosis: Secondary | ICD-10-CM

## 2020-03-10 DIAGNOSIS — Z79899 Other long term (current) drug therapy: Secondary | ICD-10-CM | POA: Diagnosis not present

## 2020-03-10 DIAGNOSIS — Z01818 Encounter for other preprocedural examination: Secondary | ICD-10-CM | POA: Diagnosis not present

## 2020-03-10 DIAGNOSIS — J9811 Atelectasis: Secondary | ICD-10-CM | POA: Diagnosis not present

## 2020-03-10 DIAGNOSIS — Z8616 Personal history of COVID-19: Secondary | ICD-10-CM | POA: Diagnosis not present

## 2020-03-10 DIAGNOSIS — Z7982 Long term (current) use of aspirin: Secondary | ICD-10-CM | POA: Insufficient documentation

## 2020-03-10 DIAGNOSIS — J90 Pleural effusion, not elsewhere classified: Secondary | ICD-10-CM | POA: Diagnosis not present

## 2020-03-10 DIAGNOSIS — I5042 Chronic combined systolic (congestive) and diastolic (congestive) heart failure: Secondary | ICD-10-CM | POA: Diagnosis not present

## 2020-03-10 DIAGNOSIS — I110 Hypertensive heart disease with heart failure: Secondary | ICD-10-CM | POA: Diagnosis not present

## 2020-03-10 DIAGNOSIS — I428 Other cardiomyopathies: Secondary | ICD-10-CM | POA: Diagnosis not present

## 2020-03-10 HISTORY — DX: Unspecified hearing loss, unspecified ear: H91.90

## 2020-03-10 HISTORY — DX: Tinnitus, bilateral: H93.13

## 2020-03-10 LAB — COMPREHENSIVE METABOLIC PANEL
ALT: 15 U/L (ref 0–44)
AST: 16 U/L (ref 15–41)
Albumin: 3.4 g/dL — ABNORMAL LOW (ref 3.5–5.0)
Alkaline Phosphatase: 48 U/L (ref 38–126)
Anion gap: 10 (ref 5–15)
BUN: 20 mg/dL (ref 8–23)
CO2: 22 mmol/L (ref 22–32)
Calcium: 9.5 mg/dL (ref 8.9–10.3)
Chloride: 107 mmol/L (ref 98–111)
Creatinine, Ser: 0.98 mg/dL (ref 0.61–1.24)
GFR calc Af Amer: >60 mL/min (ref >60)
GFR calc non Af Amer: >60 mL/min (ref >60)
Glucose, Bld: 107 mg/dL — ABNORMAL HIGH (ref 70–99)
Potassium: 4 mmol/L (ref 3.5–5.1)
Sodium: 139 mmol/L (ref 135–145)
Total Bilirubin: 0.8 mg/dL (ref 0.3–1.2)
Total Protein: 6.4 g/dL — ABNORMAL LOW (ref 6.5–8.1)

## 2020-03-10 LAB — BLOOD GAS, ARTERIAL
Acid-Base Excess: 1.3 mmol/L (ref 0.0–2.0)
Bicarbonate: 25.1 mmol/L (ref 20.0–28.0)
FIO2: 21
O2 Saturation: 97.7 %
Patient temperature: 37
pCO2 arterial: 38.1 mmHg (ref 32.0–48.0)
pH, Arterial: 7.434 (ref 7.350–7.450)
pO2, Arterial: 101 mmHg (ref 83.0–108.0)

## 2020-03-10 LAB — CBC
HCT: 40.8 % (ref 39.0–52.0)
Hemoglobin: 12.5 g/dL — ABNORMAL LOW (ref 13.0–17.0)
MCH: 27.7 pg (ref 26.0–34.0)
MCHC: 30.6 g/dL (ref 30.0–36.0)
MCV: 90.3 fL (ref 80.0–100.0)
Platelets: 197 10*3/uL (ref 150–400)
RBC: 4.52 MIL/uL (ref 4.22–5.81)
RDW: 15.2 % (ref 11.5–15.5)
WBC: 7.1 10*3/uL (ref 4.0–10.5)
nRBC: 0 % (ref 0.0–0.2)

## 2020-03-10 LAB — URINALYSIS, ROUTINE W REFLEX MICROSCOPIC
Bilirubin Urine: NEGATIVE
Glucose, UA: NEGATIVE mg/dL
Hgb urine dipstick: NEGATIVE
Ketones, ur: NEGATIVE mg/dL
Leukocytes,Ua: NEGATIVE
Nitrite: NEGATIVE
Protein, ur: NEGATIVE mg/dL
Specific Gravity, Urine: 1.005 (ref 1.005–1.030)
pH: 5 (ref 5.0–8.0)

## 2020-03-10 LAB — TYPE AND SCREEN
ABO/RH(D): AB POS
Antibody Screen: NEGATIVE

## 2020-03-10 LAB — HEMOGLOBIN A1C
Hgb A1c MFr Bld: 6 % — ABNORMAL HIGH (ref 4.8–5.6)
Mean Plasma Glucose: 125.5 mg/dL

## 2020-03-10 LAB — APTT: aPTT: 35 seconds (ref 24–36)

## 2020-03-10 LAB — BRAIN NATRIURETIC PEPTIDE: B Natriuretic Peptide: 3572.8 pg/mL — ABNORMAL HIGH (ref 0.0–100.0)

## 2020-03-10 LAB — PROTIME-INR
INR: 1 (ref 0.8–1.2)
Prothrombin Time: 12.9 seconds (ref 11.4–15.2)

## 2020-03-10 LAB — SURGICAL PCR SCREEN
MRSA, PCR: NEGATIVE
Staphylococcus aureus: NEGATIVE

## 2020-03-10 NOTE — Progress Notes (Signed)
Carotid duplex has been completed.   Preliminary results in CV Proc.   Abram Sander 03/10/2020 1:05 PM

## 2020-03-10 NOTE — Progress Notes (Signed)
PCP - Cristie Hem, MD Cardiologist - Glenetta Hew, MD  PPM/ICD - Denies  Chest x-ray - 03/10/20 EKG - 03/10/20 Stress Test - 2006 ECHO - 02/21/20 Cardiac Cath - 02/16/20  Sleep Study - Denies   Patient denies bring diabetic.  Blood Thinner Instructions: N/A Aspirin Instructions: N/A  ERAS Protcol - N/A PRE-SURGERY Ensure or G2- N/A  COVID TEST- 03/11/20   Anesthesia review: Yes, cardiac hx.  Patient denies shortness of breath, fever, cough and chest pain at PAT appointment   All instructions explained to the patient, with a verbal understanding of the material. Patient agrees to go over the instructions while at home for a better understanding. Patient also instructed to self quarantine after being tested for COVID-19. The opportunity to ask questions was provided.

## 2020-03-10 NOTE — Progress Notes (Signed)
Your procedure is scheduled on Tuesday, September 14, from 10:10 AM- 11:57 AM.  Report to [facility] Main Entrance "A" at 08:00 A.M., and check in at the Admitting office.  Call this number if you have problems the morning of surgery:  [phone].  Call [phone] if you have any questions prior to your surgery date Monday-Friday 8am-4pm    Remember:  Do not eat or drink after surgery after midnight the night before your surgery.   Continue taking all medications without change through the day before surgery. On the morning of surgery do not take any medications          The Morning of Surgery:            Do not wear jewelry            Do not wear lotions, powders, colognes, or deodorant.            Men may shave face and neck.            Do not bring valuables to the hospital.            [facility] is not responsible for any belongings or valuables.  Do NOT Smoke (Tobacco/Vaping) or drink Alcohol 24 hours prior to your procedure.  If you use a CPAP at night, you may bring all equipment for your overnight stay.   Contacts, glasses, dentures or bridgework may not be worn into surgery.      For patients admitted to the hospital, discharge time will be determined by your treatment team.   Patients discharged the day of surgery will not be allowed to drive home, and someone needs to stay with them for 24 hours.    Special instructions:   [place]- Preparing For Surgery  Before surgery, you can play an important role. Because skin is not sterile, your skin needs to be as free of germs as possible. You can reduce the number of germs on your skin by washing with CHG (chlorahexidine gluconate) Soap before surgery.  CHG is an antiseptic cleaner which kills germs and bonds with the skin to continue killing germs even after washing.    Oral Hygiene is also important to reduce your risk of infection.  Remember - BRUSH YOUR TEETH THE MORNING OF SURGERY WITH YOUR REGULAR  TOOTHPASTE  Please do not use if you have an allergy to CHG or antibacterial soaps. If your skin becomes reddened/irritated stop using the CHG.  Do not shave (including legs and underarms) for at least 48 hours prior to first CHG shower. It is OK to shave your face.  Please follow these instructions carefully.   1. Shower the NIGHT BEFORE SURGERY and the MORNING OF SURGERY with CHG Soap.   2. If you chose to wash your hair, wash your hair first as usual with your normal shampoo.  3. After you shampoo, rinse your hair and body thoroughly to remove the shampoo.  4. Use CHG as you would any other liquid soap. You can apply CHG directly to the skin and wash gently with a scrungie or a clean washcloth.   5. Apply the CHG Soap to your body ONLY FROM THE NECK DOWN.  Do not use on open wounds or open sores. Avoid contact with your eyes, ears, mouth and genitals (private parts). Wash Face and genitals (private parts)  with your normal soap.   6. Wash thoroughly, paying special attention to the area where your surgery will be performed.  7. Thoroughly rinse your body with warm water from the neck down.  8. DO NOT shower/wash with your normal soap after using and rinsing off the CHG Soap.  9. Pat yourself dry with a CLEAN TOWEL.  10. Wear CLEAN PAJAMAS to bed the night before surgery  11. Place CLEAN SHEETS on your bed the night of your first shower and DO NOT SLEEP WITH PETS.   Day of Surgery: Shower with CHG soap as directed Wear Clean/Comfortable clothing the morning of surgery Do not apply any deodorants/lotions.   Remember to brush your teeth WITH YOUR REGULAR TOOTHPASTE.   Please read over the following fact sheets that you were given.

## 2020-03-11 ENCOUNTER — Other Ambulatory Visit (HOSPITAL_COMMUNITY)
Admission: RE | Admit: 2020-03-11 | Discharge: 2020-03-11 | Disposition: A | Discharge status: Home / Self Care | Payer: Medicare Other | Source: Ambulatory Visit | Attending: Cardiovascular Disease | Admitting: Cardiovascular Disease

## 2020-03-11 DIAGNOSIS — Z01812 Encounter for preprocedural laboratory examination: Secondary | ICD-10-CM | POA: Insufficient documentation

## 2020-03-11 DIAGNOSIS — Z20822 Contact with and (suspected) exposure to covid-19: Secondary | ICD-10-CM | POA: Insufficient documentation

## 2020-03-11 LAB — SARS CORONAVIRUS 2 (TAT 6-24 HRS): SARS Coronavirus 2: NEGATIVE

## 2020-03-13 ENCOUNTER — Encounter (HOSPITAL_COMMUNITY): Payer: Self-pay

## 2020-03-13 MED ORDER — MAGNESIUM SULFATE 50 % IJ SOLN
40.0000 meq | INTRAMUSCULAR | Status: DC
  Filled 2020-03-13: qty 9.85

## 2020-03-13 MED ORDER — DEXMEDETOMIDINE HCL IN NACL 400 MCG/100ML IV SOLN
0.1000 ug/kg/h | INTRAVENOUS | Status: AC
  Administered 2020-03-14: 1 ug/kg/h via INTRAVENOUS
  Administered 2020-03-14: 58.52 ug via INTRAVENOUS
  Filled 2020-03-13: qty 100

## 2020-03-13 MED ORDER — SODIUM CHLORIDE 0.9 % IV SOLN
INTRAVENOUS | Status: DC
  Filled 2020-03-13 (×2): qty 30

## 2020-03-13 MED ORDER — VANCOMYCIN HCL 1250 MG/250ML IV SOLN
1250.0000 mg | INTRAVENOUS | Status: AC
  Administered 2020-03-14: 1250 mg via INTRAVENOUS
  Filled 2020-03-13 (×2): qty 250

## 2020-03-13 MED ORDER — SODIUM CHLORIDE 0.9 % IV SOLN
1.5000 g | INTRAVENOUS | Status: AC
  Administered 2020-03-14: 1.5 g via INTRAVENOUS
  Filled 2020-03-13 (×2): qty 1.5

## 2020-03-13 MED ORDER — NOREPINEPHRINE 4 MG/250ML-% IV SOLN
0.0000 ug/min | INTRAVENOUS | Status: DC
  Filled 2020-03-13: qty 250

## 2020-03-13 MED ORDER — POTASSIUM CHLORIDE 2 MEQ/ML IV SOLN
80.0000 meq | INTRAVENOUS | Status: DC
  Filled 2020-03-13: qty 40

## 2020-03-13 NOTE — Anesthesia Preprocedure Evaluation (Addendum)
Anesthesia Evaluation  Patient identified by MRN, date of birth, ID band Patient awake    Reviewed: Allergy & Precautions, NPO status , Patient's Chart, lab work & pertinent test results  History of Anesthesia Complications Negative for: history of anesthetic complications  Airway Mallampati: I  TM Distance: >3 FB Neck ROM: Full    Dental  (+) Dental Advisory Given   Pulmonary shortness of breath, neg COPD,    breath sounds clear to auscultation       Cardiovascular hypertension, Pt. on medications +CHF and + DOE  + Valvular Problems/Murmurs AS  Rhythm:Regular + Systolic murmurs 1. The aortic valve is abnormal. Aortic valve regurgitation is mild to  moderate. Severe aortic valve stenosis. Aortic valve area, by VTI measures  0.40 cm. Aortic valve mean gradient measures 53.0 mmHg. Aortic valve Vmax  measures 4.44 m/s.  2. Left ventricular ejection fraction, by estimation, is 20 to 25%. The  left ventricle has severely decreased function. The left ventricle  demonstrates global hypokinesis. The left ventricular internal cavity size  was mildly dilated. There is mild left  ventricular hypertrophy.  3. Right ventricular systolic function mild-moderately reduced. The right  ventricular size is mildly enlarged.  4. Left atrial size was mildly dilated. No left atrial/left atrial  appendage thrombus was detected.  5. Biatrial size was mildly dilated.  6. The mitral valve is degenerative. Moderate mitral valve regurgitation.  No evidence of mitral stenosis.  7. There is Moderate (Grade III) atheroma plaque involving the transverse  and descending aorta.  8. Large pleural effusion.    Neuro/Psych negative neurological ROS  negative psych ROS   GI/Hepatic negative GI ROS, Neg liver ROS,   Endo/Other  negative endocrine ROS  Renal/GU negative Renal ROS     Musculoskeletal   Abdominal   Peds  Hematology negative  hematology ROS (+)   Anesthesia Other Findings   Reproductive/Obstetrics                            Anesthesia Physical Anesthesia Plan  ASA: IV  Anesthesia Plan: MAC   Post-op Pain Management:    Induction: Intravenous  PONV Risk Score and Plan: 1 and Treatment may vary due to age or medical condition and Propofol infusion  Airway Management Planned: Nasal Cannula  Additional Equipment: Arterial line  Intra-op Plan:   Post-operative Plan:   Informed Consent: I have reviewed the patients History and Physical, chart, labs and discussed the procedure including the risks, benefits and alternatives for the proposed anesthesia with the patient or authorized representative who has indicated his/her understanding and acceptance.     Dental advisory given  Plan Discussed with: CRNA and Surgeon  Anesthesia Plan Comments: (PAT note written 03/13/2020 by Myra Gianotti, PA-C. )       Anesthesia Quick Evaluation

## 2020-03-13 NOTE — Progress Notes (Signed)
Anesthesia Chart Review:  Case: [id number] Date/Time: 03/14/20 0900   Procedures:      TRANSCATHETER AORTIC VALVE REPLACEMENT, TRANSFEMORAL (N/A Chest)     TRANSESOPHAGEAL ECHOCARDIOGRAM (TEE) (N/A )   Anesthesia type: General   Pre-op diagnosis: Severe Aortic Stenosis   Location: [facility] CATH LAB 6 / [place] INVASIVE CV LAB   Providers: Burnell Blanks, MD    CT Surgeon: Gilford Raid, MD  DISCUSSION: Patient is a 79 year old male scheduled for the above procedure.    History includes never smoker, murmur/severe AS, non-ischemic cardiomyopathy, chronic systolic and diastolic CHF, HTN, HLD, dyspnea, tinnitus, hard of hearing, hyperglycemia. + SARS-CoV2 RNA PCR test on 08/18/19.  He has known moderate bilateral pleural effusions. According to 03/01/20 CT consult by Dr. Cyndia Bent, "CTA of the chest shows moderate bilateral pleural effusions with bilateral lower lobe atelectasis.  He would benefit from having these drained at some point but I think I would wait until he has his valve fixed in case he has a vagal reaction and hypotension which could be catastrophic with severe aortic stenosis.  He said that he can lay flat without any difficulty at this time." BNP is > 3500. He is on daily Lasix.   03/11/2020 presurgical COVID-19 test negative.  Anesthesia team to evaluate on the day of surgery.   VS: BP (!) 139/91   Pulse 67   Temp 36.7 C (Oral)   Resp 18   Ht _0  (1.778 m)   Wt 58.5 kg   SpO2 100%   BMI 18.52 kg/m    PROVIDERS: Wile, Jesse Sans, MD is PCP [facility]) Glenetta Hew, MD is cardiologist   LABS: Preoperative labs noted.  (all labs ordered are listed, but only abnormal results are displayed)  Labs Reviewed  BLOOD GAS, ARTERIAL - Abnormal; Notable for the following components:      Result Value   Allens test (pass/fail) BRACHIAL ARTERY (*)    All other components within normal limits  BRAIN NATRIURETIC PEPTIDE - Abnormal; Notable for the following  components:   B Natriuretic Peptide 3,572.8 (*)    All other components within normal limits  CBC - Abnormal; Notable for the following components:   Hemoglobin 12.5 (*)    All other components within normal limits  COMPREHENSIVE METABOLIC PANEL - Abnormal; Notable for the following components:   Glucose, Bld 107 (*)    Total Protein 6.4 (*)    Albumin 3.4 (*)    All other components within normal limits  HEMOGLOBIN A1C - Abnormal; Notable for the following components:   Hgb A1c MFr Bld 6.0 (*)    All other components within normal limits  URINALYSIS, ROUTINE W REFLEX MICROSCOPIC - Abnormal; Notable for the following components:   Color, Urine STRAW (*)    All other components within normal limits  SURGICAL PCR SCREEN  APTT  PROTIME-INR  TYPE AND SCREEN     IMAGES: CXR 03/10/20: FINDINGS: Mild cardiomegaly, stable. Atherosclerotic calcification of the aortic knob. Moderate bilateral pleural effusions with associated bibasilar atelectasis, not appreciably changed from prior. No pneumothorax. No acute osseous findings. IMPRESSION: Stable moderate bilateral pleural effusions with associated bibasilar atelectasis.   EKG: 03/10/20:  Sinus rhythm with 1st degree A-V block Left axis deviation Left bundle branch block Abnormal ECG No old tracing to compare Confirmed by [place], Peter [phone]) on 03/10/2020 4:04:06 PM   CV: Carotid US 03/10/20: Summary:  - Right Carotid: Velocities in the right ICA are consistent with a 1-39%  stenosis.  - Left Carotid: Velocities in the left ICA are consistent with a 1-39%  stenosis.  - Vertebrals: Bilateral vertebral arteries demonstrate antegrade flow.    CT Coronary 02/23/20: IMPRESSION: 1. Trileaflet aortic valve with severely calcified leaflets and severely restricted leaflet opening but only minimal calcifications extending into the LVOT. Aortic valve calcium score 4399 consistent with severe aortic stenosis. Annular measurements  suitable for delivery of a 26 mm Edwards-SAPIEN 3 valve. 2. Sufficient coronary to annulus distance. 3. Optimum Fluoroscopic Angle for Delivery:  LAO 11 CAU 14. 4. No thrombus in the left atrial appendage.   Cardiac cath 02/16/20:  Hemodynamic findings consistent with moderate pulmonary hypertension.  There is severe aortic valve stenosis.  There is severe left ventricular systolic dysfunction.  The left ventricular ejection fraction is 25-35% by visual estimate.  Severe Aortic Stenosis  Severe Valvular Cardiomyopathy - EF ~25% & CO/CI: 3.25/1.82  Moderate to Severe Pulmonary Hypertension  Angiographically Normal Coronary Arteries   TEE 02/16/20: IMPRESSIONS  1. The aortic valve is abnormal. Aortic valve regurgitation is mild to  moderate. Severe aortic valve stenosis. Aortic valve area, by VTI measures  0.40 cm. Aortic valve mean gradient measures 53.0 mmHg. Aortic valve Vmax  measures 4.44 m/s.  2. Left ventricular ejection fraction, by estimation, is 20 to 25%. The  left ventricle has severely decreased function. The left ventricle  demonstrates global hypokinesis. The left ventricular internal cavity size  was mildly dilated. There is mild left  ventricular hypertrophy.  3. Right ventricular systolic function mild-moderately reduced. The right  ventricular size is mildly enlarged.  4. Left atrial size was mildly dilated. No left atrial/left atrial  appendage thrombus was detected.  5. Biatrial size was mildly dilated.  6. The mitral valve is degenerative. Moderate mitral valve regurgitation.  No evidence of mitral stenosis.  7. There is Moderate (Grade III) atheroma plaque involving the transverse  and descending aorta.  8. Large pleural effusion.    TTE 02/08/20: IMPRESSIONS  1. Left ventricular ejection fraction, by estimation, is 25 to 30%. The  left ventricle has severely decreased function. The left ventricle  demonstrates global hypokinesis.  Left ventricular diastolic function could  not be evaluated. The average left  ventricular global longitudinal strain is -5.7 %. The global longitudinal  strain is normal.  2. Right ventricular systolic function is normal. The right ventricular  size is normal. There is moderately elevated pulmonary artery systolic  pressure. The estimated right ventricular systolic pressure is 90.2 mmHg.  3. Left atrial size was mildly dilated.  4. The pericardial effusion is circumferential. Moderate pleural effusion  in the left lateral region.  5. The mitral valve is degenerative. Mild mitral valve regurgitation. No  evidence of mitral stenosis.  6. The aortic valve is tricuspid. Aortic valve regurgitation is mild to  moderate. Severe aortic valve stenosis. Aortic regurgitation PHT measures  465 msec. Aortic valve area, by VTI measures 0.49 cm. Aortic valve mean  gradient measures 41.0 mmHg.  Aortic valve Vmax measures 3.96 m/s.  7. Aortic dilatation noted. There is mild dilatation of the ascending  aorta measuring 39 mm.  8. The inferior vena cava is normal in size with greater than 50%  respiratory variability, suggesting right atrial pressure of 3 mmHg.  - Comparison(s): 03/20/17 Moderate-severe AS 29mHg peak. Compared to echo  2017, LVF has significantly declined and aortic stenosis has progressed.    Past Medical History:  Diagnosis Date  . 2019 novel coronavirus disease (COVID-19) 08/12/2019   -  Follow-up test on August 25, 2019 not detected.  Again not detected  both July 13 and 26, 2021  . Aortic stenosis 03/20/2017   Mild LVH.  EF~50%.  Severely calcified aortic valve-moderate AS (AoV velocity 4.4 m/s, AVA~0.6 cm; with mild AI.  MAC with mild MR.  Mild pulmonary hypertension but normal RV size and function.  . CHF (congestive heart failure) ([place])   . Disequilibrium   . Dyspnea   . Elevated blood pressure reading without diagnosis of hypertension 05/03/2008   Qualifier:  Diagnosis of  By: Larose Kells MD, [place] Heart murmur   . HOH (hard of hearing)   . Hyperglycemia 11/09/2012  . HYPERLIPIDEMIA, MILD 04/02/2010   Qualifier: Diagnosis of  By: Larose Kells MD, [place] cardiomyopathy ([place])   . Nuclear cataract 2018   Had surgery  . 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 Sensorineural hearing loss, bilateral 2016   Strict hearing loss precautions    Past Surgical History:  Procedure Laterality Date  . BUBBLE STUDY  02/16/2020   Procedure: BUBBLE STUDY;  Surgeon: Elouise Munroe, MD;  Location: [facility];  Service: Cardiology;;  . CARDIAC CATHETERIZATION  02/16/2020  . Cataract surgery  2018   Dr. Calvert Cantor  . EYE SURGERY Bilateral    cataract  . RIGHT/LEFT HEART CATH AND CORONARY ANGIOGRAPHY N/A 02/16/2020   Procedure: RIGHT/LEFT HEART CATH AND CORONARY ANGIOGRAPHY;  Surgeon: Leonie Man, MD;  Location: [facility] CV LAB;  Service: Cardiovascular;  Laterality: N/A;  . TEE WITHOUT CARDIOVERSION N/A 02/16/2020   Procedure: TRANSESOPHAGEAL ECHOCARDIOGRAM (TEE);  Surgeon: Elouise Munroe, MD;  Location: [facility];  Service: Cardiology;  Laterality: N/A;  . TONSILLECTOMY     age 8  . TRANSTHORACIC ECHOCARDIOGRAM  03/2017   [facility]): Mild LVH with normal LV contraction (LVEF 50%). Severely calcified aortic valve with mild regurgitation and moderate-severe stenosis (AoV velocity 4.4 m/s, AVA 0.6 cm^2). Mitral calcification with mild MR. Mild pulmonary hypertension. Normal RV size and function.  . TRANSTHORACIC ECHOCARDIOGRAM  07/19/2015    Normal LV size with mild LVH. LVEF 55-60% with grade 2 diastolic dysfunction. Moderate AS and mild AI. MAC with mild MR. Mild LA enlargement. Normal RV size and function.  . TRANSTHORACIC ECHOCARDIOGRAM  02/11/2020   EF 25-30% with severely reduced function global).  Moderately elevated PA pressures (~53 mmHg).  Severe AS with mild to moderate AI (AVA by VTI ~0.49 cm, mean gradient 41 mmHg. V  max 3.81ms; AI py PHT 465 msec)    MEDICATIONS: . aspirin EC 81 MG tablet  . carvedilol (COREG) 3.125 MG tablet  . furosemide (LASIX) 20 MG tablet  . losartan (COZAAR) 25 MG tablet   . sodium chloride flush (NS) 0.9 % injection 3 mL    AMyra Gianotti PA-C Surgical Short Stay/Anesthesiology MUrop[facility]ners Surgery Center LLCPhone ([phone] Endoscopy Center LLCPhone [phone]/13/2021 9:51 AM

## 2020-03-13 NOTE — Progress Notes (Signed)
Notified to arrive at [facility]

## 2020-03-13 NOTE — H&P (Signed)
[address]       [name] [address]             [phone]      Cardiothoracic Surgery Admission History and Physical   Referring Provider is Cesar Man, MD  Primary Cardiologist is Cesar Hew, MD  PCP is Cesar Lefevre Jesse Sans, MD      Chief Complaint  Patient presents with  . Aortic Stenosis       [name] with a history of hyperlipidemia and hypertension who is followed by Cesar Bullock in the past but he had not been back to see him for least 3 years. He now presents with at least a 36-monthhistory of progressive exertional shortness of breath and fatigue. He also developed lower extremity edema. He denies orthopnea and PND. Has had no dizziness or syncope. He reports having an episode last week where he had left-sided chest discomfort all day long which subsequently resolved in the evening. He was seen by Cesar Bullock underwent an echocardiogram showing a trileaflet aortic valve with mild to moderate aortic insufficiency with a pressure half-time of 465 ms and severe aortic stenosis with a mean gradient of 41 mmHg. There is mild mitral regurgitation. Left ventricular ejection fraction was severely reduced to 25 to 30% with an LV internal dimension of 5.3 cm during diastole and 4.3 cm during systole. [facility] showed severe aortic stenosis with a calcified aortic valve and a peak velocity of 4.4 m/s. The peak gradient was 78.2 mmHg with no mean gradient recorded. Aortic valve area was 0.56 cm. Left ventricular ejection fraction at that time was 50% with mild LVH. There is mild MR. He reports being asymptomatic at that time but did have an elevated BNP. He saw Cesar Bullock on 03/24/2017 and plans are made for stress testing which apparently was not performed. He did not return for follow-up and when he began having symptoms a couple months ago he tried to get into see Dr. PLarose Bullock but could not get an appointment since he was a new patient and Cesar [facility] not taking new patients.  He is here today by himself. He continues to work for [employer] [place] called [employer] [facility] [name]. He has 2 children and 6 grandchildren. He is a non-smoker and quit drinking alcohol and 08/2019. He was drinking 1 drink per day prior to that.  He says that his shortness of breath is now occurring with any physical activity and sometimes at rest he feels short of breath. He has to take frequent rest breaks with ambulation. He feels like his appetite has been normal although his weight was in the 140s a year ago and now 120s.      Past Medical History:  Diagnosis Date  . 2019 novel coronavirus disease (COVID-19) 08/12/2019   -Follow-up test on August 25, 2019 not detected. Again not detected both July 13 and 26, 2021  . Aortic stenosis 03/20/2017   Mild LVH. EF~50%. Severely calcified aortic valve-moderate AS (AoV velocity 4.4 m/s, AVA~0.6 cm; with mild AI. MAC with mild MR. Mild pulmonary hypertension but normal RV size and function.  . Disequilibrium   . Elevated blood pressure reading without diagnosis of hypertension 05/03/2008   Qualifier: Diagnosis of By: PLarose KellsMD, JMonturaHyperglycemia 11/09/2012  . HYPERLIPIDEMIA, MILD  04/02/2010   Qualifier: Diagnosis of By: Larose Kells MD, Alda Berthold Nuclear cataract 2018   Had surgery  . Sensorineural hearing loss, bilateral 2016   Strict hearing loss precautions        Past Surgical History:  Procedure Laterality Date  . BUBBLE STUDY  02/16/2020   Procedure: BUBBLE STUDY; Surgeon: Elouise Munroe, MD; Location: [facility] ENDOSCOPY; Service: Cardiology;;  . Cataract surgery  2018   Dr. Calvert Cantor  . RIGHT/LEFT HEART CATH AND CORONARY ANGIOGRAPHY N/A 02/16/2020   Procedure: RIGHT/LEFT HEART CATH AND CORONARY ANGIOGRAPHY; Surgeon: Cesar Man, MD; Location: [place] CV LAB; Service: Cardiovascular; Laterality: N/A;  .  TEE WITHOUT CARDIOVERSION N/A 02/16/2020   Procedure: TRANSESOPHAGEAL ECHOCARDIOGRAM (TEE); Surgeon: Elouise Munroe, MD; Location: [place]; Service: Cardiology; Laterality: N/A;  . TONSILLECTOMY     age 63  . TRANSTHORACIC ECHOCARDIOGRAM  03/2017   [facility]): Mild LVH with normal LV contraction (LVEF 50%). Severely calcified aortic valve with mild regurgitation and moderate-severe stenosis (AoV velocity 4.4 m/s, AVA 0.6 cm^2). Mitral calcification with mild MR. Mild pulmonary hypertension. Normal RV size and function.  . TRANSTHORACIC ECHOCARDIOGRAM  07/19/2015   Normal LV size with mild LVH. LVEF 55-60% with grade 2 diastolic dysfunction. Moderate AS and mild AI. MAC with mild MR. Mild LA enlargement. Normal RV size and function.  . TRANSTHORACIC ECHOCARDIOGRAM  02/11/2020   EF 25-30% with severely reduced function global). Moderately elevated PA pressures (~53 mmHg). Severe AS with mild to moderate AI (AVA by VTI ~0.49 cm, mean gradient 41 mmHg. V max 3.6ms; AI py PHT 465 msec)        Family History  Problem Relation Age of Onset  . Diabetes Other    GM  . CAD Brother 527  PCI at age 77   . Stroke Mother 753  Had second stroke at age 77 died age 77 . Cancer Father 337  Reportedly sinus cancer  . Colon cancer Neg Hx   . Prostate cancer Neg Hx    Social History        Socioeconomic History  . Marital status: Single    Spouse name: Not on file  . Number of children: 2  . Years of education: Not on file  . Highest education level: Not on file  Occupational History  . Occupation: mPsychologist, educational works full time    [place]  . Smoking status: Never Smoker  . Smokeless tobacco: Never Used  Vaping Use  . Vaping Use: Never used  Substance and Sexual Activity  . Alcohol use: Not Currently    Alcohol/week: 6.0 standard drinks    Types: 6 Shots of liquor per week    Comment: vodka when he stopped drinking roughly 3 months ago  _0  none none none  . Drug use: No  . Sexual activity: Not on file  Other Topics Concern  . Not on file  Social History Narrative   Lives by himself   6 g-kids      Former [place] [place] [employer]      Exercise-minimal   Quit drinking alcohol in March 2021   Social Determinants of Health      Financial Resource Strain:   . Difficulty of Paying Living Expenses: Not on file  Food Insecurity:   . Worried About RCharity fundraiserin the Last Year: Not on file  . Ran Out  of Food in the Last Year: Not on file  Transportation Needs:   . Lack of Transportation (Medical): Not on file  . Lack of Transportation (Non-Medical): Not on file  Physical Activity:   . Days of Exercise per Week: Not on file  . Minutes of Exercise per Session: Not on file  Stress:   . Feeling of Stress : Not on file  Social Connections:   . Frequency of Communication with Friends and Family: Not on file  . Frequency of Social Gatherings with Friends and Family: Not on file  . Attends Religious Services: Not on file  . Active Member of Clubs or Organizations: Not on file  . Attends Archivist Meetings: Not on file  . Marital Status: Not on file  Intimate Partner Violence:   . Fear of Current or Ex-Partner: Not on file  . Emotionally Abused: Not on file  . Physically Abused: Not on file  . Sexually Abused: Not on file         Current Outpatient Medications  Medication Sig Dispense Refill  . aspirin EC 81 MG tablet Take 81 mg by mouth daily. Swallow whole.    . carvedilol (COREG) 3.125 MG tablet Take 1 tablet (3.125 mg total) by mouth 2 (two) times daily. 180 tablet 3  . furosemide (LASIX) 20 MG tablet Take 1 tablet (20 mg total) by mouth daily. (Patient taking differently: Take 20-40 mg by mouth daily. ) 90 tablet 3  . losartan (COZAAR) 25 MG tablet Take 1 tablet (25 mg total) by mouth daily. 90 tablet 3            Current Facility-Administered Medications  Medication  Dose Route Frequency Provider Last Rate Last Admin  . sodium chloride flush (NS) 0.9 % injection 3 mL 3 mL Intravenous Q12H Cesar Man, MD     No Known Allergies  Review of Systems:  General: normal appetite, + decreased energy, no weight gain, + weight loss, no fever  Cardiac: no chest pain with exertion, no chest pain at rest, +SOB with any exertion, + resting SOB, no PND, no orthopnea, no palpitations, no arrhythmia, no atrial fibrillation, + LE edema, no dizzy spells, no syncope  Respiratory: + shortness of breath, no home oxygen, no productive cough, no dry cough, no bronchitis, no wheezing, no hemoptysis, no asthma, no pain with inspiration or cough, no sleep apnea, no CPAP at night  GI: no difficulty swallowing, no reflux, no frequent heartburn, no hiatal hernia, no abdominal pain, no constipation, no diarrhea, no hematochezia, no hematemesis, no melena  GU: no dysuria, no frequency, no urinary tract infection, no hematuria, no enlarged prostate, no kidney stones, no kidney disease  Vascular: no pain suggestive of claudication, no pain in feet, no leg cramps, no varicose veins, no DVT, no non-healing foot ulcer  Neuro: no stroke, no TIA's, no seizures, no headaches, no temporary blindness one eye, no slurred speech, no peripheral neuropathy, no chronic pain, no instability of gait, no memory/cognitive dysfunction  Musculoskeletal: no arthritis, no joint swelling, no myalgias, no difficulty walking, normal mobility  Skin: no rash, no itching, no skin infections, no pressure sores or ulcerations  Psych: no anxiety, no depression, no nervousness, no unusual recent stress  Eyes: no blurry vision, no floaters, no recent vision changes, does not wear glasses or contacts  ENT: + hearing loss, no loose or painful teeth, no dentures, last saw dentist every 6 months  Hematologic: no easy bruising, no abnormal bleeding, no  clotting disorder, no frequent epistaxis  Endocrine: no diabetes, does not  check CBG's at home  Physical Exam:  BP 125/85  Pulse 90  Temp 97.6 F (36.4 C) (Skin)  Resp 20  Ht _0  (1.778 m)  Wt 130 lb (59 kg)  SpO2 97% Comment: RA  BMI 18.65 kg/m  General: Thin, well-appearing  HEENT: Unremarkable, NCAT, PERLA, EOMI  Neck: no JVD, no bruits, no adenopathy  Chest: clear to auscultation, decreased breath sounds in both bases, no wheezes, no rhonchi  CV: RRR, grade lll/VI crescendo/decrescendo murmur heard best at RSB, no diastolic murmur  Abdomen: soft, non-tender, no masses  Extremities: warm, well-perfused, pulses palpable at ankle, mild LE edema  Rectal/GU Deferred  Neuro: Grossly non-focal and symmetrical throughout  Skin: Clean and dry, no rashes, no breakdown  Diagnostic Tests:  ECHOCARDIOGRAM REPORT     Patient Name: Cesar Bullock Date of Exam: 02/08/2020  Medical Rec #: [id number] Height: 70.0 in  Accession #: [id number] Weight: 147.2 lb  Date of Birth: 1940-11-13 BSA: 1.832 m  Patient Age: 81 years BP: 157/99 mmHg  Patient Gender: M HR: 97 bpm.  Exam Location: [address]   Procedure: 2D Echo, 3D Echo, Cardiac Doppler, Color Doppler and Strain  Analysis   Indications: I35 Aortic stenosis   History: Patient has prior history of Echocardiogram examinations,  most  recent 03/20/2017. Aortic Valve Disease,  Signs/Symptoms:Shortness  of Breath and Murmur; Risk Factors:Dyslipidemia. LE edema.   Sonographer: Jessee Avers, RDCS  Referring Phys: [place]    1. Left ventricular ejection fraction, by estimation, is 25 to 30%. The  left ventricle has severely decreased function. The left ventricle  demonstrates global hypokinesis. Left ventricular diastolic function could  not be evaluated. The average left  ventricular global longitudinal strain is -5.7 %. The global longitudinal  strain is normal.  2. Right ventricular systolic function is normal. The right ventricular  size is normal. There is moderately  elevated pulmonary artery systolic  pressure. The estimated right ventricular systolic pressure is 30.0 mmHg.  3. Left atrial size was mildly dilated.  4. The pericardial effusion is circumferential. Moderate pleural effusion  in the left lateral region.  5. The mitral valve is degenerative. Mild mitral valve regurgitation. No  evidence of mitral stenosis.  6. The aortic valve is tricuspid. Aortic valve regurgitation is mild to  moderate. Severe aortic valve stenosis. Aortic regurgitation PHT measures  465 msec. Aortic valve area, by VTI measures 0.49 cm. Aortic valve mean  gradient measures 41.0 mmHg.  Aortic valve Vmax measures 3.96 m/s.  7. Aortic dilatation noted. There is mild dilatation of the ascending  aorta measuring 39 mm.  8. The inferior vena cava is normal in size with greater than 50%  respiratory variability, suggesting right atrial pressure of 3 mmHg.   Comparison(s): 03/20/17 Moderate-severe AS 9mHg peak. Compared to echo  2017, LVF has significantly declined and aortic stenosis has progressed.   FINDINGS  Left Ventricle: Left ventricular ejection fraction, by estimation, is 25  to 30%. The left ventricle has severely decreased function. The left  ventricle demonstrates global hypokinesis. The average left ventricular  global longitudinal strain is -5.7 %.  The global longitudinal strain is normal. The left ventricular internal  cavity size was normal in size. There is no left ventricular hypertrophy.  Left ventricular diastolic function could not be evaluated.   Right Ventricle: The right ventricular size is normal. No increase in  right ventricular wall thickness. Right ventricular systolic function is  normal. There is moderately elevated pulmonary artery systolic pressure.  The tricuspid regurgitant velocity is  3.53 m/s, and with an assumed right atrial pressure of 3 mmHg, the  estimated right ventricular systolic pressure is 82.5 mmHg.   Left Atrium: Left  atrial size was mildly dilated.   Right Atrium: Right atrial size was normal in size.   Pericardium: A small pericardial effusion is present. The pericardial  effusion is circumferential.   Mitral Valve: The mitral valve is degenerative in appearance. There is  severe thickening of the mitral valve leaflet(s). There is mild  calcification of the mitral valve leaflet(s). Normal mobility of the  mitral valve leaflets. Severe mitral annular  calcification. Mild mitral valve regurgitation. No evidence of mitral  valve stenosis.   Tricuspid Valve: The tricuspid valve is normal in structure. Tricuspid  valve regurgitation is mild . No evidence of tricuspid stenosis.   Aortic Valve: The aortic valve is tricuspid. . There is severe thickening  and severe calcifcation of the aortic valve. Aortic valve regurgitation is  mild to moderate. Aortic regurgitation PHT measures 465 msec. Severe  aortic stenosis is present. There is  severe thickening of the aortic valve. There is severe calcifcation of  the aortic valve. Aortic valve mean gradient measures 41.0 mmHg. Aortic  valve peak gradient measures 62.6 mmHg. Aortic valve area, by VTI measures  0.49 cm.   Pulmonic Valve: The pulmonic valve was normal in structure. Pulmonic valve  regurgitation is mild. No evidence of pulmonic stenosis.   Aorta: Aortic dilatation noted. There is mild dilatation of the ascending  aorta measuring 39 mm.   Venous: The inferior vena cava is normal in size with greater than 50%  respiratory variability, suggesting right atrial pressure of 3 mmHg.   IAS/Shunts: No atrial level shunt detected by color flow Doppler.   Additional Comments: There is a moderate pleural effusion in the left  lateral region.    LEFT VENTRICLE  PLAX 2D  LVIDd: 5.30 cm  LVIDs: 4.30 cm 2D Longitudinal Strain  LV PW: 1.10 cm 2D Strain GLS (A2C): -6.5 %  LV IVS: 1.10 cm 2D Strain GLS (A3C): -3.3 %  LVOT diam: 2.20 cm 2D Strain GLS  (A4C): -7.3 %  LV SV: 45 2D Strain GLS Avg: -5.7 %  LV SV Index: 24  LVOT Area: 3.80 cm   3D Volume EF:  3D EF: 29 %  LV EDV: 150 ml  LV ESV: 106 ml  LV SV: 44 ml   RIGHT VENTRICLE  RVSP: 57.8 mmHg   LEFT ATRIUM Index RIGHT ATRIUM  LA diam: 4.50 cm 2.46 cm/m RA Pressure: 8.00 mmHg  LA Vol (A2C): 67.3 ml 36.73 ml/m  LA Vol (A4C): 71.5 ml 39.02 ml/m  LA Biplane Vol: 70.2 ml 38.31 ml/m  AORTIC VALVE  AV Area (Vmax): 0.50 cm  AV Area (Vmean): 0.46 cm  AV Area (VTI): 0.49 cm  AV Vmax: 395.50 cm/s  AV Vmean: 306.000 cm/s  AV VTI: 0.918 m  AV Peak Grad: 62.6 mmHg  AV Mean Grad: 41.0 mmHg  LVOT Vmax: 51.70 cm/s  LVOT Vmean: 37.150 cm/s  LVOT VTI: 0.118 m  LVOT/AV VTI ratio: 0.13  AI PHT: 465 msec   AORTA  Ao Root diam: 3.10 cm  Ao Asc diam: 3.70 cm   MITRAL VALVE TRICUSPID VALVE  TR Peak grad: 49.8 mmHg  TR Vmax: 353.00 cm/s  MR Peak grad: 105.3 mmHg Estimated  RAP: 8.00 mmHg  MR Mean grad: 63.0 mmHg RVSP: 57.8 mmHg  MR Vmax: 513.00 cm/s  MR Vmean: 373.0 cm/s SHUNTS  MR PISA: 2.26 cm Systemic VTI: 0.12 m  MR PISA Eff ROA: 18 mm Systemic Diam: 2.20 cm  MR PISA Radius: 0.60 cm  MV E velocity: 113.00 cm/s  MV A velocity: 57.80 cm/s  MV E/A ratio: 1.96   Fransico Him MD  Electronically signed by Fransico Him MD  Signature Date/Time: 02/08/2020/11:23:03 AM  TRANSESOPHOGEAL ECHO REPORT     Patient Name: Cesar Bullock Date of Exam: 02/16/2020  Medical Rec #: [id number] Height: 70.0 in  Accession #: [id number] Weight: 138.2 lb  Date of Birth: [birth year] BSA: 1.784 m  Patient Age: 64 years BP: 146/91 mmHg  Patient Gender: M HR: 68 bpm.  Exam Location: Inpatient   Procedure: Transesophageal Echo, 3D Echo, Color Doppler, Cardiac Doppler  and  Saline Contrast Bubble Study   Indications: Aortic Stenosis i35.0   History: Patient has prior history of Echocardiogram examinations,  most  recent 02/08/2020. Risk Factors:Dyslipidemia and COVID  02/21.    Sonographer: Raquel Sarna Senior RDCS  Referring Phys: [id number] Elouise Munroe  Diagnosing Phys: Cherlynn Kaiser MD   PROCEDURE: After discussion of the risks and benefits of a TEE, an  informed consent was obtained from the patient. TEE procedure time was 39  minutes. The transesophogeal probe was passed without difficulty through  the esophogus of the patient. Imaged  were obtained with the patient in a left lateral decubitus position. Local  oropharyngeal anesthetic was provided with Cetacaine. Sedation performed  by different physician. The patient was monitored while under deep  sedation. Anesthestetic sedation was  provided intravenously by Anesthesiology: 128m of Propofol. Image quality  was excellent. The patient's vital signs; including heart rate, blood  pressure, and oxygen saturation; remained stable throughout the procedure.  The patient developed no  complications during the procedure.   IMPRESSIONS    1. The aortic valve is abnormal. Aortic valve regurgitation is mild to  moderate. Severe aortic valve stenosis. Aortic valve area, by VTI measures  0.40 cm. Aortic valve mean gradient measures 53.0 mmHg. Aortic valve Vmax  measures 4.44 m/s.  2. Left ventricular ejection fraction, by estimation, is 20 to 25%. The  left ventricle has severely decreased function. The left ventricle  demonstrates global hypokinesis. The left ventricular internal cavity size  was mildly dilated. There is mild left  ventricular hypertrophy.  3. Right ventricular systolic function mild-moderately reduced. The right  ventricular size is mildly enlarged.  4. Left atrial size was mildly dilated. No left atrial/left atrial  appendage thrombus was detected.  5. Biatrial size was mildly dilated.  6. The mitral valve is degenerative. Moderate mitral valve regurgitation.  No evidence of mitral stenosis.  7. There is Moderate (Grade III) atheroma plaque involving the transverse  and descending  aorta.  8. Large pleural effusion.   FINDINGS  Left Ventricle: Left ventricular ejection fraction, by estimation, is 20  to 25%. The left ventricle has severely decreased function. The left  ventricle demonstrates global hypokinesis. The left ventricular internal  cavity size was mildly dilated. There  is mild left ventricular hypertrophy.   Right Ventricle: The right ventricular size is mildly enlarged. No  increase in right ventricular wall thickness. Right ventricular systolic  function mild-moderately reduced.   Left Atrium: Left atrial size was mildly dilated. No left atrial/left  atrial appendage thrombus was detected.   Right Atrium: Right atrial size  was mildly dilated.   Pericardium: A small pericardial effusion is present.   Mitral Valve: The mitral valve is degenerative in appearance. Moderate  mitral valve regurgitation. No evidence of mitral valve stenosis.   Tricuspid Valve: The tricuspid valve is normal in structure. Tricuspid  valve regurgitation is mild.   Aortic Valve: Unable to obtain accurate vena contracta due to shadowing  from calcium. The aortic valve is abnormal. Aortic valve regurgitation is  mild to moderate. Aortic regurgitation PHT measures 479 msec. Severe  aortic stenosis is present. Aortic  valve mean gradient measures 53.0 mmHg. Aortic valve peak gradient  measures 78.9 mmHg. Aortic valve area, by VTI measures 0.40 cm.   Pulmonic Valve: The pulmonic valve was normal in structure. Pulmonic valve  regurgitation is mild.   Aorta: The aortic root and ascending aorta are structurally normal, with  no evidence of dilitation. There is moderate (Grade III) atheroma plaque  involving the transverse and descending aorta.   IAS/Shunts: No atrial level shunt detected by color flow Doppler. Agitated  saline contrast was given intravenously to evaluate for intracardiac  shunting.   Additional Comments: There is a large pleural effusion.    LEFT  VENTRICLE  PLAX 2D  LVOT diam: 2.20 cm  LV SV: 47  LV SV Index: 26  LVOT Area: 3.80 cm    AORTIC VALVE  AV Area (Vmax): 0.35 cm  AV Area (Vmean): 0.33 cm  AV Area (VTI): 0.40 cm  AV Vmax: 444.00 cm/s  AV Vmean: 346.000 cm/s  AV VTI: 1.160 m  AV Peak Grad: 78.9 mmHg  AV Mean Grad: 53.0 mmHg  LVOT Vmax: 41.00 cm/s  LVOT Vmean: 29.600 cm/s  LVOT VTI: 0.123 m  LVOT/AV VTI ratio: 0.11  AI PHT: 479 msec   MR Peak grad: 122.1 mmHg  MR Mean grad: 75.0 mmHg SHUNTS  MR Vmax: 552.50 cm/s Systemic VTI: 0.12 m  MR Vmean: 403.5 cm/s Systemic Diam: 2.20 cm  MR PISA Nyquist: 3.8 m/s  MR PISA: 2.04 cm  MR PISA Eff ROA: 14 mm  MR PISA Radius: 0.57 cm   Cherlynn Kaiser MD  Electronically signed by Cherlynn Kaiser MD  Signature Date/Time: 02/21/2020/3:09:05 PM  Physicians  Panel Physicians Referring Physician Case Authorizing Physician  Cesar Man, MD (Primary)    Procedures  RIGHT/LEFT HEART CATH AND CORONARY ANGIOGRAPHY  Conclusion  Hemodynamic findings consistent with moderate pulmonary hypertension.  There is severe aortic valve stenosis.  There is severe left ventricular systolic dysfunction.  The left ventricular ejection fraction is 25-35% by visual estimate. Severe Aortic Stenosis  Severe Valvular Cardiomyopathy - EF ~25% & CO/CI: 3.25/1.82  Moderate to Severe Pulmonary Hypertension  Angiographically Normal Coronary Arteries Recommendations: The patient be discharged home after bedrest. He will be scheduled to see Dr. Angelena Form or either Dr. Minna Merritts. Cyndia Bent in the TAVR clinic.  Cesar Hew, MD  Recommendations  Antiplatelet/Anticoag Recommend Aspirin 75m daily for moderate CAD.  Discharge Date In the absence of any other complications or medical issues, we expect the patient to be ready for discharge from a cath perspective on 02/16/2020. He will be scheduled for TAVR clinic consultation. Dr. MAngelena Formsaw the patient in the short stay to discuss the procedure.   Surgeon Notes    02/16/2020 9:59 AM CV Procedure signed by AElouise Munroe MD  Indications  Severe aortic stenosis [I35.0 (ICD-10-CM)]  Acute combined systolic and diastolic heart failure ([place] [I50.41 (ICD-10-CM)]  Valvular cardiomyopathy ([place] [I42.8 (ICD-10-CM)]  Procedural Details  Technical  Details Primary Care Provider: Michael Boston MD Cardiologist: Cesar Hew, MD  Atthew Coutant Baldinger is a 79 y.o. male who was seen in consultation initially for heart failure symptoms, he underwent echocardiogram showing severe aortic stenosis and severe cardiomyopathy. He was then seen for the close follow-up of now SEVERE (symptomatic) AORTIC STENOSIS, SHORTNESS OF BREATH, LEG SWELLING. He was started on low-dose Lasix and carvedilol with notable improvement in symptoms. He is lost over 15 pounds of diuresis.   Echo 02/08/2020: EF 25-30% with severely reduced function global). Moderately elevated PA pressures (~53 mmHg). Severe AS with mild to moderate AI (AVA by VTI ~0.49 cm, mean gradient 41 mmHg. V max 3.45ms; AI py PHT 465 msec). Mild MR. Mildly dilated left atrium. 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 Small circumferential pericardial effusion.   Based on results of the echocardiogram, he is now referred for right left heart catheterization.  Time Out: Verified patient identification, verified procedure, site/side was marked, verified correct patient position, special equipment/implants available, medications/allergies/relevent history reviewed, required imaging and test results available. Performed.  Access:  Right radial Artery: 6 Fr sheath -- Seldinger technique using Micropuncture Kit Direct ultrasound guidance used. Permanent image obtained and placed on chart. 10 mL radial cocktail IA; 4000 units IV Heparin * Right Brachial/Antecubital Vein: The existing 18-gauge IV was exchanged over a wire for a 5Fr sheath  Right Heart Catheterization: 5 Fr  SGordy Councilmancatheter advanced under fluoroscopy with balloon inflated to the RA, RV, then PCWP-PA for hemodynamic measurement. The venous tract was very tortuous. A 0.25 Glidewire was used to help advance the catheter. We initially got the catheter into the RV but it would not advance beyond the RV. With the wire in place, the catheter was removed completely and flushed. It was then successfully advanced into the PA and PCWP position. * Simultaneous FA & PA blood gases checked for SaO2% to calculate FICK CO/CI  * Catheter removed completely out of the body with balloon deflated.  CORONARY ANGIOGRAPHY (aortic valve not crossed): 5Fr TIG 4.0 catheters advanced or exchanged over a J-wire under direct fluoroscopic guidance into the ascending aorta and used to engage the left and right coronary arteries for cineangiography.  Upon completion of Angiogaphy, the catheter was removed completely out of the body over a wire, without complication.  Femoral / Brachial Sheath(s) removed in the Cath Lab with manual pressure for hemostasis.   Radial sheath removed in the Cardiac Catheterization lab with TR Band placed for hemostasis.  TR Band: 1410 Hours; 15 mL air  MEDICATIONS * SQ Lidocaine 373m* Radial Cocktail: 3 mg Verapmil in 10 mL NS * Isovue Contrast: 60 Ml * Heparin: 4000 Units Estimated blood loss <50 mL.   During this procedure medications were administered to achieve and maintain moderate conscious sedation while the patient's heart rate, blood pressure, and oxygen saturation were continuously monitored and I was present face-to-face 100% of this time.  Medications  (Filter: Administrations occurring from 1302 to 1409 on 02/16/20)  midazolam (VERSED) injection (mg)  Total dose: 2 mg  Date/Time  Rate/Dose/Volume Action  02/16/20 1306  2 mg Given  fentaNYL (SUBLIMAZE) injection (mcg)  Total dose: 25 mcg  Date/Time  Rate/Dose/Volume Action  02/16/20 1307  25 mcg Given  lidocaine (PF)  (XYLOCAINE) 1 % injection (mL)  Total volume: 4 mL  Date/Time  Rate/Dose/Volume Action  02/16/20 1309  2 mL Given  1311  2 mL Given  Radial Cocktail/Verapamil  only (mL)  Total volume: 10 mL  Date/Time  Rate/Dose/Volume Action  02/16/20 1311  10 mL Given  Heparin (Porcine) in NaCl 1000-0.9 UT/500ML-% SOLN (mL)  Total volume: 1,500 mL  Date/Time  Rate/Dose/Volume Action  02/16/20 1312  500 mL Given  1312  500 mL Given  1313  500 mL Given  heparin sodium (porcine) injection (Units)  Total dose: 4,000 Units  Date/Time  Rate/Dose/Volume Action  02/16/20 1353  4,000 Units Given  iohexol (OMNIPAQUE) 350 MG/ML injection (mL)  Total volume: 40 mL  Date/Time  Rate/Dose/Volume Action  02/16/20 1408  40 mL Given  Sedation Time  Sedation Time Physician-1: 55 minutes 8 seconds  Contrast  Medication Name Total Dose  iohexol (OMNIPAQUE) 350 MG/ML injection 40 mL  Radiation/Fluoro  Fluoro time: 17.5 (min)  DAP: 17013 (mGycm2)  Cumulative Air Kerma: 373 (mGy)  Complications  Complications documented before study signed (02/16/2020 4:28 PM)   No complications were associated with this study.  Documented by Cesar Man, MD - 02/16/2020 2:42 PM  Coronary Findings  Diagnostic  Dominance: Right  Left Main  Vessel was injected. Vessel is normal in caliber. Vessel is angiographically normal.  Left Anterior Descending  Vessel was injected. Vessel is normal in caliber. Vessel is angiographically normal.  Third Diagonal Branch  Vessel is small in size.  Left Circumflex  Vessel was injected. Vessel is normal in caliber and moderate in size. Vessel is angiographically normal.  First Obtuse Marginal Branch  Vessel is small in size.  Left Posterior Atrioventricular Artery  Vessel is small in size.  Right Coronary Artery  Vessel was injected. Vessel is normal in caliber and moderate in size. Vessel is angiographically normal.  Right Ventricular Branch  Vessel was injected. Vessel is small in  size. Vessel is angiographically normal.  Right Posterior Descending Artery  Vessel was injected. Vessel is small in size. Vessel is angiographically normal.  Right Posterior Atrioventricular Artery  Vessel was injected. Vessel is moderate in size. Vessel is angiographically normal.  First Right Posterolateral Branch  Vessel was injected. Vessel is small in size. Vessel is angiographically normal.  Second Right Posterolateral Branch  Vessel was injected. Vessel is small in size. Vessel is angiographically normal.  Intervention  No interventions have been documented.  Right Heart  Right Heart Pressures Hemodynamic findings consistent with moderate pulmonary hypertension. PAP-mean: 67/25 mmHg - 41 mmHg PCWP 27 mmHg Not assessed, elevated PCWP. Ao pressure 136/74 mmHg-MAP 99 mmHg Ao sat 95%, PA sat 56%: CARDIAC OUTPUT-INDEX (Fick): 3.25-122  Right Atrium The right atrium is moderately dilated. Right atrial pressure is normal. RAP mean 6 mmHg  Right Ventricle RVP-EDP: 67/4 mmHg - 9 mmHg  Wall Motion  Resting    No LV gram performed.    Left Heart  Left Ventricle There is severe left ventricular systolic dysfunction. Documented by echocardiogram and TEE The left ventricular ejection fraction is 25-35% by visual estimate.  Aortic Valve There is severe aortic valve stenosis. By TEE -> mean gradient 56 mmHg The aortic valve is calcified.  Coronary Diagrams  Diagnostic  Dominance: Right   Intervention  Implants     No implant documentation for this case.  Syngo Images  Show images for CARDIAC CATHETERIZATION  Images on Long Term Storage  Show images for Tweten, Irwin Toran "Ed" Link to Procedure Log    Procedure Log  Hemo Data   Most Recent Value  Fick Cardiac Output 3.25 L/min  Fick Cardiac Output Index 1.82 (L/min)/BSA  RA A Wave 9 mmHg  RA V Wave 6 mmHg  RA Mean 5 mmHg  RV Systolic Pressure 67 mmHg  RV Diastolic Pressure 4 mmHg  RV EDP 9 mmHg  PA Systolic Pressure 67 mmHg  PA  Diastolic Pressure 25 mmHg  PA Mean 41 mmHg  PW A Wave 27 mmHg  PW V Wave 30 mmHg  PW Mean 26 mmHg  AO Systolic Pressure 235 mmHg  AO Diastolic Pressure 74 mmHg  AO Mean 99 mmHg  QP/QS 1  TPVR Index 22.55 HRUI  TSVR Index 54.45 HRUI  PVR SVR Ratio 0.16  TPVR/TSVR Ratio 0.41   ADDENDUM REPORT: 02/24/2020 13:35  CLINICAL DATA: 79 year old male with severe aortic stenosis being  evaluated for a TAVR procedure vs AVR.  EXAM:  Cardiac TAVR CT  TECHNIQUE:  The patient was scanned on a [employer]. A 120 kV  retrospective scan was triggered in the descending thoracic aorta at  111 HU's. Gantry rotation speed was 250 msecs and collimation was .6  mm. Carvedilol 3.125 mg PO and no nitro were given. The 3D data set  was reconstructed in 5% intervals of the R-R cycle. Systolic and  diastolic phases were analyzed on a dedicated work station using  MPR, MIP and VRT modes. The patient received 80 cc of contrast.  FINDINGS:  Aortic Valve: Trileaflet aortic valve with severely calcified  leaflets and severely restricted leaflet opening but only minimal  calcifications extending into the LVOT. Aortic valve calcium score  4399 consistent with severe aortic stenosis.  Aorta: Normal size with mild diffuse atherosclerotic plaque and  calcifications. No dissection.  Sinotubular Junction: 29.0 x 27.9 mm  Ascending Thoracic Aorta: 36.6 x 36.6 mm  Aortic Arch: 23.6 x 23.1 mm  Descending Thoracic Aorta: 20.4 x 19.9 mm  Sinus of Valsalva Measurements:  Non-coronary: 32 mm  Right -coronary: 31 mm  Left -coronary: 32 mm  Coronary Artery Height above Annulus:  Left Main: 13 mm  Right Coronary: 19 mm  Virtual Basal Annulus Measurements:  Maximum/Minimum Diameter: 28.0 x 22.0 mm  Mean Diameter: 24.8 mm  Perimeter: 80.1 mm  Area: 483 mm2  Optimum Fluoroscopic Angle for Delivery: LAO 11 CAU 14  Mildly dilated pulmonary artery measuring 31 mm.  IMPRESSION:  1. Trileaflet aortic valve  with severely calcified leaflets and  severely restricted leaflet opening but only minimal calcifications  extending into the LVOT. Aortic valve calcium score 4399 consistent  with severe aortic stenosis. Annular measurements suitable for  delivery of a 26 mm Edwards-SAPIEN 3 valve.  2. Sufficient coronary to annulus distance.  3. Optimum Fluoroscopic Angle for Delivery: LAO 11 CAU 14.  4. No thrombus in the left atrial appendage.  Electronically Signed  By: Ena Dawley  On: 02/24/2020 13:35   Addended by Dorothy Spark, MD on 02/24/2020 1:38 PM  Study Result  Addenda  ADDENDUM REPORT: 02/24/2020 13:35  CLINICAL DATA: 79 year old male with severe aortic stenosis being  evaluated for a TAVR procedure vs AVR.  EXAM:  Cardiac TAVR CT  TECHNIQUE:  The patient was scanned on a [employer]. A 120 kV  retrospective scan was triggered in the descending thoracic aorta at  111 HU's. Gantry rotation speed was 250 msecs and collimation was .6  mm. Carvedilol 3.125 mg PO and no nitro were given. The 3D data set  was reconstructed in 5% intervals of the R-R cycle. Systolic and  diastolic phases were analyzed on a dedicated work station using  MPR, MIP and VRT modes. The patient received 80 cc of contrast.  FINDINGS:  Aortic Valve: Trileaflet aortic valve with severely calcified  leaflets and severely restricted leaflet opening but only minimal  calcifications extending into the LVOT. Aortic valve calcium score  4399 consistent with severe aortic stenosis.  Aorta: Normal size with mild diffuse atherosclerotic plaque and  calcifications. No dissection.  Sinotubular Junction: 29.0 x 27.9 mm  Ascending Thoracic Aorta: 36.6 x 36.6 mm  Aortic Arch: 23.6 x 23.1 mm  Descending Thoracic Aorta: 20.4 x 19.9 mm  Sinus of Valsalva Measurements:  Non-coronary: 32 mm  Right -coronary: 31 mm  Left -coronary: 32 mm  Coronary Artery Height above Annulus:  Left Main: 13 mm  Right  Coronary: 19 mm  Virtual Basal Annulus Measurements:  Maximum/Minimum Diameter: 28.0 x 22.0 mm  Mean Diameter: 24.8 mm  Perimeter: 80.1 mm  Area: 483 mm2  Optimum Fluoroscopic Angle for Delivery: LAO 11 CAU 14  Mildly dilated pulmonary artery measuring 31 mm.  IMPRESSION:  1. Trileaflet aortic valve with severely calcified leaflets and  severely restricted leaflet opening but only minimal calcifications  extending into the LVOT. Aortic valve calcium score 4399 consistent  with severe aortic stenosis. Annular measurements suitable for  delivery of a 26 mm Edwards-SAPIEN 3 valve.  2. Sufficient coronary to annulus distance.  3. Optimum Fluoroscopic Angle for Delivery: LAO 11 CAU 14.  4. No thrombus in the left atrial appendage.  Electronically Signed  By: Ena Dawley  On: 02/24/2020 13:35   Signed by Dorothy Spark, MD on 02/24/2020 1:38 PM  Narrative & Impression  EXAM:  OVER-READ INTERPRETATION CT CHEST  The following report is an over-read performed by radiologist Dr.  Vinnie Langton of [facility] [place] on 02/23/2020. This  over-read does not include interpretation of cardiac or coronary  anatomy or pathology. The coronary calcium score/coronary CTA  interpretation by the cardiologist is attached.  COMPARISON: No priors.  FINDINGS:  Extracardiac findings will be described separately under dictation  for contemporaneously obtained CTA chest, abdomen and pelvis.  IMPRESSION:  Please see separate dictation for contemporaneously obtained CTA  chest, abdomen and pelvis dated 02/23/2020 for full description of  relevant extracardiac findings.  Electronically Signed:  By: Vinnie Langton M.D.  On: 02/23/2020 12:03    Narrative & Impression  CLINICAL DATA: 79 year old male with history of severe aortic  stenosis. Preprocedural study prior to potential transcatheter  aortic valve replacement (TAVR) procedure.  EXAM:  CT ANGIOGRAPHY CHEST, ABDOMEN AND PELVIS   TECHNIQUE:  Non-contrast CT of the chest was initially obtained.  Multidetector CT imaging through the chest, abdomen and pelvis was  performed using the standard protocol during bolus administration of  intravenous contrast. Multiplanar reconstructed images and MIPs were  obtained and reviewed to evaluate the vascular anatomy.  CONTRAST: 151m OMNIPAQUE IOHEXOL 350 MG/ML SOLN  COMPARISON: No priors.  FINDINGS:  CTA CHEST FINDINGS  Cardiovascular: Heart size is mildly enlarged. There is no  significant pericardial fluid, thickening or pericardial  calcification. There is aortic atherosclerosis, as well as  atherosclerosis of the great vessels of the mediastinum and the  coronary arteries, including calcified atherosclerotic plaque in the  left anterior descending coronary artery. Severe thickening  calcification of the aortic valve. Calcifications of the mitral  annulus.  Mediastinum/Lymph Nodes: No pathologically enlarged mediastinal or  hilar lymph nodes. Esophagus is unremarkable in appearance. No  axillary lymphadenopathy.  Lungs/Pleura: Moderate bilateral pleural effusions lying dependently  with extensive passive  subsegmental atelectasis in the lower lobes  of the lungs bilaterally. No confluent consolidative airspace  disease. No suspicious appearing pulmonary nodules or masses are  noted.  Musculoskeletal/Soft Tissues: There are no aggressive appearing  lytic or blastic lesions noted in the visualized portions of the  skeleton.  CTA ABDOMEN AND PELVIS FINDINGS  Hepatobiliary: Low-attenuation lesions in the liver compatible with  simple cysts, largest of which is in segment 2 measuring 1.6 x 1.4  cm. No other suspicious appearing cystic or solid hepatic lesions.  Gallbladder is normal in appearance.  Pancreas: No pancreatic mass. No pancreatic ductal dilatation. No  pancreatic or peripancreatic fluid collections or inflammatory  changes.  Spleen: Unremarkable.   Adrenals/Urinary Tract: Mild adreniform thickening of the adrenal  glands bilaterally, favored to reflect adrenal hyperplasia.  Bilateral kidneys are normal in appearance. No  hydroureteronephrosis. Urinary bladder is unremarkable in  appearance.  Stomach/Bowel: Normal appearance of the stomach. No pathologic  dilatation of small bowel or colon. Numerous colonic diverticulae  are noted, particularly in the sigmoid colon, without definite  surrounding inflammatory changes to suggest an acute diverticulitis  at this time. Normal appendix.  Vascular/Lymphatic: Aortic atherosclerosis, without evidence of  aneurysm or dissection in the abdominal or pelvic vasculature. No  lymphadenopathy noted in the abdomen or pelvis.  Reproductive: Prostate gland and seminal vesicles are unremarkable  in appearance.  Other: No significant volume of ascites. No pneumoperitoneum.  Musculoskeletal: There are no aggressive appearing lytic or blastic  lesions noted in the visualized portions of the skeleton.  VASCULAR MEASUREMENTS PERTINENT TO TAVR:  AORTA:  Minimal Aortic Diameter-13 x 14 mm  Severity of Aortic Calcification-moderate to severe  RIGHT PELVIS:  Right Common Iliac Artery -  Minimal Diameter-7.5 x 6.8 mm  Tortuosity-moderate  Calcification-moderate  Right External Iliac Artery -  Minimal Diameter-6.9 x 6.3 mm  Tortuosity-moderate  Calcification-none  Right Common Femoral Artery -  Minimal Diameter-8.7 x 5.6 mm  Tortuosity-mild  Calcification-mild  LEFT PELVIS:  Left Common Iliac Artery -  Minimal Diameter-8.1 x 7.8 mm  Tortuosity-mild  Calcification-moderate  Left External Iliac Artery -  Minimal Diameter-6.6 x 6.9 mm  Tortuosity-moderate  Calcification-none  Left Common Femoral Artery -  Minimal Diameter-7.3 x 7.5 mm  Tortuosity-mild  Calcification-mild  Review of the MIP images confirms the above findings.  IMPRESSION:  1. Vascular findings and measurements pertinent to  potential TAVR  procedure, as detailed above.  2. Severe thickening calcification of the aortic valve, compatible  with the reported clinical history of severe aortic stenosis.  3. Aortic atherosclerosis, in addition to left anterior descending  coronary artery disease. Assessment for potential risk factor  modification, dietary therapy or pharmacologic therapy may be  warranted, if clinically indicated.  4. Moderate bilateral pleural effusions lying dependently with  extensive passive subsegmental atelectasis in the lower lobes of the  lungs bilaterally.  5. Colonic diverticulosis without evidence of acute diverticulitis  at this time.  6. Additional incidental findings, as above  Electronically Signed  By: Vinnie Langton M.D.  On: 02/23/2020 14:56   STS Risk Score:  Risk of Mortality:  1.717%  Renal Failure:  1.319%  Permanent Stroke:  1.184%  Prolonged Ventilation:  7.402%  DSW Infection:  0.051%  Reoperation:  6.053%  Morbidity or Mortality:  13.396%  Short Length of Stay:  30.711%  Long Length of Stay:  6.897%    Impression:   This 79 year old gentleman has stage D, severe, symptomatic aortic stenosis with New York Heart Association  class III symptoms of exertional fatigue and shortness of breath with minimal activity as well as markedly decreased ejection fraction of 25% consistent with combined chronic systolic and diastolic congestive heart failure.. He has also had lower extremity edema and an episode last week of left-sided chest discomfort that lasted most of the day. I have personally reviewed his 2D echocardiogram, TEE, cardiac catheterization, and CTA studies. His 2D echo showed a trileaflet aortic valve with severe calcification and restricted mobility. The mean gradient was measured at 41 mmHg with an aortic valve area of 0.49 cm. Left ventricular ejection fraction was 25 to 30%. TEE confirmed the above findings and showed moderate mitral regurgitation. Left  ventricular ejection fraction was estimated 20 to 25% by TEE with global hypokinesis. Right ventricular systolic function was mildly to moderately reduced with mild right ventricular enlargement. Cardiac catheterization showed no significant coronary disease. There is moderate pulmonary hypertension and a low cardiac index of 1.82. I agree that aortic valve replacement is indicated in this patient for relief of his symptoms and to prevent further left ventricular deterioration. Given his age and reduced ejection fraction I think TAVR is the best option for him. His gated cardiac CTA shows anatomy suitable for TAVR using a SAPIEN 3 valve. His abdominal and pelvic CTA shows adequate pelvic vascular anatomy to allow transfemoral insertion. CTA of the chest shows moderate bilateral pleural effusions with bilateral lower lobe atelectasis. He would benefit from having these drained at some point but I think I would wait until he has his valve fixed in case he has a vagal reaction and hypotension which could be catastrophic with severe aortic stenosis. He said that he can lay flat without any difficulty at this time.  The patient was counseled at length regarding treatment alternatives for management of severe symptomatic aortic stenosis. The risks and benefits of surgical intervention has been discussed in detail. Long-term prognosis with medical therapy was discussed. Alternative approaches such as conventional surgical aortic valve replacement, transcatheter aortic valve replacement, and palliative medical therapy were compared and contrasted at length. This discussion was placed in the context of the patient's own specific clinical presentation and past medical history. All of his questions have been addressed.   Following the decision to proceed with transcatheter aortic valve replacement, a discussion was held regarding what types of management strategies would be attempted intraoperatively in the event of  life-threatening complications, including whether or not the patient would be considered a candidate for the use of cardiopulmonary bypass and/or conversion to open sternotomy for attempted surgical intervention. The patient is aware of the fact that transient use of cardiopulmonary bypass may be necessary. Although he is 79 years old with severe left ventricular systolic dysfunction I would consider him a candidate for emergent sternotomy to manage any intraoperative complications, although the risk of this would be significantly increased if required. The patient has been advised of a variety of complications that might develop including but not limited to risks of death, stroke, paravalvular leak, aortic dissection or other major vascular complications, aortic annulus rupture, device embolization, cardiac rupture or perforation, mitral regurgitation, acute myocardial infarction, arrhythmia, heart block or bradycardia requiring permanent pacemaker placement, congestive heart failure, respiratory failure, renal failure, pneumonia, infection, other late complications related to structural valve deterioration or migration, or other complications that might ultimately cause a temporary or permanent loss of functional independence or other long term morbidity. The patient provides full informed consent for the procedure as described and all questions were  answered.   Plan:   Transfemoral TAVR   Gaye Pollack, MD

## 2020-03-14 ENCOUNTER — Encounter (HOSPITAL_COMMUNITY)
Admission: RE | Disposition: A | Discharge status: Home / Self Care | Payer: Medicare Other | Source: Home / Self Care | Attending: Cardiovascular Disease

## 2020-03-14 ENCOUNTER — Inpatient Hospital Stay (HOSPITAL_COMMUNITY)
Admission: RE | Admit: 2020-03-14 | Discharge: 2020-03-15 | DRG: 266 | Disposition: A | Discharge status: Home / Self Care | Payer: Medicare Other | Attending: Cardiovascular Disease | Admitting: Cardiovascular Disease

## 2020-03-14 ENCOUNTER — Encounter (HOSPITAL_COMMUNITY): Payer: Self-pay | Admitting: Cardiovascular Disease

## 2020-03-14 ENCOUNTER — Inpatient Hospital Stay (HOSPITAL_COMMUNITY)
Admission: RE | Admit: 2020-03-14 | Discharge: 2020-03-14 | Disposition: A | Discharge status: Home / Self Care | Payer: Medicare Other | Source: Home / Self Care | Attending: Cardiovascular Disease | Admitting: Cardiovascular Disease

## 2020-03-14 ENCOUNTER — Other Ambulatory Visit: Payer: Self-pay

## 2020-03-14 ENCOUNTER — Inpatient Hospital Stay (HOSPITAL_COMMUNITY): Payer: Medicare Other | Admitting: Vascular Surgery

## 2020-03-14 ENCOUNTER — Other Ambulatory Visit: Payer: Self-pay | Admitting: Physician Assistant

## 2020-03-14 ENCOUNTER — Inpatient Hospital Stay (HOSPITAL_COMMUNITY): Payer: Medicare Other | Admitting: Certified Registered"

## 2020-03-14 DIAGNOSIS — I420 Dilated cardiomyopathy: Secondary | ICD-10-CM | POA: Diagnosis not present

## 2020-03-14 DIAGNOSIS — Z20822 Contact with and (suspected) exposure to covid-19: Secondary | ICD-10-CM | POA: Diagnosis present

## 2020-03-14 DIAGNOSIS — I272 Pulmonary hypertension, unspecified: Secondary | ICD-10-CM | POA: Diagnosis present

## 2020-03-14 DIAGNOSIS — E785 Hyperlipidemia, unspecified: Secondary | ICD-10-CM | POA: Diagnosis present

## 2020-03-14 DIAGNOSIS — R001 Bradycardia, unspecified: Secondary | ICD-10-CM | POA: Diagnosis not present

## 2020-03-14 DIAGNOSIS — Z7982 Long term (current) use of aspirin: Secondary | ICD-10-CM | POA: Diagnosis not present

## 2020-03-14 DIAGNOSIS — Z952 Presence of prosthetic heart valve: Secondary | ICD-10-CM | POA: Diagnosis not present

## 2020-03-14 DIAGNOSIS — I5023 Acute on chronic systolic (congestive) heart failure: Secondary | ICD-10-CM | POA: Diagnosis not present

## 2020-03-14 DIAGNOSIS — I428 Other cardiomyopathies: Secondary | ICD-10-CM | POA: Diagnosis not present

## 2020-03-14 DIAGNOSIS — Z8616 Personal history of COVID-19: Secondary | ICD-10-CM

## 2020-03-14 DIAGNOSIS — I5042 Chronic combined systolic (congestive) and diastolic (congestive) heart failure: Secondary | ICD-10-CM | POA: Diagnosis not present

## 2020-03-14 DIAGNOSIS — I440 Atrioventricular block, first degree: Secondary | ICD-10-CM | POA: Diagnosis present

## 2020-03-14 DIAGNOSIS — J90 Pleural effusion, not elsewhere classified: Secondary | ICD-10-CM | POA: Diagnosis not present

## 2020-03-14 DIAGNOSIS — Z006 Encounter for examination for normal comparison and control in clinical research program: Secondary | ICD-10-CM | POA: Diagnosis not present

## 2020-03-14 DIAGNOSIS — I5041 Acute combined systolic (congestive) and diastolic (congestive) heart failure: Secondary | ICD-10-CM | POA: Diagnosis present

## 2020-03-14 DIAGNOSIS — I447 Left bundle-branch block, unspecified: Secondary | ICD-10-CM | POA: Diagnosis present

## 2020-03-14 DIAGNOSIS — Z8249 Family history of ischemic heart disease and other diseases of the circulatory system: Secondary | ICD-10-CM

## 2020-03-14 DIAGNOSIS — I350 Nonrheumatic aortic (valve) stenosis: Secondary | ICD-10-CM

## 2020-03-14 DIAGNOSIS — I110 Hypertensive heart disease with heart failure: Secondary | ICD-10-CM | POA: Diagnosis present

## 2020-03-14 DIAGNOSIS — H903 Sensorineural hearing loss, bilateral: Secondary | ICD-10-CM | POA: Diagnosis present

## 2020-03-14 DIAGNOSIS — I10 Essential (primary) hypertension: Secondary | ICD-10-CM | POA: Diagnosis present

## 2020-03-14 DIAGNOSIS — Z79899 Other long term (current) drug therapy: Secondary | ICD-10-CM | POA: Diagnosis not present

## 2020-03-14 DIAGNOSIS — I340 Nonrheumatic mitral (valve) insufficiency: Secondary | ICD-10-CM | POA: Diagnosis present

## 2020-03-14 DIAGNOSIS — J9811 Atelectasis: Secondary | ICD-10-CM | POA: Diagnosis not present

## 2020-03-14 DIAGNOSIS — F419 Anxiety disorder, unspecified: Secondary | ICD-10-CM | POA: Diagnosis present

## 2020-03-14 DIAGNOSIS — Z833 Family history of diabetes mellitus: Secondary | ICD-10-CM | POA: Diagnosis not present

## 2020-03-14 DIAGNOSIS — I361 Nonrheumatic tricuspid (valve) insufficiency: Secondary | ICD-10-CM | POA: Diagnosis not present

## 2020-03-14 HISTORY — DX: Nonrheumatic aortic (valve) stenosis: I35.0

## 2020-03-14 HISTORY — PX: TEE WITHOUT CARDIOVERSION: SHX5443

## 2020-03-14 HISTORY — PX: TRANSCATHETER AORTIC VALVE REPLACEMENT, TRANSFEMORAL: SHX6400

## 2020-03-14 HISTORY — DX: Hyperlipidemia, unspecified: E78.5

## 2020-03-14 HISTORY — DX: Presence of prosthetic heart valve: Z95.2

## 2020-03-14 HISTORY — DX: Essential (primary) hypertension: I10

## 2020-03-14 LAB — POCT ACTIVATED CLOTTING TIME
Activated Clotting Time: 109 seconds
Activated Clotting Time: 213 seconds
Activated Clotting Time: 109 seconds

## 2020-03-14 LAB — ECHOCARDIOGRAM LIMITED
AR max vel: 1.49 cm2
AV Area VTI: 1.39 cm2
AV Area mean vel: 1.44 cm2
AV Mean grad: 40 mmHg
AV Peak grad: 9.1 mmHg
Ao pk vel: 1.51 m/s

## 2020-03-14 LAB — POCT I-STAT, CHEM 8
BUN: 15 mg/dL (ref 8–23)
BUN: 16 mg/dL (ref 8–23)
Calcium, Ion: 1.3 mmol/L (ref 1.15–1.40)
Calcium, Ion: 1.32 mmol/L (ref 1.15–1.40)
Chloride: 106 mmol/L (ref 98–111)
Chloride: 107 mmol/L (ref 98–111)
Creatinine, Ser: 0.8 mg/dL (ref 0.61–1.24)
Creatinine, Ser: 0.9 mg/dL (ref 0.61–1.24)
Glucose, Bld: 97 mg/dL (ref 70–99)
Glucose, Bld: 107 mg/dL — ABNORMAL HIGH (ref 70–99)
HCT: 31 % — ABNORMAL LOW (ref 39.0–52.0)
HCT: 32 % — ABNORMAL LOW (ref 39.0–52.0)
Hemoglobin: 10.5 g/dL — ABNORMAL LOW (ref 13.0–17.0)
Hemoglobin: 10.9 g/dL — ABNORMAL LOW (ref 13.0–17.0)
Potassium: 3.6 mmol/L (ref 3.5–5.1)
Potassium: 3.8 mmol/L (ref 3.5–5.1)
Sodium: 141 mmol/L (ref 135–145)
Sodium: 143 mmol/L (ref 135–145)
TCO2: 22 mmol/L (ref 22–32)
TCO2: 24 mmol/L (ref 22–32)

## 2020-03-14 SURGERY — IMPLANTATION, AORTIC VALVE, TRANSCATHETER, FEMORAL APPROACH
Anesthesia: Monitor Anesthesia Care | Site: Chest

## 2020-03-14 MED ORDER — NITROGLYCERIN IN D5W 200-5 MCG/ML-% IV SOLN: 0.0000 ug/min | INTRAVENOUS | Status: DC

## 2020-03-14 MED ORDER — CHLORHEXIDINE GLUCONATE 4 % EX LIQD
30.0000 mL | CUTANEOUS | Status: DC
  Filled 2020-03-14: qty 30

## 2020-03-14 MED ORDER — VANCOMYCIN HCL IN DEXTROSE 1-5 GM/200ML-% IV SOLN
1000.0000 mg | Freq: Once | INTRAVENOUS | Status: AC
  Administered 2020-03-14: 1000 mg via INTRAVENOUS
  Filled 2020-03-14: qty 200

## 2020-03-14 MED ORDER — HEPARIN (PORCINE) IN NACL 1000-0.9 UT/500ML-% IV SOLN
INTRAVENOUS | Status: DC | PRN
  Administered 2020-03-14 (×2): 500 mL

## 2020-03-14 MED ORDER — LIDOCAINE HCL (PF) 1 % IJ SOLN
INTRAMUSCULAR | Status: DC | PRN
  Administered 2020-03-14: 12 mL

## 2020-03-14 MED ORDER — ONDANSETRON HCL 4 MG/2ML IJ SOLN: 4.0000 mg | Freq: Four times a day (QID) | INTRAMUSCULAR | Status: DC | PRN

## 2020-03-14 MED ORDER — MIDAZOLAM HCL 5 MG/5ML IJ SOLN
INTRAMUSCULAR | Status: AC
  Filled 2020-03-14: qty 5

## 2020-03-14 MED ORDER — CHLORHEXIDINE GLUCONATE 0.12 % MT SOLN
15.0000 mL | Freq: Once | OROMUCOSAL | Status: AC
  Administered 2020-03-14: 15 mL via OROMUCOSAL
  Filled 2020-03-14 (×2): qty 15

## 2020-03-14 MED ORDER — LIDOCAINE HCL 1 % IJ SOLN
INTRAMUSCULAR | Status: AC
  Filled 2020-03-14: qty 20

## 2020-03-14 MED ORDER — MORPHINE SULFATE (PF) 2 MG/ML IV SOLN: 1.0000 mg | INTRAVENOUS | Status: DC | PRN

## 2020-03-14 MED ORDER — CHLORHEXIDINE GLUCONATE 4 % EX LIQD
60.0000 mL | Freq: Once | CUTANEOUS | Status: DC
  Filled 2020-03-14: qty 60

## 2020-03-14 MED ORDER — LACTATED RINGERS IV SOLN: INTRAVENOUS | Status: DC

## 2020-03-14 MED ORDER — HEPARIN (PORCINE) IN NACL 1000-0.9 UT/500ML-% IV SOLN
INTRAVENOUS | Status: AC
  Filled 2020-03-14: qty 500

## 2020-03-14 MED ORDER — PHENYLEPHRINE HCL-NACL 20-0.9 MG/250ML-% IV SOLN
0.0000 ug/min | INTRAVENOUS | Status: DC
  Filled 2020-03-14: qty 250

## 2020-03-14 MED ORDER — SODIUM CHLORIDE 0.9 % IV SOLN: INTRAVENOUS | Status: DC

## 2020-03-14 MED ORDER — SODIUM CHLORIDE 0.9% FLUSH
3.0000 mL | Freq: Two times a day (BID) | INTRAVENOUS | Status: DC
  Administered 2020-03-14 – 2020-03-15 (×2): 3 mL via INTRAVENOUS

## 2020-03-14 MED ORDER — SODIUM CHLORIDE 0.9 % IV SOLN: 250.0000 mL | INTRAVENOUS | Status: DC | PRN

## 2020-03-14 MED ORDER — SODIUM CHLORIDE 0.9% FLUSH: 3.0000 mL | INTRAVENOUS | Status: DC | PRN

## 2020-03-14 MED ORDER — ACETAMINOPHEN 650 MG RE SUPP: 650.0000 mg | Freq: Four times a day (QID) | RECTAL | Status: DC | PRN

## 2020-03-14 MED ORDER — SODIUM CHLORIDE 0.9 % IV SOLN
1.5000 g | Freq: Two times a day (BID) | INTRAVENOUS | Status: DC
  Administered 2020-03-14 – 2020-03-15 (×2): 1.5 g via INTRAVENOUS
  Filled 2020-03-14 (×3): qty 1.5

## 2020-03-14 MED ORDER — PROTAMINE SULFATE 10 MG/ML IV SOLN
INTRAVENOUS | Status: DC | PRN
  Administered 2020-03-14: 80 mg via INTRAVENOUS

## 2020-03-14 MED ORDER — TRAMADOL HCL 50 MG PO TABS: 50.0000 mg | ORAL_TABLET | ORAL | Status: DC | PRN

## 2020-03-14 MED ORDER — IOHEXOL 350 MG/ML SOLN
INTRAVENOUS | Status: DC | PRN
  Administered 2020-03-14: 40 mL

## 2020-03-14 MED ORDER — IOHEXOL 350 MG/ML SOLN
INTRAVENOUS | Status: AC
  Filled 2020-03-14: qty 1

## 2020-03-14 MED ORDER — ACETAMINOPHEN 325 MG PO TABS: 650.0000 mg | ORAL_TABLET | Freq: Four times a day (QID) | ORAL | Status: DC | PRN

## 2020-03-14 MED ORDER — HEPARIN SODIUM (PORCINE) 1000 UNIT/ML IJ SOLN
INTRAMUSCULAR | Status: DC | PRN
  Administered 2020-03-14: 8000 [IU] via INTRAVENOUS

## 2020-03-14 MED ORDER — SODIUM CHLORIDE 0.9 % IV SOLN: INTRAVENOUS | Status: AC

## 2020-03-14 MED ORDER — CLOPIDOGREL BISULFATE 75 MG PO TABS
75.0000 mg | ORAL_TABLET | Freq: Every day | ORAL | Status: DC
  Administered 2020-03-15: 75 mg via ORAL
  Filled 2020-03-14: qty 1

## 2020-03-14 MED ORDER — OXYCODONE HCL 5 MG PO TABS: 5.0000 mg | ORAL_TABLET | ORAL | Status: DC | PRN

## 2020-03-14 MED ORDER — MIDAZOLAM HCL 2 MG/2ML IJ SOLN
INTRAMUSCULAR | Status: DC | PRN
  Administered 2020-03-14 (×2): 1 mg via INTRAVENOUS

## 2020-03-14 MED ORDER — ASPIRIN EC 81 MG PO TBEC
81.0000 mg | DELAYED_RELEASE_TABLET | Freq: Every day | ORAL | Status: DC
  Administered 2020-03-15: 81 mg via ORAL
  Filled 2020-03-14: qty 1

## 2020-03-14 SURGICAL SUPPLY — 31 items
BAG SNAP BAND KOVER 36X36 (MISCELLANEOUS) ×8 IMPLANT
BLANKET WARM UNDERBOD FULL ACC (MISCELLANEOUS) ×4 IMPLANT
CABLE ADAPT PACING TEMP 12FT (ADAPTER) ×4 IMPLANT
CATH 26 ULTRA DELIVERY (CATHETERS) ×4 IMPLANT
CATH DIAG 6FR PIGTAIL ANGLED (CATHETERS) ×8 IMPLANT
CATH INFINITI 6F AL1 (CATHETERS) ×4 IMPLANT
CATH INFINITI 6F AL2 (CATHETERS) ×4 IMPLANT
CATH S G BIP PACING (CATHETERS) ×4 IMPLANT
CLOSURE MYNX CONTROL 6F/7F (Vascular Products) ×4 IMPLANT
CRIMPER (MISCELLANEOUS) ×4 IMPLANT
DEVICE CLOSURE PERCLS PRGLD 6F (VASCULAR PRODUCTS) ×4 IMPLANT
DEVICE INFLATION ATRION QL2530 (MISCELLANEOUS) ×4 IMPLANT
GUIDEWIRE SAFE TJ AMPLATZ EXST (WIRE) ×4 IMPLANT
KIT HEART LEFT (KITS) ×4 IMPLANT
PACK CARDIAC CATHETERIZATION (CUSTOM PROCEDURE TRAY) ×4 IMPLANT
PERCLOSE PROGLIDE 6F (VASCULAR PRODUCTS) ×8
SHEATH 14X36 EDWARDS (SHEATH) ×8 IMPLANT
SHEATH BRITE TIP 7FR 35CM (SHEATH) ×4 IMPLANT
SHEATH PINNACLE 6F 10CM (SHEATH) ×4 IMPLANT
SHEATH PINNACLE 8F 10CM (SHEATH) ×4 IMPLANT
SHEATH PROBE COVER 6X72 (BAG) ×4 IMPLANT
SLEEVE REPOSITIONING LENGTH 30 (MISCELLANEOUS) ×4 IMPLANT
STOPCOCK MORSE 400PSI 3WAY (MISCELLANEOUS) ×8 IMPLANT
SYR MEDRAD MARK V 150ML (SYRINGE) ×4 IMPLANT
TRANSDUCER W/STOPCOCK (MISCELLANEOUS) ×8 IMPLANT
TUBE CONN 8.8X1320 FR HP M-F (CONNECTOR) ×4 IMPLANT
VALVE 26 ULTRA SAPIEN KIT (Valve) ×4 IMPLANT
WIRE AMPLATZ SS-J .035X180CM (WIRE) ×4 IMPLANT
WIRE EMERALD 3MM-J .035X150CM (WIRE) ×4 IMPLANT
WIRE EMERALD 3MM-J .035X260CM (WIRE) ×4 IMPLANT
WIRE EMERALD ST .035X260CM (WIRE) ×4 IMPLANT

## 2020-03-14 NOTE — Op Note (Signed)
[facility]   MULTIDISCIPLINARY HEART VALVE TEAM   TAVR OPERATIVE NOTE   Date of Procedure:  03/14/2020  Preoperative Diagnosis: Severe Aortic Stenosis   Postoperative Diagnosis: Same   Procedure:    Transcatheter Aortic Valve Replacement - Percutaneous Right Transfemoral Approach  Edwards Sapien 3 Ultra THV (size 26 mm, model # 9750TFX, serial # [id number])   Co-Surgeons:  Gaye Pollack, MD and Lauree Chandler, MD   Anesthesiologist:  Laurie Panda, MD  Echocardiographer:  Ena Dawley, MD  Pre-operative Echo Findings:  Severe aortic stenosis  Moderate left ventricular systolic dysfunction  Post-operative Echo Findings:  No paravalvular leak  Improved left ventricular systolic function   BRIEF CLINICAL NOTE AND INDICATIONS FOR SURGERY  This 79 year old gentleman has stage D, severe, symptomatic aortic stenosis with New York Heart Association class III symptoms of exertional fatigue and shortness of breath with minimal activity as well as markedly decreased ejection fraction of 25% consistent with combined chronic systolic and diastolic congestive heart failure.. He has also had lower extremity edema and an episode last week of left-sided chest discomfort that lasted most of the day. I have personally reviewed his 2D echocardiogram, TEE, cardiac catheterization, and CTA studies. His 2D echo showed a trileaflet aortic valve with severe calcification and restricted mobility. The mean gradient was measured at 41 mmHg with an aortic valve area of 0.49 cm. Left ventricular ejection fraction was 25 to 30%. TEE confirmed the above findings and showed moderate mitral regurgitation. Left ventricular ejection fraction was estimated 20 to 25% by TEE with global hypokinesis. Right ventricular systolic function was mildly to moderately reduced with mild right ventricular enlargement. Cardiac catheterization showed no significant coronary disease. There is moderate  pulmonary hypertension and a low cardiac index of 1.82. I agree that aortic valve replacement is indicated in this patient for relief of his symptoms and to prevent further left ventricular deterioration. Given his age and reduced ejection fraction I think TAVR is the best option for him. His gated cardiac CTA shows anatomy suitable for TAVR using a SAPIEN 3 valve. His abdominal and pelvic CTA shows adequate pelvic vascular anatomy to allow transfemoral insertion. CTA of the chest shows moderate bilateral pleural effusions with bilateral lower lobe atelectasis. He would benefit from having these drained at some point but I think I would wait until he has his valve fixed in case he has a vagal reaction and hypotension which could be catastrophic with severe aortic stenosis. He said that he can lay flat without any difficulty at this time.  The patient was counseled at length regarding treatment alternatives for management of severe symptomatic aortic stenosis. The risks and benefits of surgical intervention has been discussed in detail. Long-term prognosis with medical therapy was discussed. Alternative approaches such as conventional surgical aortic valve replacement, transcatheter aortic valve replacement, and palliative medical therapy were compared and contrasted at length. This discussion was placed in the context of the patient's own specific clinical presentation and past medical history. All of his questions have been addressed.   Following the decision to proceed with transcatheter aortic valve replacement, a discussion was held regarding what types of management strategies would be attempted intraoperatively in the event of life-threatening complications, including whether or not the patient would be considered a candidate for the use of cardiopulmonary bypass and/or conversion to open sternotomy for attempted surgical intervention. The patient is aware of the fact that transient use of cardiopulmonary  bypass may be necessary. Although he is 79  years old with severe left ventricular systolic dysfunction I would consider him a candidate for emergent sternotomy to manage any intraoperative complications, although the risk of this would be significantly increased if required. The patient has been advised of a variety of complications that might develop including but not limited to risks of death, stroke, paravalvular leak, aortic dissection or other major vascular complications, aortic annulus rupture, device embolization, cardiac rupture or perforation, mitral regurgitation, acute myocardial infarction, arrhythmia, heart block or bradycardia requiring permanent pacemaker placement, congestive heart failure, respiratory failure, renal failure, pneumonia, infection, other late complications related to structural valve deterioration or migration, or other complications that might ultimately cause a temporary or permanent loss of functional independence or other long term morbidity. The patient provides full informed consent for the procedure as described and all questions were answered.     DETAILS OF THE OPERATIVE PROCEDURE  PREPARATION:    The patient was brought to the operating room on the above mentioned date and appropriate monitoring was established by the anesthesia team. The patient was placed in the supine position on the operating table.  Intravenous antibiotics were administered. The patient was monitored closely throughout the procedure under conscious sedation.  Baseline transthoracic echocardiogram was performed. The patient's abdomen and both groins were prepped and draped in a sterile manner. A time out procedure was performed.   PERIPHERAL ACCESS:    Using the modified Seldinger technique, femoral arterial and venous access was obtained with placement of 6 Fr sheaths on the left side.  A pigtail diagnostic catheter was passed through the left arterial sheath under fluoroscopic guidance  into the aortic root.  A temporary transvenous pacemaker catheter was passed through the left femoral venous sheath under fluoroscopic guidance into the right ventricle.  The pacemaker was tested to ensure stable lead placement and pacemaker capture. Aortic root angiography was performed in order to determine the optimal angiographic angle for valve deployment.   TRANSFEMORAL ACCESS:   Percutaneous transfemoral access and sheath placement was performed using ultrasound guidance.  The right common femoral artery was cannulated using a micropuncture needle and appropriate location was verified using hand injection angiogram.  A pair of Abbott Perclose percutaneous closure devices were placed and a 6 French sheath replaced into the femoral artery.  The patient was heparinized systemically and ACT verified > 250 seconds.    A 14 Fr transfemoral E-sheath was introduced into the right common femoral artery after progressively dilating over an Amplatz superstiff wire. An AL-2 catheter was used to direct a straight-tip exchange length wire across the native aortic valve into the left ventricle. This was exchanged out for a pigtail catheter and position was confirmed in the LV apex. Simultaneous LV and Ao pressures were recorded.  The pigtail catheter was exchanged for an Amplatz Extra-stiff wire in the LV apex.     BALLOON AORTIC VALVULOPLASTY:   Not performed  TRANSCATHETER HEART VALVE DEPLOYMENT:   An Edwards Sapien 3 Ultra transcatheter heart valve (size 26 mm, model #[id number], serial #[id number]) was prepared and crimped per manufacturer's guidelines, and the proper orientation of the valve is confirmed on the [employer] delivery system. The valve was advanced through the introducer sheath using normal technique until in an appropriate position in the abdominal aorta beyond the sheath tip. The balloon was then retracted and using the fine-tuning wheel was centered on the valve. The valve was then  advanced across the aortic arch using appropriate flexion of the catheter. The valve was carefully positioned  across the aortic valve annulus. The Commander catheter was retracted using normal technique. Once final position of the valve has been confirmed by angiographic assessment, the valve is deployed while temporarily holding ventilation and during rapid ventricular pacing to maintain systolic blood pressure < 50 mmHg and pulse pressure < 10 mmHg. The balloon inflation is held for >3 seconds after reaching full deployment volume. Once the balloon has fully deflated the balloon is retracted into the ascending aorta and valve function is assessed using echocardiography. There is felt to be no paravalvular leak and no central aortic insufficiency.  Transaortic valve gradients improved from 65/40 to 9/6. LVEF improved from 25-30% to 35-40%. The patient's hemodynamic recovery following valve deployment is good.  The deployment balloon and guidewire are both removed.    PROCEDURE COMPLETION:   The sheath was removed and femoral artery closure performed.  Protamine was administered once femoral arterial repair was complete. The temporary pacemaker, pigtail catheters and femoral sheaths were removed with manual pressure used for hemostasis.  A Mynx femoral closure device was utilized following removal of the diagnostic sheath in the left femoral artery.  The patient tolerated the procedure well and is transported to the cath lab recovery area in stable condition. There were no immediate intraoperative complications. All sponge instrument and needle counts are verified correct at completion of the operation.   No blood products were administered during the operation.  The patient received a total of 40 mL of intravenous contrast during the procedure.   Gaye Pollack, MD 03/14/2020

## 2020-03-14 NOTE — Anesthesia Procedure Notes (Signed)
Arterial Line Insertion Start/End9/14/2021 9:00 AM, 03/14/2020 9:10 AM Performed by: Amadeo Garnet, CRNA, CRNA  Patient location: Pre-op. Preanesthetic checklist: patient identified, IV checked, site marked, risks and benefits discussed, surgical consent, monitors and equipment checked, pre-op evaluation, timeout performed and anesthesia consent Lidocaine 1% used for infiltration Right, radial was placed Catheter size: 20 G Hand hygiene performed  and maximum sterile barriers used   Attempts: 2 Procedure performed without using ultrasound guided technique. Following insertion, dressing applied and Biopatch. Post procedure assessment: normal  Patient tolerated the procedure well with no immediate complications.

## 2020-03-14 NOTE — Progress Notes (Signed)
  [place] VALVE TEAM  Patient doing well s/p TAVR. He is hemodynamically stable. Groin sites stable. 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 QRS 126). Arterial line discontinued and transferred to 4E. Plan for early ambulation after bedrest completed and hopeful discharge over the next 24-48 hours.   Angelena Form PA-C  [facility]  Pager [phone]

## 2020-03-14 NOTE — Progress Notes (Signed)
Echocardiogram 2D Echocardiogram has been performed.  Oneal Deputy Jina Olenick 03/14/2020, 11:58 AM

## 2020-03-14 NOTE — Progress Notes (Signed)
Handoff given to [facility], CRNA at bedside with patient.

## 2020-03-14 NOTE — Progress Notes (Signed)
Removal of right arterial line without complication. [place] hold time 10 minutes. Site healthy with no oozing, brusing or hematoma present. Cap refill 3 secs. Sterile dressing applied. Dressing dry and intact.

## 2020-03-14 NOTE — Anesthesia Postprocedure Evaluation (Signed)
Anesthesia Post Note  Patient: Cesar Bullock  Procedure(s) Performed: TRANSCATHETER AORTIC VALVE REPLACEMENT, TRANSFEMORAL (N/A Chest) TRANSESOPHAGEAL ECHOCARDIOGRAM (TEE) (N/A )     Patient location during evaluation: SICU Anesthesia Type: MAC Level of consciousness: patient cooperative and awake Pain management: pain level controlled Vital Signs Assessment: post-procedure vital signs reviewed and stable Respiratory status: spontaneous breathing, nonlabored ventilation and respiratory function stable Cardiovascular status: stable Postop Assessment: no apparent nausea or vomiting Anesthetic complications: no   No complications documented.  Last Vitals:  Vitals:   03/14/20 1315 03/14/20 1330  BP: (!) 142/63 (!) 158/68  Pulse: (!) 46 (!) 44  Resp: 18 14  Temp:    SpO2: 98% 99%    Last Pain:  Vitals:   03/14/20 1224  TempSrc: Temporal  PainSc:                  Jeydi Klingel

## 2020-03-14 NOTE — Anesthesia Procedure Notes (Signed)
Procedure Name: MAC Date/Time: 03/14/2020 10:10 AM Performed by: Amadeo Garnet, CRNA Pre-anesthesia Checklist: Patient identified, Emergency Drugs available, Suction available and Patient being monitored Patient Re-evaluated:Patient Re-evaluated prior to induction Oxygen Delivery Method: Simple face mask Preoxygenation: Pre-oxygenation with 100% oxygen Induction Type: IV induction Placement Confirmation: positive ETCO2 and breath sounds checked- equal and bilateral Dental Injury: Teeth and Oropharynx as per pre-operative assessment

## 2020-03-14 NOTE — Transfer of Care (Signed)
Immediate Anesthesia Transfer of Care Note  Patient: Ector Laurel Feinberg  Procedure(s) Performed: TRANSCATHETER AORTIC VALVE REPLACEMENT, TRANSFEMORAL (N/A Chest) TRANSESOPHAGEAL ECHOCARDIOGRAM (TEE) (N/A )  Patient Location: Cath Lab  Anesthesia Type:MAC  Level of Consciousness: awake, alert  and oriented  Airway & Oxygen Therapy: Patient Spontanous Breathing and Patient connected to face mask oxygen  Post-op Assessment: Report given to RN, Post -op Vital signs reviewed and stable and Patient moving all extremities  Post vital signs: Reviewed and stable  Last Vitals:  Vitals Value Taken Time  BP    Temp    Pulse    Resp    SpO2      Last Pain:  Vitals:   03/14/20 1149  TempSrc: Temporal  PainSc: Asleep         Complications: No complications documented.

## 2020-03-14 NOTE — Interval H&P Note (Signed)
History and Physical Interval Note:  03/14/2020 10:02 AM  Cesar Bullock  has presented today for surgery, with the diagnosis of Severe Aortic Stenosis.  The various methods of treatment have been discussed with the patient and family. After consideration of risks, benefits and other options for treatment, the patient has consented to  Procedure(s): TRANSCATHETER AORTIC VALVE REPLACEMENT, TRANSFEMORAL (N/A) TRANSESOPHAGEAL ECHOCARDIOGRAM (TEE) (N/A) as a surgical intervention.  The patient's history has been reviewed, patient examined, no change in status, stable for surgery.  I have reviewed the patient's chart and labs.  Questions were answered to the patient's satisfaction.     Gaye Pollack

## 2020-03-14 NOTE — CV Procedure (Signed)
[facility]  TAVR OPERATIVE NOTE   Date of Procedure:  03/14/2020  Preoperative Diagnosis: Severe Aortic Stenosis   Postoperative Diagnosis: Same   Procedure:    Transcatheter Aortic Valve Replacement - Transfemoral Approach  Edwards Sapien 3 THV (size 26 mm, model # [id number], serial # [id number])   Co-Surgeons:  Lauree Chandler, MD and Gaye Pollack, MD   Anesthesiologist:  Ermalene Postin  Echocardiographer:  Meda Coffee  Pre-operative Echo Findings:  Severe aortic stenosis  Moderate left ventricular systolic dysfunction  Post-operative Echo Findings:  No paravalvular leak  Moderate left ventricular systolic dysfunction  BRIEF CLINICAL NOTE AND INDICATIONS FOR SURGERY  79 yo male with history of hearing loss, chronic systolic CHF, non-ischemic cardiomyopathy and severe aortic stenosis who is here today for TAVR. He had been seen in 2018 by Dr. Saunders Revel for moderate aortic stenosis but was lost to follow up. Recent worsening lower extremity edema, cough and dyspnea. Echo 02/08/20 with LVEF=25-30%, mild MR. The aortic valve is tricuspid with thickened, calcifiied leaflets and limited leaflet excursion. Mean gradient 41 mmHg, peak gradient 62. 22mHg,  AVA 0.49 cm2. Moderate aortic valve insufficiency. Cardiac cath 02/16/20 with no angiographic evidence of CAD. CT scans show anatomy suitable for transfemoral approach to TAVR.   During the course of the patient's preoperative work up they have been evaluated comprehensively [facility].  They have been demonstrated to suffer from symptomatic severe aortic stenosis as noted above. The patient has been counseled extensively as to the relative risks and benefits of all options for the treatment of severe aortic stenosis including long term medical therapy, conventional surgery for aortic valve  replacement, and transcatheter aortic valve replacement.  The patient has been independently evaluated by Dr. BCyndia Bentwith CT surgery and they are felt to be at high risk for conventional surgical aortic valve replacement. The surgeon indicated the patient would be a poor candidate for conventional surgery. Based upon review of all of the patient's preoperative diagnostic tests they are felt to be candidate for transcatheter aortic valve replacement using the transfemoral approach as an alternative to high risk conventional surgery.    Following the decision to proceed with transcatheter aortic valve replacement, a discussion has been held regarding what types of management strategies would be attempted intraoperatively in the event of life-threatening complications, including whether or not the patient would be considered a candidate for the use of cardiopulmonary bypass and/or conversion to open sternotomy for attempted surgical intervention.  The patient has been advised of a variety of complications that might develop peculiar to this approach including but not limited to risks of death, stroke, paravalvular leak, aortic dissection or other major vascular complications, aortic annulus rupture, device embolization, cardiac rupture or perforation, acute myocardial infarction, arrhythmia, heart block or bradycardia requiring permanent pacemaker placement, congestive heart failure, respiratory failure, renal failure, pneumonia, infection, other late complications related to structural valve deterioration or migration, or other complications that might ultimately cause a temporary or permanent loss of functional independence or other long term morbidity.  The patient provides full informed consent for the procedure as described and all questions were answered preoperatively.  DETAILS OF THE OPERATIVE PROCEDURE  PREPARATION:   The patient is brought to the operating room on the above mentioned date and central  monitoring was established by the anesthesia team including placement of a radial arterial line. The patient is placed  in the supine position on the operating table.  Intravenous antibiotics are administered. Conscious sedation is used.   Baseline transthoracic echocardiogram was performed. The patient's chest, abdomen, both groins, and both lower extremities are prepared and draped in a sterile manner. A time out procedure is performed.  PERIPHERAL ACCESS:   Using the modified Seldinger technique, femoral arterial and venous access were obtained with placement of 6 Fr sheaths on the left side using u/s guidance.  A pigtail diagnostic catheter was passed through the femoral arterial sheath under fluoroscopic guidance into the aortic root.  A temporary transvenous pacemaker catheter was passed through the femoral venous sheath under fluoroscopic guidance into the right ventricle.  The pacemaker was tested to ensure stable lead placement and pacemaker capture. Aortic root angiography was performed in order to determine the optimal angiographic angle for valve deployment.  TRANSFEMORAL ACCESS:  A micropuncture kit was used to gain access to the right femoral artery using u/s guidance. Position confirmed with angiography. Pre-closure with double ProGlide closure devices. The patient was heparinized systemically and ACT verified > 250 seconds.    A 14 Fr transfemoral E-sheath was introduced into the  femoral artery after progressively dilating over an Amplatz superstiff wire. An AL-2 catheter was used to direct a straight-tip exchange length wire across the native aortic valve into the left ventricle. This was exchanged out for a pigtail catheter and position was confirmed in the LV apex. Simultaneous LV and Ao pressures were recorded.  The pigtail catheter was then exchanged for an Amplatz Extra-stiff wire in the LV apex.   TRANSCATHETER HEART VALVE DEPLOYMENT:  An Edwards Sapien 3 THV (size 26 mm) was  prepared and crimped per manufacturer's guidelines, and the proper orientation of the valve is confirmed on the [employer] delivery system. The valve was advanced through the introducer sheath using normal technique until in an appropriate position in the abdominal aorta beyond the sheath tip. The balloon was then retracted and using the fine-tuning wheel was centered on the valve. The valve was then advanced across the aortic arch using appropriate flexion of the catheter. The valve was carefully positioned across the aortic valve annulus. The Commander catheter was retracted using normal technique. Once final position of the valve has been confirmed by angiographic assessment, the valve is deployed while temporarily holding ventilation and during rapid ventricular pacing to maintain systolic blood pressure < 50 mmHg and pulse pressure < 10 mmHg. The balloon inflation is held for >3 seconds after reaching full deployment volume. Once the balloon has fully deflated the balloon is retracted into the ascending aorta and valve function is assessed using TTE. There is felt to be no paravalvular leak and no central aortic insufficiency.  The patient's hemodynamic recovery following valve deployment is good.  The deployment balloon and guidewire are both removed. Echo demostrated acceptable post-procedural gradients, stable mitral valve function, and no AI.   PROCEDURE COMPLETION:  The sheath was then removed and closure devices were completed. Protamine was administered once femoral arterial repair was complete. The temporary pacemaker, pigtail catheters and femoral sheaths were removed with a Mynx closure device placed in the artery and manual pressure used for venous hemostasis.   The patient tolerated the procedure well and is transported to the surgical intensive care in stable condition. There were no immediate intraoperative complications. All sponge instrument and needle counts are verified correct at  completion of the operation.   No blood products were administered during the operation.  The patient  received a total of 40 mL of intravenous contrast during the procedure.  Lauree Chandler MD 03/14/2020 11:46 AM

## 2020-03-15 ENCOUNTER — Inpatient Hospital Stay (HOSPITAL_COMMUNITY): Payer: Medicare Other

## 2020-03-15 ENCOUNTER — Other Ambulatory Visit (INDEPENDENT_AMBULATORY_CARE_PROVIDER_SITE_OTHER): Payer: Medicare Other

## 2020-03-15 DIAGNOSIS — I447 Left bundle-branch block, unspecified: Secondary | ICD-10-CM | POA: Diagnosis not present

## 2020-03-15 DIAGNOSIS — Z952 Presence of prosthetic heart valve: Secondary | ICD-10-CM

## 2020-03-15 DIAGNOSIS — I440 Atrioventricular block, first degree: Secondary | ICD-10-CM

## 2020-03-15 DIAGNOSIS — I350 Nonrheumatic aortic (valve) stenosis: Principal | ICD-10-CM

## 2020-03-15 DIAGNOSIS — I361 Nonrheumatic tricuspid (valve) insufficiency: Secondary | ICD-10-CM

## 2020-03-15 DIAGNOSIS — I340 Nonrheumatic mitral (valve) insufficiency: Secondary | ICD-10-CM

## 2020-03-15 HISTORY — PX: IR THORACENTESIS ASP PLEURAL SPACE W/IMG GUIDE: IMG5380

## 2020-03-15 HISTORY — PX: TRANSTHORACIC ECHOCARDIOGRAM: SHX275

## 2020-03-15 LAB — BASIC METABOLIC PANEL
Anion gap: 7 (ref 5–15)
BUN: 17 mg/dL (ref 8–23)
CO2: 26 mmol/L (ref 22–32)
Calcium: 9 mg/dL (ref 8.9–10.3)
Chloride: 107 mmol/L (ref 98–111)
Creatinine, Ser: 1.04 mg/dL (ref 0.61–1.24)
GFR calc Af Amer: >60 mL/min (ref >60)
GFR calc non Af Amer: >60 mL/min (ref >60)
Glucose, Bld: 84 mg/dL (ref 70–99)
Potassium: 3.9 mmol/L (ref 3.5–5.1)
Sodium: 140 mmol/L (ref 135–145)

## 2020-03-15 LAB — CBC
HCT: 33 % — ABNORMAL LOW (ref 39.0–52.0)
Hemoglobin: 10.4 g/dL — ABNORMAL LOW (ref 13.0–17.0)
MCH: 27.7 pg (ref 26.0–34.0)
MCHC: 31.5 g/dL (ref 30.0–36.0)
MCV: 88 fL (ref 80.0–100.0)
Platelets: 134 10*3/uL — ABNORMAL LOW (ref 150–400)
RBC: 3.75 MIL/uL — ABNORMAL LOW (ref 4.22–5.81)
RDW: 15.2 % (ref 11.5–15.5)
WBC: 7.5 10*3/uL (ref 4.0–10.5)
nRBC: 0 % (ref 0.0–0.2)

## 2020-03-15 LAB — ABO/RH: ABO/RH(D): AB POS

## 2020-03-15 LAB — MAGNESIUM: Magnesium: 1.9 mg/dL (ref 1.7–2.4)

## 2020-03-15 MED ORDER — LIDOCAINE HCL (PF) 1 % IJ SOLN
INTRAMUSCULAR | Status: DC | PRN
  Administered 2020-03-15: 10 mL

## 2020-03-15 MED ORDER — LIDOCAINE HCL 1 % IJ SOLN
INTRAMUSCULAR | Status: AC
  Filled 2020-03-15: qty 20

## 2020-03-15 MED ORDER — CLOPIDOGREL BISULFATE 75 MG PO TABS: 75.0000 mg | ORAL_TABLET | Freq: Every day | ORAL | 1 refills | Status: DC

## 2020-03-15 MED ORDER — LOSARTAN POTASSIUM 25 MG PO TABS
25.0000 mg | ORAL_TABLET | Freq: Every day | ORAL | Status: DC
  Administered 2020-03-15: 25 mg via ORAL
  Filled 2020-03-15 (×2): qty 1

## 2020-03-15 MED ORDER — FUROSEMIDE 20 MG PO TABS
20.0000 mg | ORAL_TABLET | Freq: Every day | ORAL | Status: DC
  Administered 2020-03-15: 20 mg via ORAL
  Filled 2020-03-15: qty 1

## 2020-03-15 MED FILL — Lidocaine HCl Local Inj 1%: INTRAMUSCULAR | Qty: 20 | Status: AC

## 2020-03-15 MED FILL — Midazolam HCl Inj 5 MG/5ML (Base Equivalent): INTRAMUSCULAR | Qty: 2 | Status: AC

## 2020-03-15 MED FILL — Heparin Sod (Porcine)-NaCl IV Soln 1000 Unit/500ML-0.9%: INTRAVENOUS | Qty: 500 | Status: AC

## 2020-03-15 NOTE — Progress Notes (Signed)
Mobility Specialist - Progress Note   03/15/20 1412  Mobility  Activity Refused mobility   Pt refused mobility, stating that he wants to prepare for his d/c.  Pricilla Handler Mobility Specialist Mobility Specialist Phone: [phone]

## 2020-03-15 NOTE — Progress Notes (Signed)
  Echocardiogram 2D Echocardiogram has been performed.  Bobbye Charleston 03/15/2020, 10:30 AM

## 2020-03-15 NOTE — Discharge Summary (Addendum)
[facility] VALVE TEAM  Discharge Summary    Patient ID: Cesar Bullock MRN: [id number]; DOB: Apr 16, 1941  Admit date: 03/14/2020 Discharge date: 03/15/2020  Primary Care Provider: Michael Boston, MD  Primary Cardiologist: Glenetta Hew, MD / Dr. Angelena Form & Dr. Cyndia Bent (TAVR)  Discharge Diagnoses    Principal Problem:   S/P TAVR (transcatheter aortic valve replacement) Active Problems:   Acute on chronic systolic heart failure (HCC)   Severe aortic stenosis   HLD (hyperlipidemia)   Hypertension   NICM (nonischemic cardiomyopathy) (HCC)   Pleural effusion, bilateral   Allergies No Known Allergies  Diagnostic Studies/Procedures     TAVR OPERATIVE NOTE Date of Procedure:                03/14/2020  Preoperative Diagnosis:      Severe Aortic Stenosis   Postoperative Diagnosis:    Same   Procedure:        Transcatheter Aortic Valve Replacement - Percutaneous Right Transfemoral Approach             Edwards Sapien 3 Ultra THV (size 26 mm, model # 9750TFX, serial # [id number])              Co-Surgeons:                        Gaye Pollack, MD and Lauree Chandler, MD   Anesthesiologist:                  Laurie Panda, MD  Echocardiographer:              Ena Dawley, MD  Pre-operative Echo Findings: ? Severe aortic stenosis ? Moderate left ventricular systolic dysfunction  Post-operative Echo Findings: ? No paravalvular leak ? Improved left ventricular systolic function  _____________   Echo 03/15/20: complete but pending formal read at the time of discharge    History of Present Illness     Cesar Bullock is a 79 y.o. male with a history of hearing loss, anxiety, HTN, chronic systolic CHF, non-ischemic cardiomyopathyand severe aortic stenosis who presented to [facility] on 03/14/20 for planned TAVR.  He had been seen in 2018 by Dr. Saunders Revel for moderate aortic stenosis but was lost to follow up. Lv function was normal  at that time. He had recent worsening lower extremity edema, cough and dyspnea and was evaluated by Dr. Ellyn Hack. Echo 02/08/20 with LVEF=25-30%, mild MR. The aortic valve is tricuspid with thickened, calcifiied leaflets and limited leaflet excursion. Mean gradient 41 mmHg, peak gradient 62. 74mmHg,  AVA 0.49 cm2. Moderate aortic valve insufficiency. Cardiac cath 02/16/20 with no angiographic evidence of CAD. LE edema improved with diuresis. Of note, pre TAVR CT and CXR showed moderate bilateral pleural effusions.  The patient has been evaluated by the multidisciplinary valve team and felt to have severe, symptomatic aortic stenosis and to be a suitable candidate for TAVR, which was set up for 03/14/20.   Hospital Course     Consultants: IR  Severe AS: s/p successful TAVR with a 26 mm Edwards Sapien 3 Ultra THV via the TF approach on 03/14/20. Post operative echo pending. Groin sites are stable. ECG with sinus brady and 1st deg AV block and LBBB. He has had sinus bradycardia but no high grade heart block. Continue Asprin and started on Plavix 75 mg daily.    Bilateral pleural effusions: s/p thoracentesis yielding 1.1 L of yellow fluid. Post CXR  showed decreased right pleural effusion following thoracentesis. No pneumothorax. Improved aeration right lung base. No change left pleural effusion and left lower lobe atelectasis.  Acute on chronic diastolic CHF: as evidenced by an elevated BNP on pre admission lab work and mild CHF on CXR with bilateral pleural effusions. This has been treated with TAVR. Resumed on home lasix and s/p thoracentesis.  NICM: continue Losartan and Coreg.   HTN: BP elevated. Resumed on home meds including Coreg ( see below).  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 He was also bradycardic after TAVR. Given risk of HAVB after TAVR, will place a 14 day Zio patch prior to discharge.   _____________  Discharge Vitals Blood pressure (!) 162/88, pulse 74, temperature 98  F (36.7 C), temperature source Oral, resp. rate 19, height 5\' 10"  (1.778 m), weight 57.7 kg, SpO2 97 %.  Filed Weights   03/14/20 0729 03/15/20 0500  Weight: 54.9 kg 57.7 kg    GEN: Well nourished, well developed, in no acute distress HEENT: normal Neck: no JVD or masses Cardiac: RRR; no murmurs, rubs, or gallops,no edema  Respiratory:  clear to auscultation bilaterally, normal work of breathing. Decreased breath sounds laterally.  GI: soft, nontender, nondistended, + BS MS: no deformity or atrophy Skin: warm and dry, no rash Neuro:  Alert and Oriented x 3, Strength and sensation are intact Psych: euthymic mood, full affect   Labs & Radiologic Studies    CBC Recent Labs    03/14/20 1213 03/15/20 0347  WBC  --  7.5  HGB 10.9* 10.4*  HCT 32.0* 33.0*  MCV  --  88.0  PLT  --  672*   Basic Metabolic Panel Recent Labs    03/14/20 1213 03/15/20 0347  NA 143 140  K 3.8 3.9  CL 107 107  CO2  --  26  GLUCOSE 107* 84  BUN 16 17  CREATININE 0.90 1.04  CALCIUM  --  9.0  MG  --  1.9   Liver Function Tests No results for input(s): AST, ALT, ALKPHOS, BILITOT, PROT, ALBUMIN in the last 72 hours. No results for input(s): LIPASE, AMYLASE in the last 72 hours. Cardiac Enzymes No results for input(s): CKTOTAL, CKMB, CKMBINDEX, TROPONINI in the last 72 hours. BNP Invalid input(s): POCBNP D-Dimer No results for input(s): DDIMER in the last 72 hours. Hemoglobin A1C No results for input(s): HGBA1C in the last 72 hours. Fasting Lipid Panel No results for input(s): CHOL, HDL, LDLCALC, TRIG, CHOLHDL, LDLDIRECT in the last 72 hours. Thyroid Function Tests No results for input(s): TSH, T4TOTAL, T3FREE, THYROIDAB in the last 72 hours.  Invalid input(s): FREET3 _____________  DG Chest 1 View  Result Date: 03/15/2020 CLINICAL DATA:  Post right thoracentesis 1.1 L. EXAM: CHEST  1 VIEW COMPARISON:  03/10/2020 FINDINGS: Decreased right pleural effusion following thoracentesis. No  pneumothorax. Mild to moderate left pleural effusion unchanged. Left lower lobe atelectasis unchanged. Improved aeration in the right lung base. Heart size and vascularity normal.  TAVR.  Underlying COPD. IMPRESSION: Decreased right pleural effusion following thoracentesis. No pneumothorax. Improved aeration right lung base No change left pleural effusion and left lower lobe atelectasis. Electronically Signed   By: Franchot Gallo M.D.   On: 03/15/2020 13:49   DG Chest 2 View  Result Date: 03/10/2020 CLINICAL DATA:  Preoperative exam.  Aortic stenosis. EXAM: CHEST - 2 VIEW COMPARISON:  02/23/2020 FINDINGS: Mild cardiomegaly, stable. Atherosclerotic calcification of the aortic knob. Moderate bilateral pleural effusions with associated bibasilar  atelectasis, not appreciably changed from prior. No pneumothorax. No acute osseous findings. IMPRESSION: Stable moderate bilateral pleural effusions with associated bibasilar atelectasis. Electronically Signed   By: Davina Poke D.O.   On: 03/10/2020 17:04   CARDIAC CATHETERIZATION  Result Date: 02/16/2020  Hemodynamic findings consistent with moderate pulmonary hypertension.  There is severe aortic valve stenosis.  There is severe left ventricular systolic dysfunction.  The left ventricular ejection fraction is 25-35% by visual estimate.   Severe Aortic Stenosis  Severe Valvular Cardiomyopathy - EF ~25% & CO/CI: 3.25/1.82  Moderate to Severe Pulmonary Hypertension  Angiographically Normal Coronary Arteries Recommendations: The patient be discharged home after bedrest.  He will be scheduled to see Dr. Angelena Form or either Dr. Minna Merritts. Cyndia Bent in the TAVR clinic. Glenetta Hew, MD  CT CORONARY [facility] W/CTA COR W/SCORE Lewanda Rife W/CM &/OR WO/CM  Addendum Date: 02/24/2020   ADDENDUM REPORT: 02/24/2020 13:35 CLINICAL DATA:  79 year old male with severe aortic stenosis being evaluated for a TAVR procedure vs AVR. EXAM: Cardiac TAVR CT TECHNIQUE: The patient was  scanned on a [employer]. A 120 kV retrospective scan was triggered in the descending thoracic aorta at 111 HU's. Gantry rotation speed was 250 msecs and collimation was .6 mm. Carvedilol 3.125 mg PO and no nitro were given. The 3D data set was reconstructed in 5% intervals of the R-R cycle. Systolic and diastolic phases were analyzed on a dedicated work station using MPR, MIP and VRT modes. The patient received 80 cc of contrast. FINDINGS: Aortic Valve: Trileaflet aortic valve with severely calcified leaflets and severely restricted leaflet opening but only minimal calcifications extending into the LVOT. Aortic valve calcium score 4399 consistent with severe aortic stenosis. Aorta: Normal size with mild diffuse atherosclerotic plaque and calcifications. No dissection. Sinotubular Junction: 29.0 x 27.9 mm Ascending Thoracic Aorta: 36.6 x 36.6 mm Aortic Arch: 23.6 x 23.1 mm Descending Thoracic Aorta: 20.4 x 19.9 mm Sinus of Valsalva Measurements: Non-coronary: 32 mm Right -coronary: 31 mm Left -coronary: 32 mm Coronary Artery Height above Annulus: Left Main: 13 mm Right Coronary: 19 mm Virtual Basal Annulus Measurements: Maximum/Minimum Diameter: 28.0 x 22.0 mm Mean Diameter: 24.8 mm Perimeter: 80.1 mm Area: 483 mm2 Optimum Fluoroscopic Angle for Delivery: LAO 11 CAU 14 Mildly dilated pulmonary artery measuring 31 mm. IMPRESSION: 1. Trileaflet aortic valve with severely calcified leaflets and severely restricted leaflet opening but only minimal calcifications extending into the LVOT. Aortic valve calcium score 4399 consistent with severe aortic stenosis. Annular measurements suitable for delivery of a 26 mm Edwards-SAPIEN 3 valve. 2. Sufficient coronary to annulus distance. 3. Optimum Fluoroscopic Angle for Delivery:  LAO 11 CAU 14. 4. No thrombus in the left atrial appendage. Electronically Signed   By: Ena Dawley   On: 02/24/2020 13:35   Result Date: 02/24/2020 EXAM: OVER-READ INTERPRETATION  CT  CHEST The following report is an over-read performed by radiologist Dr. Vinnie Langton of [facility], [place] on 02/23/2020. This over-read does not include interpretation of cardiac or coronary anatomy or pathology. The coronary calcium score/coronary CTA interpretation by the cardiologist is attached. COMPARISON:  No priors. FINDINGS: Extracardiac findings will be described separately under dictation for contemporaneously obtained CTA chest, abdomen and pelvis. IMPRESSION: Please see separate dictation for contemporaneously obtained CTA chest, abdomen and pelvis dated 02/23/2020 for full description of relevant extracardiac findings. Electronically Signed: By: Vinnie Langton M.D. On: 02/23/2020 12:03   CT ANGIO CHEST AORTA W/CM & OR WO/CM  Result Date: 02/23/2020 CLINICAL DATA:  79 year old  male with history of severe aortic stenosis. Preprocedural study prior to potential transcatheter aortic valve replacement (TAVR) procedure. EXAM: CT ANGIOGRAPHY CHEST, ABDOMEN AND PELVIS TECHNIQUE: Non-contrast CT of the chest was initially obtained. Multidetector CT imaging through the chest, abdomen and pelvis was performed using the standard protocol during bolus administration of intravenous contrast. Multiplanar reconstructed images and MIPs were obtained and reviewed to evaluate the vascular anatomy. CONTRAST:  179mL OMNIPAQUE IOHEXOL 350 MG/ML SOLN COMPARISON:  No priors. FINDINGS: CTA CHEST FINDINGS Cardiovascular: Heart size is mildly enlarged. There is no significant pericardial fluid, thickening or pericardial calcification. There is aortic atherosclerosis, as well as atherosclerosis of the great vessels of the mediastinum and the coronary arteries, including calcified atherosclerotic plaque in the left anterior descending coronary artery. Severe thickening calcification of the aortic valve. Calcifications of the mitral annulus. Mediastinum/Lymph Nodes: No pathologically enlarged mediastinal or hilar  lymph nodes. Esophagus is unremarkable in appearance. No axillary lymphadenopathy. Lungs/Pleura: Moderate bilateral pleural effusions lying dependently with extensive passive subsegmental atelectasis in the lower lobes of the lungs bilaterally. No confluent consolidative airspace disease. No suspicious appearing pulmonary nodules or masses are noted. Musculoskeletal/Soft Tissues: There are no aggressive appearing lytic or blastic lesions noted in the visualized portions of the skeleton. CTA ABDOMEN AND PELVIS FINDINGS Hepatobiliary: Low-attenuation lesions in the liver compatible with simple cysts, largest of which is in segment 2 measuring 1.6 x 1.4 cm. No other suspicious appearing cystic or solid hepatic lesions. Gallbladder is normal in appearance. Pancreas: No pancreatic mass. No pancreatic ductal dilatation. No pancreatic or peripancreatic fluid collections or inflammatory changes. Spleen: Unremarkable. Adrenals/Urinary Tract: Mild adreniform thickening of the adrenal glands bilaterally, favored to reflect adrenal hyperplasia. Bilateral kidneys are normal in appearance. No hydroureteronephrosis. Urinary bladder is unremarkable in appearance. Stomach/Bowel: Normal appearance of the stomach. No pathologic dilatation of small bowel or colon. Numerous colonic diverticulae are noted, particularly in the sigmoid colon, without definite surrounding inflammatory changes to suggest an acute diverticulitis at this time. Normal appendix. Vascular/Lymphatic: Aortic atherosclerosis, without evidence of aneurysm or dissection in the abdominal or pelvic vasculature. No lymphadenopathy noted in the abdomen or pelvis. Reproductive: Prostate gland and seminal vesicles are unremarkable in appearance. Other: No significant volume of ascites.  No pneumoperitoneum. Musculoskeletal: There are no aggressive appearing lytic or blastic lesions noted in the visualized portions of the skeleton. VASCULAR MEASUREMENTS PERTINENT TO TAVR:  AORTA: Minimal Aortic Diameter-13 x 14 mm Severity of Aortic Calcification-moderate to severe RIGHT PELVIS: Right Common Iliac Artery - Minimal Diameter-7.5 x 6.8 mm Tortuosity-moderate Calcification-moderate Right External Iliac Artery - Minimal Diameter-6.9 x 6.3 mm Tortuosity-moderate Calcification-none Right Common Femoral Artery - Minimal Diameter-8.7 x 5.6 mm Tortuosity-mild Calcification-mild LEFT PELVIS: Left Common Iliac Artery - Minimal Diameter-8.1 x 7.8 mm Tortuosity-mild Calcification-moderate Left External Iliac Artery - Minimal Diameter-6.6 x 6.9 mm Tortuosity-moderate Calcification-none Left Common Femoral Artery - Minimal Diameter-7.3 x 7.5 mm Tortuosity-mild Calcification-mild Review of the MIP images confirms the above findings. IMPRESSION: 1. Vascular findings and measurements pertinent to potential TAVR procedure, as detailed above. 2. Severe thickening calcification of the aortic valve, compatible with the reported clinical history of severe aortic stenosis. 3. Aortic atherosclerosis, in addition to left anterior descending coronary artery disease. Assessment for potential risk factor modification, dietary therapy or pharmacologic therapy may be warranted, if clinically indicated. 4. Moderate bilateral pleural effusions lying dependently with extensive passive subsegmental atelectasis in the lower lobes of the lungs bilaterally. 5. Colonic diverticulosis without evidence of acute diverticulitis at this time. 6.  Additional incidental findings, as above Electronically Signed   By: Vinnie Langton M.D.   On: 02/23/2020 14:56   ECHO TEE  Result Date: 02/21/2020    TRANSESOPHOGEAL ECHO REPORT   Patient Name:   Cesar Bullock Mainer Date of Exam: 02/16/2020 Medical Rec #:  [id number]       Height:       70.0 in Accession #:    [id number]      Weight:       138.2 lb Date of Birth:  09/20/1940       BSA:          1.784 m Patient Age:    58 years        BP:           146/91 mmHg Patient Gender: M                HR:           68 bpm. Exam Location:  Inpatient Procedure: Transesophageal Echo, 3D Echo, Color Doppler, Cardiac Doppler and            Saline Contrast Bubble Study Indications:     Aortic Stenosis i35.0  History:         Patient has prior history of Echocardiogram examinations, most                  recent 02/08/2020. Risk Factors:Dyslipidemia and COVID 02/21.  Sonographer:     Raquel Sarna Senior RDCS Referring Phys:  [id number] Elouise Munroe Diagnosing Phys: Cherlynn Kaiser MD PROCEDURE: After discussion of the risks and benefits of a TEE, an informed consent was obtained from the patient. TEE procedure time was 39 minutes. The transesophogeal probe was passed without difficulty through the esophogus of the patient. Imaged were obtained with the patient in a left lateral decubitus position. Local oropharyngeal anesthetic was provided with Cetacaine. Sedation performed by different physician. The patient was monitored while under deep sedation. Anesthestetic sedation was provided intravenously by Anesthesiology: 161mg  of Propofol. Image quality was excellent. The patient's vital signs; including heart rate, blood pressure, and oxygen saturation; remained stable throughout the procedure. The patient developed no complications during the procedure. IMPRESSIONS  1. The aortic valve is abnormal. Aortic valve regurgitation is mild to moderate. Severe aortic valve stenosis. Aortic valve area, by VTI measures 0.40 cm. Aortic valve mean gradient measures 53.0 mmHg. Aortic valve Vmax measures 4.44 m/s.  2. Left ventricular ejection fraction, by estimation, is 20 to 25%. The left ventricle has severely decreased function. The left ventricle demonstrates global hypokinesis. The left ventricular internal cavity size was mildly dilated. There is mild left ventricular hypertrophy.  3. Right ventricular systolic function mild-moderately reduced. The right ventricular size is mildly enlarged.  4. Left atrial size was mildly  dilated. No left atrial/left atrial appendage thrombus was detected.  5. Biatrial size was mildly dilated.  6. The mitral valve is degenerative. Moderate mitral valve regurgitation. No evidence of mitral stenosis.  7. There is Moderate (Grade III) atheroma plaque involving the transverse and descending aorta.  8. Large pleural effusion. FINDINGS  Left Ventricle: Left ventricular ejection fraction, by estimation, is 20 to 25%. The left ventricle has severely decreased function. The left ventricle demonstrates global hypokinesis. The left ventricular internal cavity size was mildly dilated. There is mild left ventricular hypertrophy. Right Ventricle: The right ventricular size is mildly enlarged. No increase in right ventricular wall thickness. Right ventricular systolic function mild-moderately reduced. Left Atrium: Left atrial size  was mildly dilated. No left atrial/left atrial appendage thrombus was detected. Right Atrium: Right atrial size was mildly dilated. Pericardium: A small pericardial effusion is present. Mitral Valve: The mitral valve is degenerative in appearance. Moderate mitral valve regurgitation. No evidence of mitral valve stenosis. Tricuspid Valve: The tricuspid valve is normal in structure. Tricuspid valve regurgitation is mild. Aortic Valve: Unable to obtain accurate vena contracta due to shadowing from calcium. The aortic valve is abnormal. Aortic valve regurgitation is mild to moderate. Aortic regurgitation PHT measures 479 msec. Severe aortic stenosis is present. Aortic valve mean gradient measures 53.0 mmHg. Aortic valve peak gradient measures 78.9 mmHg. Aortic valve area, by VTI measures 0.40 cm. Pulmonic Valve: The pulmonic valve was normal in structure. Pulmonic valve regurgitation is mild. Aorta: The aortic root and ascending aorta are structurally normal, with no evidence of dilitation. There is moderate (Grade III) atheroma plaque involving the transverse and descending aorta.  IAS/Shunts: No atrial level shunt detected by color flow Doppler. Agitated saline contrast was given intravenously to evaluate for intracardiac shunting. Additional Comments: There is a large pleural effusion.  LEFT VENTRICLE PLAX 2D LVOT diam:     2.20 cm LV SV:         47 LV SV Index:   26 LVOT Area:     3.80 cm  AORTIC VALVE AV Area (Vmax):    0.35 cm AV Area (Vmean):   0.33 cm AV Area (VTI):     0.40 cm AV Vmax:           444.00 cm/s AV Vmean:          346.000 cm/s AV VTI:            1.160 m AV Peak Grad:      78.9 mmHg AV Mean Grad:      53.0 mmHg LVOT Vmax:         41.00 cm/s LVOT Vmean:        29.600 cm/s LVOT VTI:          0.123 m LVOT/AV VTI ratio: 0.11 AI PHT:            479 msec MR Peak grad:    122.1 mmHg MR Mean grad:    75.0 mmHg   SHUNTS MR Vmax:         552.50 cm/s Systemic VTI:  0.12 m MR Vmean:        403.5 cm/s  Systemic Diam: 2.20 cm MR PISA Nyquist: 3.8 m/s MR PISA:         2.04 cm MR PISA Eff ROA: 14 mm MR PISA Radius:  0.57 cm Cherlynn Kaiser MD Electronically signed by Cherlynn Kaiser MD Signature Date/Time: 02/21/2020/3:09:05 PM    Final    VAS US CAROTID  Result Date: 03/10/2020 Carotid Arterial Duplex Study Indications:       Pre TAVR. Risk Factors:      Hypertension. Comparison Study:  no prior Performing Technologist: Abram Sander RVS  Examination Guidelines: A complete evaluation includes B-mode imaging, spectral Doppler, color Doppler, and power Doppler as needed of all accessible portions of each vessel. Bilateral testing is considered an integral part of a complete examination. Limited examinations for reoccurring indications may be performed as noted.  Right Carotid Findings: +----------+--------+--------+--------+------------------+--------+           PSV cm/sEDV cm/sStenosisPlaque DescriptionComments +----------+--------+--------+--------+------------------+--------+ CCA Prox  40      11              heterogenous                +----------+--------+--------+--------+------------------+--------+  CCA Distal49      15              heterogenous               +----------+--------+--------+--------+------------------+--------+ ICA Prox  71      26      1-39%   heterogenous               +----------+--------+--------+--------+------------------+--------+ ICA Distal71      67                                         +----------+--------+--------+--------+------------------+--------+ ECA       45                                                 +----------+--------+--------+--------+------------------+--------+ +----------+--------+-------+--------+-------------------+           PSV cm/sEDV cmsDescribeArm Pressure (mmHG) +----------+--------+-------+--------+-------------------+ [id number]                                         +----------+--------+-------+--------+-------------------+ +---------+--------+--+--------+--+---------+ VertebralPSV cm/s30EDV cm/s12Antegrade +---------+--------+--+--------+--+---------+  Left Carotid Findings: +----------+--------+--------+--------+------------------+--------+           PSV cm/sEDV cm/sStenosisPlaque DescriptionComments +----------+--------+--------+--------+------------------+--------+ CCA Prox  53      13              heterogenous               +----------+--------+--------+--------+------------------+--------+ CCA Distal54      12              heterogenous               +----------+--------+--------+--------+------------------+--------+ ICA Prox  55      19      1-39%   heterogenous               +----------+--------+--------+--------+------------------+--------+ ICA Distal44      17                                         +----------+--------+--------+--------+------------------+--------+ ECA       34                                                  +----------+--------+--------+--------+------------------+--------+ +----------+--------+--------+--------+-------------------+           PSV cm/sEDV cm/sDescribeArm Pressure (mmHG) +----------+--------+--------+--------+-------------------+ [id number]                                          +----------+--------+--------+--------+-------------------+ +---------+--------+--+--------+--+---------+ VertebralPSV cm/s63EDV cm/s15Antegrade +---------+--------+--+--------+--+---------+   Summary: Right Carotid: Velocities in the right ICA are consistent with a 1-39% stenosis. Left Carotid: Velocities in the left ICA are consistent with a 1-39% stenosis. Vertebrals: Bilateral vertebral arteries demonstrate antegrade flow. *See table(s) above for measurements and observations.  Electronically signed by Monica Martinez MD on 03/10/2020 at 4:44:18 PM.  Final    ECHOCARDIOGRAM LIMITED  Result Date: 03/15/2020    ECHOCARDIOGRAM REPORT   Patient Name:   Cesar Bullock Date of Exam: 03/15/2020 Medical Rec #:  [id number]       Height:       70.0 in Accession #:    [id number]      Weight:       127.2 lb Date of Birth:  [birth year]       BSA:          1.722 m Patient Age:    62 years        BP:           162/88 mmHg Patient Gender: M               HR:           67 bpm. Exam Location:  Inpatient Procedure: Limited Echo, Cardiac Doppler and Color Doppler Indications:    I35.0 Nonrheumatic aortic (valve) stenosis  History:        Patient has prior history of Echocardiogram examinations, most                 recent 03/14/2020. Cardiomyopathy, Aortic Valve Disease,                 Signs/Symptoms:Dyspnea; Risk Factors:Hypertension and                 Dyslipidemia. Post TAVR procedure. Severe aortic stenosis.                 Aortic Valve: 23 mm Sapien prosthetic, stented (TAVR) valve is                 present in the aortic position. Procedure Date: 03/14/2020.  Sonographer:    Roseanna Rainbow RDCS Referring Phys: [id number]  [place]  1. Day 1 post TAVR with normal transaortic gradient (mean 11 mmHg), no paravalvular leak.  2. There is large left pleural effusion with lung atelectasis.  3. Left ventricular ejection fraction, by estimation, is 40 to 45%. The left ventricle has mildly decreased function. The left ventricle demonstrates global hypokinesis. There is moderate concentric left ventricular hypertrophy. Left ventricular diastolic parameters are consistent with Grade II diastolic dysfunction (pseudonormalization). Elevated left atrial pressure.  4. Right ventricular systolic function is normal. The right ventricular size is normal.  5. Left atrial size was severely dilated.  6. Right atrial size was mildly dilated.  7. Large pleural effusion in the left lateral region.  8. The mitral valve is normal in structure. Mild mitral valve regurgitation. No evidence of mitral stenosis.  9. The aortic valve has been repaired/replaced. Aortic valve regurgitation is not visualized. No aortic stenosis is present. There is a 23 mm Sapien prosthetic (TAVR) valve present in the aortic position. Procedure Date: 03/14/2020. Echo findings are consistent with normal structure and function of the aortic valve prosthesis. Aortic valve mean gradient measures 12.0 mmHg. 10. The inferior vena cava is normal in size with greater than 50% respiratory variability, suggesting right atrial pressure of 3 mmHg. FINDINGS  Left Ventricle: Left ventricular ejection fraction, by estimation, is 40 to 45%. The left ventricle has mildly decreased function. The left ventricle demonstrates global hypokinesis. The left ventricular internal cavity size was normal in size. There is  moderate concentric left ventricular hypertrophy. Left ventricular diastolic parameters are consistent with Grade II diastolic dysfunction (pseudonormalization). Elevated left atrial pressure. Right Ventricle: The right ventricular size is normal. No increase  in right  ventricular wall thickness. Right ventricular systolic function is normal. Left Atrium: Left atrial size was severely dilated. Right Atrium: Right atrial size was mildly dilated. Pericardium: There is no evidence of pericardial effusion. Mitral Valve: The mitral valve is normal in structure. Mild mitral valve regurgitation. No evidence of mitral valve stenosis. Tricuspid Valve: The tricuspid valve is normal in structure. Tricuspid valve regurgitation is mild . No evidence of tricuspid stenosis. Aortic Valve: No paravalvular leak. The aortic valve has been repaired/replaced. Aortic valve regurgitation is not visualized. No aortic stenosis is present. Aortic valve mean gradient measures 12.0 mmHg. Aortic valve peak gradient measures 19.6 mmHg. Aortic valve area, by VTI measures 3.47 cm. There is a 23 mm Sapien prosthetic, stented (TAVR) valve present in the aortic position. Procedure Date: 03/14/2020. Echo findings are consistent with normal structure and function of the aortic valve prosthesis. Pulmonic Valve: The pulmonic valve was normal in structure. Pulmonic valve regurgitation is not visualized. No evidence of pulmonic stenosis. Aorta: The aortic root is normal in size and structure. Venous: The inferior vena cava is normal in size with greater than 50% respiratory variability, suggesting right atrial pressure of 3 mmHg. IAS/Shunts: No atrial level shunt detected by color flow Doppler. Additional Comments: There is a large pleural effusion in the left lateral region.  LEFT VENTRICLE PLAX 2D LVIDd:         4.10 cm LVIDs:         3.10 cm LV PW:         1.80 cm LV IVS:        1.50 cm LVOT diam:     2.30 cm LV SV:         150 LV SV Index:   87 LVOT Area:     4.15 cm  LV Volumes (MOD) LV vol d, MOD A2C: 125.0 ml LV vol d, MOD A4C: 98.2 ml LV vol s, MOD A2C: 73.0 ml LV vol s, MOD A4C: 48.1 ml LV SV MOD A2C:     52.0 ml LV SV MOD A4C:     98.2 ml LV SV MOD BP:      53.6 ml IVC IVC diam: 1.90 cm LEFT ATRIUM          Index LA diam:    5.10 cm 2.96 cm/m  AORTIC VALVE AV Area (Vmax):    3.55 cm AV Area (Vmean):   3.52 cm AV Area (VTI):     3.47 cm AV Vmax:           221.33 cm/s AV Vmean:          160.333 cm/s AV VTI:            0.434 m AV Peak Grad:      19.6 mmHg AV Mean Grad:      12.0 mmHg LVOT Vmax:         189.00 cm/s LVOT Vmean:        136.000 cm/s LVOT VTI:          0.362 m LVOT/AV VTI ratio: 0.83  AORTA Ao Root diam: 3.50 cm Ao Asc diam:  3.20 cm MITRAL VALVE MV Area (PHT): 4.31 cm     SHUNTS MV Decel Time: 176 msec     Systemic VTI:  0.36 m MV E velocity: 127.00 cm/s  Systemic Diam: 2.30 cm MV A velocity: 54.40 cm/s MV E/A ratio:  2.33 Ena Dawley MD Electronically signed by Ena Dawley MD Signature Date/Time: 03/15/2020/12:37:45 PM  Final    ECHOCARDIOGRAM LIMITED  Result Date: 03/14/2020    ECHOCARDIOGRAM LIMITED REPORT   Patient Name:   Cesar Bullock Date of Exam: 03/14/2020 Medical Rec #:  [id number]       Height:       70.0 in Accession #:    [id number]      Weight:       129.1 lb Date of Birth:  July 13, 1940       BSA:          1.733 m Patient Age:    5 years        BP:           169/70 mmHg Patient Gender: M               HR:           99 bpm. Exam Location:  Inpatient Procedure: Limited Echo, Color Doppler and Cardiac Doppler Indications:     Aortic Valve Stenosis i35.0  History:         Patient has prior history of Echocardiogram examinations, most                  recent 02/21/2020. Risk Factors:Dyslipidemia and COVID+ 08/2019.  Sonographer:     Raquel Sarna Senior RDCS Referring Phys:  [place] Diagnosing Phys: Ena Dawley MD  Sonographer Comments: TAVR Procedure with 44mm Edwards Ultra Sapien Bioprosthetic IMPRESSIONS  1. This was a periprocedular echocardiogram during TAVR procedure. A 26 mm Edwards-SAPIEN 3 valve Ultra was successfully placed in the aortic position. Transaortic gradients improved from 65/40 mmHg to 9/6 mmHg postdeployment. No paravalvular leak was seen. LVEF  has improved from 25-30% to 35-40%.  2. Left ventricular ejection fraction, by estimation, is 25 to 30%. The left ventricle has severely decreased function. There is moderate concentric left ventricular hypertrophy.  3. Right ventricular systolic function is mildly reduced.  4. Left atrial size was moderately dilated.  5. Right atrial size was mildly dilated.  6. Large pleural effusion in the left lateral region.  7. Mild to moderate mitral valve regurgitation.  8. The aortic valve is tricuspid. Aortic valve regurgitation is mild. Severe aortic valve stenosis. Aortic valve mean gradient measures 40.0 mmHg. FINDINGS  Left Ventricle: Left ventricular ejection fraction, by estimation, is 25 to 30%. The left ventricle has severely decreased function. There is moderate concentric left ventricular hypertrophy. Right Ventricle: Right ventricular systolic function is mildly reduced. Left Atrium: Left atrial size was moderately dilated. Right Atrium: Right atrial size was mildly dilated. Mitral Valve: Mild to moderate mitral valve regurgitation. Aortic Valve: The aortic valve is tricuspid. Aortic valve regurgitation is mild. Severe aortic stenosis is present. Aortic valve mean gradient measures 40.0 mmHg. Aortic valve peak gradient measures 9.1 mmHg. Aortic valve area, by VTI measures 1.39 cm. Additional Comments: There is a large pleural effusion in the left lateral region. LEFT VENTRICLE PLAX 2D LVOT diam:     2.00 cm LV SV:         53 LV SV Index:   31 LVOT Area:     3.14 cm  AORTIC VALVE AV Area (Vmax):    1.49 cm AV Area (Vmean):   1.44 cm AV Area (VTI):     1.39 cm AV Vmax:           151.00 cm/s AV Vmean:          114.000 cm/s AV VTI:  0.380 m AV Peak Grad:      9.1 mmHg AV Mean Grad:      40.0 mmHg LVOT Vmax:         71.45 cm/s LVOT Vmean:        52.100 cm/s LVOT VTI:          0.168 m LVOT/AV VTI ratio: 0.44  SHUNTS Systemic VTI:  0.17 m Systemic Diam: 2.00 cm Ena Dawley MD Electronically signed by  Ena Dawley MD Signature Date/Time: 03/14/2020/12:18:27 PM    Final    Structural Heart Procedure  Result Date: 03/14/2020 See surgical note for result.  CT Angio Abd/Pel w/ and/or w/o  Result Date: 02/23/2020 CLINICAL DATA:  79 year old male with history of severe aortic stenosis. Preprocedural study prior to potential transcatheter aortic valve replacement (TAVR) procedure. EXAM: CT ANGIOGRAPHY CHEST, ABDOMEN AND PELVIS TECHNIQUE: Non-contrast CT of the chest was initially obtained. Multidetector CT imaging through the chest, abdomen and pelvis was performed using the standard protocol during bolus administration of intravenous contrast. Multiplanar reconstructed images and MIPs were obtained and reviewed to evaluate the vascular anatomy. CONTRAST:  121mL OMNIPAQUE IOHEXOL 350 MG/ML SOLN COMPARISON:  No priors. FINDINGS: CTA CHEST FINDINGS Cardiovascular: Heart size is mildly enlarged. There is no significant pericardial fluid, thickening or pericardial calcification. There is aortic atherosclerosis, as well as atherosclerosis of the great vessels of the mediastinum and the coronary arteries, including calcified atherosclerotic plaque in the left anterior descending coronary artery. Severe thickening calcification of the aortic valve. Calcifications of the mitral annulus. Mediastinum/Lymph Nodes: No pathologically enlarged mediastinal or hilar lymph nodes. Esophagus is unremarkable in appearance. No axillary lymphadenopathy. Lungs/Pleura: Moderate bilateral pleural effusions lying dependently with extensive passive subsegmental atelectasis in the lower lobes of the lungs bilaterally. No confluent consolidative airspace disease. No suspicious appearing pulmonary nodules or masses are noted. Musculoskeletal/Soft Tissues: There are no aggressive appearing lytic or blastic lesions noted in the visualized portions of the skeleton. CTA ABDOMEN AND PELVIS FINDINGS Hepatobiliary: Low-attenuation lesions in the  liver compatible with simple cysts, largest of which is in segment 2 measuring 1.6 x 1.4 cm. No other suspicious appearing cystic or solid hepatic lesions. Gallbladder is normal in appearance. Pancreas: No pancreatic mass. No pancreatic ductal dilatation. No pancreatic or peripancreatic fluid collections or inflammatory changes. Spleen: Unremarkable. Adrenals/Urinary Tract: Mild adreniform thickening of the adrenal glands bilaterally, favored to reflect adrenal hyperplasia. Bilateral kidneys are normal in appearance. No hydroureteronephrosis. Urinary bladder is unremarkable in appearance. Stomach/Bowel: Normal appearance of the stomach. No pathologic dilatation of small bowel or colon. Numerous colonic diverticulae are noted, particularly in the sigmoid colon, without definite surrounding inflammatory changes to suggest an acute diverticulitis at this time. Normal appendix. Vascular/Lymphatic: Aortic atherosclerosis, without evidence of aneurysm or dissection in the abdominal or pelvic vasculature. No lymphadenopathy noted in the abdomen or pelvis. Reproductive: Prostate gland and seminal vesicles are unremarkable in appearance. Other: No significant volume of ascites.  No pneumoperitoneum. Musculoskeletal: There are no aggressive appearing lytic or blastic lesions noted in the visualized portions of the skeleton. VASCULAR MEASUREMENTS PERTINENT TO TAVR: AORTA: Minimal Aortic Diameter-13 x 14 mm Severity of Aortic Calcification-moderate to severe RIGHT PELVIS: Right Common Iliac Artery - Minimal Diameter-7.5 x 6.8 mm Tortuosity-moderate Calcification-moderate Right External Iliac Artery - Minimal Diameter-6.9 x 6.3 mm Tortuosity-moderate Calcification-none Right Common Femoral Artery - Minimal Diameter-8.7 x 5.6 mm Tortuosity-mild Calcification-mild LEFT PELVIS: Left Common Iliac Artery - Minimal Diameter-8.1 x 7.8 mm Tortuosity-mild Calcification-moderate Left External Iliac Artery - Minimal  Diameter-6.6 x 6.9 mm  Tortuosity-moderate Calcification-none Left Common Femoral Artery - Minimal Diameter-7.3 x 7.5 mm Tortuosity-mild Calcification-mild Review of the MIP images confirms the above findings. IMPRESSION: 1. Vascular findings and measurements pertinent to potential TAVR procedure, as detailed above. 2. Severe thickening calcification of the aortic valve, compatible with the reported clinical history of severe aortic stenosis. 3. Aortic atherosclerosis, in addition to left anterior descending coronary artery disease. Assessment for potential risk factor modification, dietary therapy or pharmacologic therapy may be warranted, if clinically indicated. 4. Moderate bilateral pleural effusions lying dependently with extensive passive subsegmental atelectasis in the lower lobes of the lungs bilaterally. 5. Colonic diverticulosis without evidence of acute diverticulitis at this time. 6. Additional incidental findings, as above Electronically Signed   By: Vinnie Langton M.D.   On: 02/23/2020 14:56   Disposition   Pt is being discharged home today in good condition.  Follow-up Plans & Appointments     Follow-up Information    Eileen Stanford, PA-C. Go on 03/22/2020.   Specialties: Cardiology, Radiology Why: @ 2:30 pm Contact information: [address] [place] [address] [phone]                Discharge Medications   Allergies as of 03/15/2020   No Known Allergies     Medication List    TAKE these medications   aspirin EC 81 MG tablet Take 81 mg by mouth daily. Swallow whole.   carvedilol 3.125 MG tablet Commonly known as: COREG Take 1 tablet (3.125 mg total) by mouth 2 (two) times daily.   clopidogrel 75 MG tablet Commonly known as: PLAVIX Take 1 tablet (75 mg total) by mouth daily with breakfast.   furosemide 20 MG tablet Commonly known as: Lasix Take 1 tablet (20 mg total) by mouth daily.   losartan 25 MG tablet Commonly known as: COZAAR Take 1 tablet  (25 mg total) by mouth daily.        Outstanding Labs/Studies   none  Duration of Discharge Encounter   Greater than 30 minutes including physician time.  Signed, Angelena Form, PA-C 03/15/2020, 2:10 PM [phone]  I have personally seen and examined this patient. I agree with the assessment and plan as outlined above.  He is doing well post TAVR. He is also s/p thoracentesis today. Echo post TAVR has improved with LVEF=40-45%. Mean gradient of 11 mmHg expected. BP is stable. Baseline LBBB. 14 day Zio monitor at discharge. Discharge today.   Lauree Chandler 03/15/2020 5:55 PM

## 2020-03-15 NOTE — Progress Notes (Signed)
Pt discharged home with daughter. IVs and telemetry box removed. Pt received discharge instructions and all questions were answered. Pt left with all of his belongings. Pt discharged via wheelchair and was accompanied by a Wilfrid Lund.

## 2020-03-15 NOTE — Progress Notes (Signed)
Pt ambulated well earlier this am. Declined ambulating again at this time. Discussed restrictions, walking at home, CRPII, low sodium diet, daily wts, and signs of fluid. Pt very receptive, keeps a log of BPs and weights in his phone. Received brochure for CRPII and will call if he is interested. [place], ACSM 8:33 AM 03/15/2020

## 2020-03-15 NOTE — Progress Notes (Signed)
Zio patch placed onto patient.  All instructions and information reviewed with patient, they verbalize understanding with no questions. 

## 2020-03-15 NOTE — Procedures (Signed)
Ultrasound-guided  therapeutic right thoracentesis performed yielding 1.1 liters of yellow fluid. No immediate complications. Follow-up chest x-ray pending. EBL none.

## 2020-03-16 ENCOUNTER — Telehealth: Payer: Self-pay | Admitting: Physician Assistant

## 2020-03-16 DIAGNOSIS — I440 Atrioventricular block, first degree: Secondary | ICD-10-CM | POA: Diagnosis not present

## 2020-03-16 NOTE — Telephone Encounter (Signed)
  [place] VALVE TEAM   Patient contacted regarding discharge from [facility] on 03/15/20  Patient understands to follow up with provider Nell Range on 9/22 at [place].  Patient understands discharge instructions? yes Patient understands medications and regimen? yes Patient understands to bring all medications to this visit? yes  Angelena Form PA-C  [facility]

## 2020-03-17 DIAGNOSIS — Z952 Presence of prosthetic heart valve: Secondary | ICD-10-CM | POA: Diagnosis not present

## 2020-03-17 DIAGNOSIS — I5022 Chronic systolic (congestive) heart failure: Secondary | ICD-10-CM | POA: Diagnosis not present

## 2020-03-17 DIAGNOSIS — I10 Essential (primary) hypertension: Secondary | ICD-10-CM | POA: Diagnosis not present

## 2020-03-17 DIAGNOSIS — R634 Abnormal weight loss: Secondary | ICD-10-CM | POA: Diagnosis not present

## 2020-03-17 DIAGNOSIS — I428 Other cardiomyopathies: Secondary | ICD-10-CM | POA: Diagnosis not present

## 2020-03-17 DIAGNOSIS — I700 Atherosclerosis of aorta: Secondary | ICD-10-CM | POA: Diagnosis not present

## 2020-03-17 DIAGNOSIS — E785 Hyperlipidemia, unspecified: Secondary | ICD-10-CM | POA: Diagnosis not present

## 2020-03-20 LAB — ECHOCARDIOGRAM LIMITED
AR max vel: 3.55 cm2
AV Area VTI: 3.47 cm2
AV Area mean vel: 3.52 cm2
AV Mean grad: 12 mmHg
AV Peak grad: 19.6 mmHg
Ao pk vel: 2.21 m/s
Area-P 1/2: 4.31 cm2
Calc EF: 46.2 %
Height: 70 in
S' Lateral: 3.1 cm
Single Plane A2C EF: 41.6 %
Single Plane A4C EF: 51 %
Weight: 2035.29 oz

## 2020-03-22 ENCOUNTER — Ambulatory Visit (INDEPENDENT_AMBULATORY_CARE_PROVIDER_SITE_OTHER): Payer: Medicare Other | Admitting: Physician Assistant

## 2020-03-22 ENCOUNTER — Other Ambulatory Visit: Payer: Self-pay

## 2020-03-22 ENCOUNTER — Encounter: Payer: Self-pay | Admitting: Physician Assistant

## 2020-03-22 VITALS — BP 132/76 | HR 65 | Ht 70.0 in | Wt 127.6 lb

## 2020-03-22 DIAGNOSIS — I428 Other cardiomyopathies: Secondary | ICD-10-CM | POA: Diagnosis not present

## 2020-03-22 DIAGNOSIS — I447 Left bundle-branch block, unspecified: Secondary | ICD-10-CM | POA: Diagnosis not present

## 2020-03-22 DIAGNOSIS — J90 Pleural effusion, not elsewhere classified: Secondary | ICD-10-CM

## 2020-03-22 DIAGNOSIS — Z952 Presence of prosthetic heart valve: Secondary | ICD-10-CM

## 2020-03-22 DIAGNOSIS — I5042 Chronic combined systolic (congestive) and diastolic (congestive) heart failure: Secondary | ICD-10-CM | POA: Diagnosis not present

## 2020-03-22 MED ORDER — AMOXICILLIN 500 MG PO TABS: ORAL_TABLET | ORAL | 11 refills | Status: DC

## 2020-03-22 NOTE — Progress Notes (Signed)
[facility]                                       Cardiology Office Note    Date:  03/22/2020   ID:  LUZ Bullock, DOB 18-Jul-1940, MRN [id number]  PCP:  Michael Boston, MD  Cardiologist: Glenetta Hew, MD / Dr. Angelena Form & Dr. Cyndia Bent (TAVR)  CC: [facility] s/p TAVR  History of Present Illness:  Cesar Bullock is a 79 y.o. male with a history of hearing loss, anxiety, HTN, chronic systolic CHF, non-ischemic cardiomyopathyand severe aortic stenosis (03/14/20) who prsents to clinic for follow up.   He had been seen in 2018 by Dr. Saunders Revel for moderate aortic stenosis but was lost to follow up. Lv function was normal at that time. He had recent worsening lower extremity edema, cough and dyspnea and was evaluated by Dr. Ellyn Hack. Echo 02/08/20 with LVEF=25-30%, mild MR. The aortic valve is tricuspid with thickened, calcifiied leaflets and limited leaflet excursion. Mean gradient 41 mmHg, peak gradient 62. 106mHg, AVA 0.49 cm2. Moderate aortic valve insufficiency.Cardiac cath 02/16/20 with no angiographic evidence of CAD. LE edema improved with diuresis. Of note, pre TAVR CT and CXR showed moderate bilateral pleural effusions.  He was evaluated the multidisciplinary valve team and underwent successful TAVR with a 26 mm Edwards Sapien 3 Ultra THV via the TF approach on 03/14/20. Post operative echo showed EF 40-45%, normally functioning TAVR with a mean gradient of 11 mmHg and no PVL. There large left pleural effusion. He also underwent right thoracentesis yielding 1.1 L of yellow fluid. He was discharged on aspirin and plavix.   Today he presents to clinic for follow up. He is here alone. No CP or SOB. No LE edema, orthopnea or PND. No dizziness or syncope. No blood in stool or urine. No palpitations. [facility] is doing great and [facility] after his valve replacement and thoracentesis.      Past Medical History:    Diagnosis Date  . 2019 novel coronavirus disease (COVID-19) 08/12/2019   -Follow-up test on August 25, 2019 not detected.  Again not detected  both July 13 and 26, 2021  . Chronic combined systolic (congestive) and diastolic (congestive) heart failure (HAgency   . Disequilibrium   . Elevated blood pressure reading without diagnosis of hypertension 05/03/2008   Qualifier: Diagnosis of  By: PLarose KellsMD, JAlvordHLD (hyperlipidemia)   . HOH (hard of hearing)   . Hyperglycemia 11/09/2012  . Hypertension   . NICM (nonischemic cardiomyopathy) ([place]   . Non-ischemic cardiomyopathy (HSylva   . Nuclear cataract 2018   Had surgery  . 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 MAngelena Formand Dr. BCyndia Bent  . Sensorineural hearing loss, bilateral 2016   Strict hearing loss precautions  . Severe aortic stenosis     Past Surgical History:  Procedure Laterality Date  . BUBBLE STUDY  02/16/2020   Procedure: BUBBLE STUDY;  Surgeon: AElouise Munroe MD;  [place]  Service: Cardiology;;  . CARDIAC CATHETERIZATION  02/16/2020  . Cataract surgery  2018   Dr. DCalvert Cantor . EYE SURGERY Bilateral  cataract  . IR THORACENTESIS ASP PLEURAL SPACE W/IMG GUIDE  03/15/2020  . RIGHT/LEFT HEART CATH AND CORONARY ANGIOGRAPHY N/A 02/16/2020   Procedure: RIGHT/LEFT HEART CATH AND CORONARY ANGIOGRAPHY;  Surgeon: Leonie Man, MD;  Location: [place] [facility];  Service: Cardiovascular;  Laterality: N/A;  . TEE WITHOUT CARDIOVERSION N/A 02/16/2020   Procedure: TRANSESOPHAGEAL ECHOCARDIOGRAM (TEE);  Surgeon: Elouise Munroe, MD;  Location: [place];  Service: Cardiology;  Laterality: N/A;  . TEE WITHOUT CARDIOVERSION N/A 03/14/2020   Procedure: TRANSESOPHAGEAL ECHOCARDIOGRAM (TEE);  Surgeon: Burnell Blanks, MD;  Location: [place] [facility];  Service: Open Heart Surgery;   Laterality: N/A;  . TONSILLECTOMY     age 7  . TRANSCATHETER AORTIC VALVE REPLACEMENT, TRANSFEMORAL N/A 03/14/2020   Procedure: TRANSCATHETER AORTIC VALVE REPLACEMENT, TRANSFEMORAL;  Surgeon: Burnell Blanks, MD;  Location: [place] [facility];  Service: Open Heart Surgery;  Laterality: N/A;  . TRANSTHORACIC ECHOCARDIOGRAM  03/2017   [place] [facility]): Mild LVH with normal LV contraction (LVEF 50%). Severely calcified aortic valve with mild regurgitation and moderate-severe stenosis (AoV velocity 4.4 m/s, AVA 0.6 cm^2). Mitral calcification with mild MR. Mild pulmonary hypertension. Normal RV size and function.  . TRANSTHORACIC ECHOCARDIOGRAM  07/19/2015    Normal LV size with mild LVH. LVEF 55-60% with grade 2 diastolic dysfunction. Moderate AS and mild AI. MAC with mild MR. Mild LA enlargement. Normal RV size and function.  . TRANSTHORACIC ECHOCARDIOGRAM  02/11/2020   EF 25-30% with severely reduced function global).  Moderately elevated PA pressures (~53 mmHg).  Severe AS with mild to moderate AI (AVA by VTI ~0.49 cm, mean gradient 41 mmHg. V max 3.48ms; AI py PHT 465 msec)    Current Medications: Outpatient Medications Prior to Visit  Medication Sig Dispense Refill  . aspirin EC 81 MG tablet Take 81 mg by mouth daily. Swallow whole.    . carvedilol (COREG) 3.125 MG tablet Take 1 tablet (3.125 mg total) by mouth 2 (two) times daily. 180 tablet 3  . clopidogrel (PLAVIX) 75 MG tablet Take 1 tablet (75 mg total) by mouth daily with breakfast. 90 tablet 1  . furosemide (LASIX) 20 MG tablet Take 1 tablet (20 mg total) by mouth daily. 90 tablet 3  . losartan (COZAAR) 25 MG tablet Take 1 tablet (25 mg total) by mouth daily. 90 tablet 3   No facility-administered medications prior to visit.     Allergies:   Patient has no known allergies.   Social History   Socioeconomic History  . Marital status: Single    Spouse name: Not on file  . Number of children: 2  . Years of  education: Not on file  . Highest education level: Not on file  Occupational History  . Occupation: mPsychologist, educational works full time    Comment[place] Use  . Smoking status: Never Smoker  . Smokeless tobacco: Never Used  Vaping Use  . Vaping Use: Never used  Substance and Sexual Activity  . Alcohol use: Not Currently    Alcohol/week: 6.0 standard drinks    Types: 6 Shots of liquor per week    Comment: vodka when he stopped drinking roughly 3 months ago _0  none none none  . Drug use: No  . Sexual activity: Not on file  Other Topics Concern  . Not on file  Social History Narrative   Lives by himself   6 g-kids      Former  owner of [employer].      Exercise-minimal   Quit drinking alcohol in March 2021   Social Determinants of Health   Financial Resource Strain:   . Difficulty of Paying Living Expenses: Not on file  Food Insecurity:   . Worried About Charity fundraiser in the Last Year: Not on file  . Ran Out of Food in the Last Year: Not on file  Transportation Needs:   . Lack of Transportation (Medical): Not on file  . Lack of Transportation (Non-Medical): Not on file  Physical Activity:   . Days of Exercise per Week: Not on file  . Minutes of Exercise per Session: Not on file  Stress:   . Feeling of Stress : Not on file  Social Connections:   . Frequency of Communication with Friends and Family: Not on file  . Frequency of Social Gatherings with Friends and Family: Not on file  . Attends Religious Services: Not on file  . Active Member of Clubs or Organizations: Not on file  . Attends Archivist Meetings: Not on file  . Marital Status: Not on file     Family History:  The patient's family history includes CAD (age of onset: 69) in his brother; Cancer (age of onset: 56) in his father; Diabetes in an other family member; Stroke (age of onset: 16) in his mother.     ROS:   Please see the history of present illness.     ROS All other systems reviewed and are negative.   PHYSICAL EXAM:   VS:  BP 132/76   Pulse 65   Ht _0  (1.778 m)   Wt 127 lb 9.6 oz (57.9 kg)   SpO2 99%   BMI 18.31 kg/m    GEN: Well nourished, well developed, in no acute distress HEENT: normal Neck: no JVD or masses Cardiac: RRR; no murmurs, rubs, or gallops,no edema  Respiratory:  clear to auscultation bilaterally, normal work of breathing. Decreased breath sounds laterally GI: soft, nontender, nondistended, + BS MS: no deformity or atrophy Skin: warm and dry, no rash.  Groin sites clear without hematoma or ecchymosis  Neuro:  Alert and Oriented x 3, Strength and sensation are intact Psych: euthymic mood, full affect   Wt Readings from Last 3 Encounters:  03/22/20 127 lb 9.6 oz (57.9 kg)  03/15/20 127 lb 3.3 oz (57.7 kg)  03/10/20 129 lb 1 oz (58.5 kg)      Studies/Labs Reviewed:   EKG:  EKG is ordered today.  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 HR 64 bpm. TWIs in v5 and v6  Recent Labs: 03/10/2020: ALT 15; B Natriuretic Peptide 3,572.8 03/15/2020: BUN 17; Creatinine, Ser 1.04; Hemoglobin 10.4; Magnesium 1.9; Platelets 134; Potassium 3.9; Sodium 140   Lipid Panel    Component Value Date/Time   CHOL 232 (H) 06/12/2015 0839   TRIG 99.0 06/12/2015 0839   HDL 63.70 06/12/2015 0839   CHOLHDL 4 06/12/2015 0839   VLDL 19.8 06/12/2015 0839   LDLCALC 148 (H) 06/12/2015 0839   LDLDIRECT 146.5 11/09/2012 0847    Additional studies/ records that were reviewed today include:  TAVR OPERATIVE NOTE Date of Procedure:03/14/2020  Preoperative Diagnosis:Severe Aortic Stenosis   Postoperative Diagnosis:Same   Procedure:   Transcatheter Aortic Valve Replacement - PercutaneousRightTransfemoral Approach Edwards Sapien 3 Ultra THV (size 66m, model # 9750TFX, serial # [id number]  Co-Surgeons:Bryan KAlveria Apley MD andChristopher MAngelena Form MD   Anesthesiologist:Chris  Ermalene Postin, MD  Dala Dock, MD  Pre-operative Echo Findings: ? Severe aortic stenosis ? Moderateleft ventricular systolic dysfunction  Post-operative Echo Findings: ? Noparavalvular leak ? Improvedleft ventricular systolic function  _____________   Echo 03/15/20:  IMPRESSIONS  1. This was a periprocedular echocardiogram during TAVR procedure. A 26  mm Edwards-SAPIEN 3 valve Ultra was successfully placed in the aortic  position. Transaortic gradients improved from 65/40 mmHg to 9/6 mmHg  postdeployment. No paravalvular leak was  seen. LVEF has improved from 25-30% to 35-40%.  2. Left ventricular ejection fraction, by estimation, is 25 to 30%. The  left ventricle has severely decreased function. There is moderate  concentric left ventricular hypertrophy.  3. Right ventricular systolic function is mildly reduced.  4. Left atrial size was moderately dilated.  5. Right atrial size was mildly dilated.  6. Large pleural effusion in the left lateral region.  7. Mild to moderate mitral valve regurgitation.  8. The aortic valve is tricuspid. Aortic valve regurgitation is mild.  Severe aortic valve stenosis. Aortic valve mean gradient measures 40.0  mmHg.   ASSESSMENT & PLAN:   Severe AS s/p TAVR: doing great with a marked improvement in symptoms. ECG with no HAVB. Groin sites healing well. SBE prophylaxis discussed; I have RX'd amoxicillin. Continue on aspirin and plavix. I will see him back for 1 month follow up and echo.   Bilateral pleural effusions: s/p R thoracentesis yielding 1.1 L of yellow fluid. Post CXR showed decreased right pleural effusion following thoracentesis. No pneumothorax. Improved aeration right lung base. No change left pleural effusion and left lower lobe atelectasis. Continue on lasix. He is breathing much better.   Chronic  combined S/D CHF: appears euvolemic. Continue lasix 20 mg daily.   NICM: continue Losartan, Coreg and lasix.  HTN: BP well controlled today. 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 He was also bradycardic after TAVR. Given risk of HAVB after TAVR, he is wearing a Zio patch. No high risk alerts. No dizziness or syncope.     Medication Adjustments/Labs and Tests Ordered: Current medicines are reviewed at length with the patient today.  Concerns regarding medicines are outlined above.  Medication changes, Labs and Tests ordered today are listed in the Patient Instructions below. Patient Instructions  Medication Instructions:  1) Your provider discussed the importance of taking an antibiotic prior to all dental visits to prevent damage to the heart valves from infection. You were given a prescription for AMOXIL 2,000 mg to take one hour prior to any dental appointment.    *If you need a refill on your cardiac medications before your next appointment, please call your pharmacy*  Follow-Up: Please keep your follow-up appointments as scheduled!    Signed, Angelena Form, PA-C  03/22/2020 4:38 PM    [facility] [address], [place], [place]  [address] Phone: [phone]; Fax: [phone]

## 2020-03-22 NOTE — Patient Instructions (Signed)
Medication Instructions:  1) Your provider discussed the importance of taking an antibiotic prior to all dental visits to prevent damage to the heart valves from infection. You were given a prescription for AMOXIL 2,000 mg to take one hour prior to any dental appointment.    *If you need a refill on your cardiac medications before your next appointment, please call your pharmacy*  Follow-Up: Please keep your follow-up appointments as scheduled!

## 2020-04-08 DIAGNOSIS — Z23 Encounter for immunization: Secondary | ICD-10-CM | POA: Diagnosis not present

## 2020-04-12 ENCOUNTER — Other Ambulatory Visit: Payer: Self-pay

## 2020-04-12 ENCOUNTER — Other Ambulatory Visit: Payer: Self-pay | Admitting: Physician Assistant

## 2020-04-12 ENCOUNTER — Ambulatory Visit (INDEPENDENT_AMBULATORY_CARE_PROVIDER_SITE_OTHER): Payer: Medicare Other | Admitting: Physician Assistant

## 2020-04-12 ENCOUNTER — Encounter: Payer: Self-pay | Admitting: Physician Assistant

## 2020-04-12 ENCOUNTER — Ambulatory Visit (HOSPITAL_COMMUNITY): Payer: Medicare Other | Attending: Cardiovascular Disease

## 2020-04-12 VITALS — BP 168/76 | HR 56 | Ht 70.0 in | Wt 122.7 lb

## 2020-04-12 DIAGNOSIS — J90 Pleural effusion, not elsewhere classified: Secondary | ICD-10-CM | POA: Diagnosis not present

## 2020-04-12 DIAGNOSIS — I428 Other cardiomyopathies: Secondary | ICD-10-CM

## 2020-04-12 DIAGNOSIS — I447 Left bundle-branch block, unspecified: Secondary | ICD-10-CM | POA: Diagnosis not present

## 2020-04-12 DIAGNOSIS — I10 Essential (primary) hypertension: Secondary | ICD-10-CM | POA: Diagnosis not present

## 2020-04-12 DIAGNOSIS — Z952 Presence of prosthetic heart valve: Secondary | ICD-10-CM | POA: Insufficient documentation

## 2020-04-12 DIAGNOSIS — I5042 Chronic combined systolic (congestive) and diastolic (congestive) heart failure: Secondary | ICD-10-CM | POA: Diagnosis not present

## 2020-04-12 LAB — ECHOCARDIOGRAM COMPLETE
AR max vel: 1.1 cm2
AV Area VTI: 1.2 cm2
AV Area mean vel: 1.05 cm2
AV Mean grad: 12 mmHg
AV Peak grad: 19.5 mmHg
Ao pk vel: 2.21 m/s
Area-P 1/2: 2.41 cm2
Height: 70 in
MV M vel: 3.82 m/s
MV Peak grad: 58.4 mmHg
Radius: 0.6 cm
S' Lateral: 3.3 cm
Weight: 1963.2 oz

## 2020-04-12 NOTE — Patient Instructions (Addendum)
Medication Instructions:  When you complete bottle of Plavix in March, you will stop at that time.  [facility] March, 2022)  *If you need a refill on your cardiac medications before your next appointment, please call your pharmacy*   Lab Work: none If you have labs (blood work) drawn today and your tests are completely normal, you will receive your results only by: Marland Kitchen MyChart Message (if you have MyChart) OR . A paper copy in the mail If you have any lab test that is abnormal or we need to change your treatment, we will call you to review the results.   Testing/Procedures: Echo in ONE YEAR   Follow-Up:  Dr. Ellyn Hack in 4 months Nell Range, PA-C in 12 months with ECHO SAME DAY  Other Instructions

## 2020-04-12 NOTE — Progress Notes (Signed)
HEART AND [place]                                       Cardiology Office Note    Date:  04/13/2020   ID:  Cesar Bullock, DOB Mar 15, 1941, MRN [id number]  PCP:  Michael Boston, MD  Cardiologist: Glenetta Hew, MD / Dr. Angelena Form & Dr. Cyndia Bent (TAVR)  CC: 1 month s/p TAVR  History of Present Illness:  Cesar Bullock is a 79 y.o. male with a history of hearing loss, anxiety, HTN, chronic systolic CHF, NICMand severe aortic stenosis (03/14/20) who presents to clinic for follow up.   He had been seen in 2018 by Dr. Saunders Revel for moderate aortic stenosis but was lost to follow up. LV function was normal at that time. He had recent worsening lower extremity edema, cough and dyspnea and was evaluated by Dr. Ellyn Hack. Echo 02/08/20 with LVEF=25-30%, mild MR and severe AS with a mean gradient 41 mmHg, peak gradient 62. 14mHg, AVA 0.49 cm2 and moderate AI.Cardiac cath 02/16/20 showed no angiographic evidence of CAD. LE edema improved with diuresis. Of note, pre TAVR CT and CXR showed moderate bilateral pleural effusions.  He was evaluated the multidisciplinary valve team and underwent successful TAVR with a 26 mm Edwards Sapien 3 Ultra THV via the TF approach on 03/14/20. Post operative echo showed EF 40-45%, normally functioning TAVR with a mean gradient of 11 mmHg and no PVL. There was a large left pleural effusion and he underwent right thoracentesis yielding 1.1 L of yellow fluid. He was discharged on aspirin and plavix. 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  He has done quite well in follow up.   Today he presents to clinic for follow up. Doing great with a marked improvement in symptoms since TAVR. No CP or SOB. No LE edema, orthopnea or PND. No dizziness or syncope. No blood in stool or urine. No palpitations.   Past Medical History:  Diagnosis Date  . 2019 novel coronavirus disease (COVID-19) 08/12/2019    -Follow-up test on August 25, 2019 not detected.  Again not detected  both July 13 and 26, 2021  . Chronic combined systolic (congestive) and diastolic (congestive) heart failure ([place]   . Disequilibrium   . Elevated blood pressure reading without diagnosis of hypertension 05/03/2008   Qualifier: Diagnosis of  By: PLarose KellsMD, JMemphisHLD (hyperlipidemia)   . HOH (hard of hearing)   . Hyperglycemia 11/09/2012  . Hypertension   . NICM (nonischemic cardiomyopathy) ([place]   . Non-ischemic cardiomyopathy ([place]   . Nuclear cataract 2018   Had surgery  . 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 MAngelena Formand Dr. BCyndia Bent  . Sensorineural hearing loss, bilateral 2016   Strict hearing loss precautions  . Severe aortic stenosis     Past Surgical History:  Procedure Laterality Date  . BUBBLE STUDY  02/16/2020   Procedure: BUBBLE STUDY;  Surgeon: AElouise Munroe MD;  [place]  Service: Cardiology;;  . CARDIAC CATHETERIZATION  02/16/2020  . Cataract surgery  2018   Dr. DCalvert Cantor . EYE SURGERY Bilateral    cataract  .  IR THORACENTESIS ASP PLEURAL SPACE W/IMG GUIDE  03/15/2020  . RIGHT/LEFT HEART CATH AND CORONARY ANGIOGRAPHY N/A 02/16/2020   Procedure: RIGHT/LEFT HEART CATH AND CORONARY ANGIOGRAPHY;  Surgeon: Leonie Man, MD;  Location: [place] [facility];  Service: Cardiovascular;  Laterality: N/A;  . TEE WITHOUT CARDIOVERSION N/A 02/16/2020   Procedure: TRANSESOPHAGEAL ECHOCARDIOGRAM (TEE);  Surgeon: Elouise Munroe, MD;  Location: [place];  Service: Cardiology;  Laterality: N/A;  . TEE WITHOUT CARDIOVERSION N/A 03/14/2020   Procedure: TRANSESOPHAGEAL ECHOCARDIOGRAM (TEE);  Surgeon: Burnell Blanks, MD;  Location: [place] [facility];  Service: Open Heart Surgery;  Laterality: N/A;  . TONSILLECTOMY     age 6  . TRANSCATHETER AORTIC VALVE  REPLACEMENT, TRANSFEMORAL N/A 03/14/2020   Procedure: TRANSCATHETER AORTIC VALVE REPLACEMENT, TRANSFEMORAL;  Surgeon: Burnell Blanks, MD;  Location: [place] [facility];  Service: Open Heart Surgery;  Laterality: N/A;  . TRANSTHORACIC ECHOCARDIOGRAM  03/2017   [facility]): Mild LVH with normal LV contraction (LVEF 50%). Severely calcified aortic valve with mild regurgitation and moderate-severe stenosis (AoV velocity 4.4 m/s, AVA 0.6 cm^2). Mitral calcification with mild MR. Mild pulmonary hypertension. Normal RV size and function.  . TRANSTHORACIC ECHOCARDIOGRAM  07/19/2015    Normal LV size with mild LVH. LVEF 55-60% with grade 2 diastolic dysfunction. Moderate AS and mild AI. MAC with mild MR. Mild LA enlargement. Normal RV size and function.  . TRANSTHORACIC ECHOCARDIOGRAM  02/11/2020   EF 25-30% with severely reduced function global).  Moderately elevated PA pressures (~53 mmHg).  Severe AS with mild to moderate AI (AVA by VTI ~0.49 cm, mean gradient 41 mmHg. V max 3.54ms; AI py PHT 465 msec)    Current Medications: Outpatient Medications Prior to Visit  Medication Sig Dispense Refill  . amoxicillin (AMOXIL) 500 MG tablet Take 4 tablets (2,000 mg) one hour prior to all dental visits. 8 tablet 11  . aspirin EC 81 MG tablet Take 81 mg by mouth daily. Swallow whole.    . carvedilol (COREG) 3.125 MG tablet Take 1 tablet (3.125 mg total) by mouth 2 (two) times daily. 180 tablet 3  . clopidogrel (PLAVIX) 75 MG tablet Take 1 tablet (75 mg total) by mouth daily with breakfast. 90 tablet 1  . furosemide (LASIX) 20 MG tablet Take 1 tablet (20 mg total) by mouth daily. 90 tablet 3  . losartan (COZAAR) 25 MG tablet Take 1 tablet (25 mg total) by mouth daily. 90 tablet 3   No facility-administered medications prior to visit.     Allergies:   Patient has no known allergies.   Social History   Socioeconomic History  . Marital status: Single    Spouse name: Not on file  .  Number of children: 2  . Years of education: Not on file  . Highest education level: Not on file  Occupational History  . Occupation: mPsychologist, educational works full time    [place]  . Smoking status: Never Smoker  . Smokeless tobacco: Never Used  Vaping Use  . Vaping Use: Never used  Substance and Sexual Activity  . Alcohol use: Not Currently    Alcohol/week: 6.0 standard drinks    Types: 6 Shots of liquor per week    Comment: vodka when he stopped drinking roughly 3 months ago _0  none none none  . Drug use: No  . Sexual activity: Not on file  Other Topics Concern  . 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      Exercise-minimal   Quit drinking alcohol in March 2021   Social Determinants of Health   Financial Resource Strain:   . Difficulty of Paying Living Expenses: Not on file  Food Insecurity:   . Worried About Charity fundraiser in the Last Year: Not on file  . Ran Out of Food in the Last Year: Not on file  Transportation Needs:   . Lack of Transportation (Medical): Not on file  . Lack of Transportation (Non-Medical): Not on file  Physical Activity:   . Days of Exercise per Week: Not on file  . Minutes of Exercise per Session: Not on file  Stress:   . Feeling of Stress : Not on file  Social Connections:   . Frequency of Communication with Friends and Family: Not on file  . Frequency of Social Gatherings with Friends and Family: Not on file  . Attends Religious Services: Not on file  . Active Member of Clubs or Organizations: Not on file  . Attends Archivist Meetings: Not on file  . Marital Status: Not on file     Family History:  The patient's family history includes CAD (age of onset: 58) in his brother; Cancer (age of onset: 24) in his father; Diabetes in an other family member; Stroke (age of onset: 70) in his mother.     ROS:   Please  see the history of present illness.    ROS All other systems reviewed and are negative.   PHYSICAL EXAM:   VS:  BP (!) 168/76   Pulse (!) 56   Ht _0  (1.778 m)   Wt 122 lb 11.2 oz (55.7 kg)   SpO2 99%   BMI 17.61 kg/m    GEN: Well nourished, well developed, in no acute distress HEENT: normal Neck: no JVD or masses Cardiac: RRR; no murmurs, rubs, or gallops,no edema  Respiratory:  clear to auscultation bilaterally, normal work of breathing. GI: soft, nontender, nondistended, + BS MS: no deformity or atrophy Skin: warm and dry, no rash.   Neuro:  Alert and Oriented x 3, Strength and sensation are intact Psych: euthymic mood, full affect   Wt Readings from Last 3 Encounters:  04/12/20 122 lb 11.2 oz (55.7 kg)  03/22/20 127 lb 9.6 oz (57.9 kg)  03/15/20 127 lb 3.3 oz (57.7 kg)      Studies/Labs Reviewed:   EKG:  EKG is NOT ordered today.   Recent Labs: 03/10/2020: ALT 15; B Natriuretic Peptide 3,572.8 03/15/2020: BUN 17; Creatinine, Ser 1.04; Hemoglobin 10.4; Magnesium 1.9; Platelets 134; Potassium 3.9; Sodium 140   Lipid Panel    Component Value Date/Time   CHOL 232 (H) 06/12/2015 0839   TRIG 99.0 06/12/2015 0839   HDL 63.70 06/12/2015 0839   CHOLHDL 4 06/12/2015 0839   VLDL 19.8 06/12/2015 0839   LDLCALC 148 (H) 06/12/2015 0839   LDLDIRECT 146.5 11/09/2012 0847    Additional studies/ records that were reviewed today include:  TAVR OPERATIVE NOTE Date of Procedure:03/14/2020  Preoperative Diagnosis:Severe Aortic Stenosis   Postoperative Diagnosis:Same   Procedure:   Transcatheter Aortic Valve Replacement - PercutaneousRightTransfemoral Approach Edwards Sapien 3 Ultra THV (size 82m, model # 9750TFX, serial # [id number]  Co-Surgeons:Bryan KAlveria Apley MD andChristopher MAngelena Form MD   Anesthesiologist:Chris MErmalene Postin  MD  EDala Dock MD  Pre-operative Echo Findings: ?  Severe aortic stenosis ? Moderateleft ventricular systolic dysfunction  Post-operative Echo Findings: ? Noparavalvular leak ? Improvedleft ventricular systolic function  _____________   Echo 03/15/20:  IMPRESSIONS  1. This was a periprocedular echocardiogram during TAVR procedure. A 26  mm Edwards-SAPIEN 3 valve Ultra was successfully placed in the aortic  position. Transaortic gradients improved from 65/40 mmHg to 9/6 mmHg  postdeployment. No paravalvular leak was  seen. LVEF has improved from 25-30% to 35-40%.  2. Left ventricular ejection fraction, by estimation, is 25 to 30%. The  left ventricle has severely decreased function. There is moderate  concentric left ventricular hypertrophy.  3. Right ventricular systolic function is mildly reduced.  4. Left atrial size was moderately dilated.  5. Right atrial size was mildly dilated.  6. Large pleural effusion in the left lateral region.  7. Mild to moderate mitral valve regurgitation.  8. The aortic valve is tricuspid. Aortic valve regurgitation is mild.  Severe aortic valve stenosis. Aortic valve mean gradient measures 40.0  mmHg.   _________________  Echo 04/12/20 IMPRESSIONS  1. Left ventricular ejection fraction, by estimation, is 55 to 60%. The  left ventricle has normal function. The left ventricle has no regional  wall motion abnormalities. There is moderate left ventricular hypertrophy.  Left ventricular diastolic  parameters are consistent with Grade II diastolic dysfunction  (pseudonormalization).  2. Right ventricular systolic function is normal. The right ventricular  size is normal. There is normal pulmonary artery systolic pressure.  3. Left atrial size was moderately dilated.  4. The mitral valve is grossly normal. Mild to moderate mitral valve  regurgitation. No evidence of mitral stenosis.  5.  Normally functioning TAVR .The aortic valve has been  repaired/replaced. Aortic valve regurgitation is not visualized. No aortic  stenosis is present. There is a 26 mm Sapien prosthetic (TAVR) valve  present in the aortic position. Procedure Date:  03/14/2020.   ASSESSMENT & PLAN:   Severe AS s/p TAVR: doing excellent with marked symptomatic improvement. He has NYHA class I symptoms. Echo today shows EF normalized to 55%, normally functioning TAVR with a mean gradient of 12 mm hg and no PVL. He has amoxicillin for SBE prophylaxis. Continue on aspirin and plavix. He can discontinue plavix after 6 months of therapy (08/2020). I will see him back in 1 year with echo.   Bilateral pleural effusions: s/p R thoracentesis yielding 1.1 L of yellow fluid. Continue on lasix. He is breathing much better.   Chronic combined S/D CHF: appears euvolemic. EF normalized to 55% today. Continue lasix 20 mg daily.   NICM: resolved. Continue Losartan, Coreg and lasix.  HTN: BP quite elevated today. He will keep a log for me and message me with his readings next week.   Conduction disease: Zio patch still pending formal read but there were no high risk alerts.     Medication Adjustments/Labs and Tests Ordered: Current medicines are reviewed at length with the patient today.  Concerns regarding medicines are outlined above.  Medication changes, Labs and Tests ordered today are listed in the Patient Instructions below. Patient Instructions  Medication Instructions:  When you complete bottle of Plavix in March, you will stop at that time.  [facility] March, 2022)  *If you need a refill on your cardiac medications before your next appointment, please call your pharmacy*   Lab Work: none If you have labs (blood work) drawn today and your tests are completely normal, you will receive your results only by: Marland Kitchen MyChart Message (if  you have MyChart) OR . A paper copy in the mail If you have any lab test that is  abnormal or we need to change your treatment, we will call you to review the results.   Testing/Procedures: Echo in [place]:  Dr. Ellyn Hack in 4 months Nell Range, PA-C in 12 months with ECHO SAME DAY  Other Instructions      Signed, Angelena Form, PA-C  04/13/2020 11:23 AM    [address], [place], [place]  [address] Phone: [phone]; Fax: [phone]

## 2020-04-24 DIAGNOSIS — Z23 Encounter for immunization: Secondary | ICD-10-CM | POA: Diagnosis not present

## 2020-05-12 ENCOUNTER — Telehealth: Payer: Self-pay | Admitting: Physician Assistant

## 2020-05-12 DIAGNOSIS — Z79899 Other long term (current) drug therapy: Secondary | ICD-10-CM

## 2020-05-12 DIAGNOSIS — I10 Essential (primary) hypertension: Secondary | ICD-10-CM

## 2020-05-12 NOTE — Telephone Encounter (Signed)
  [facility]   MULTIDISCIPLINARY HEART VALVE TEAM   Pt sent me the following message through e-mail.  _____________________   Cesar Bullock,  Hoping you and family are all doing well.  I am feeling good.  I am still taking the diuretic and running to the bathroom 2 or 3 times an hour.   I'm wondering if it is time to stop them, OR are they doing what you anticipate them to do ??  I'm not scheduled to see you for another 10+ months, so I'm taking this opportunity to be in touch.  Best regards, [name]. Uriostegui, President [phone] (mobile) ___________________________  Will plan to change lasix to PRN. He will keep a log of his BP and send through mychart as his BP was running high in the office last visit.    Angelena Form PA-C  [facility]

## 2020-05-15 MED ORDER — LOSARTAN POTASSIUM 50 MG PO TABS: 50.0000 mg | ORAL_TABLET | Freq: Every day | ORAL | 3 refills | Status: DC

## 2020-05-15 NOTE — Telephone Encounter (Signed)
-----   Message from Eileen Stanford, [place] sent at 05/14/2020 11:02 PM EST ----- Regarding: med change and follow up labs Can you get a BMET set up for him in a couple weeks and also change losartan from 25mg  to 50mg  daily. I gave him instructions through a mychart message.  Thanks!  KT

## 2020-05-30 ENCOUNTER — Encounter: Payer: Self-pay | Admitting: Gastroenterology

## 2020-05-30 ENCOUNTER — Ambulatory Visit (INDEPENDENT_AMBULATORY_CARE_PROVIDER_SITE_OTHER): Payer: Medicare Other | Admitting: Gastroenterology

## 2020-05-30 VITALS — BP 190/80 | HR 64 | Ht 66.5 in | Wt 138.5 lb

## 2020-05-30 DIAGNOSIS — Z86010 Personal history of colonic polyps: Secondary | ICD-10-CM | POA: Diagnosis not present

## 2020-05-30 NOTE — Progress Notes (Signed)
Review of pertinent gastrointestinal problems: 1.  Elevated risk for colon cancer, colon polyps.  Colonoscopy 05/2015 Dr. Ardis Hughs for routine risk screening found 3 subcentimeter polyps.  These were all removed and they were all proven to be adenomatous.   HPI: This is a very pleasant 79 year old man whom I last saw about 5 years ago at the time of a colonoscopy.  He had severe aortic stenosis and underwent eventual TAVR.  His left ventricular ejection fraction prior to the surgery was 25 to 30%.  Postoperatively it has increased t  55% to 60% based on his most recent cardiology note from last month.  He is on aspirin and Plavix.  He is apparently able to completely stop the Plavix around mid March 2022.  He feels significantly better after his TAVR.  Sounds like his aortic stenosis crept up pretty quickly from moderate to severe.  He has no issues with his GI tract.  Specifically no bleeding, no significant constipation or diarrhea.  Colon cancer does not run in his family   ROS: complete GI ROS as described in HPI, all other review negative.  Constitutional:  No unintentional weight loss   Past Medical History:  Diagnosis Date  . 2019 novel coronavirus disease (COVID-19) 08/12/2019   -Follow-up test on August 25, 2019 not detected.  Again not detected  both July 13 and 26, 2021  . Adenomatous colon polyp 2016   tubular  . Chronic combined systolic (congestive) and diastolic (congestive) heart failure ([place])   . Disequilibrium   . Elevated blood pressure reading without diagnosis of hypertension 05/03/2008   Qualifier: Diagnosis of  By: Larose Kells MD, [place] HLD (hyperlipidemia)   . HOH (hard of hearing)   . Hyperglycemia 11/09/2012  . Hypertension   . NICM (nonischemic cardiomyopathy) ([place])   . Non-ischemic cardiomyopathy ([place])   . Nuclear cataract 2018   Had surgery  . 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 Angelena Form and Dr. Cyndia Bent   . Sensorineural hearing loss, bilateral 2016   Strict hearing loss precautions  . Severe aortic stenosis     Past Surgical History:  Procedure Laterality Date  . BUBBLE STUDY  02/16/2020   Procedure: BUBBLE STUDY;  Surgeon: Elouise Munroe, MD;  Location: [place];  Service: Cardiology;;  . CARDIAC CATHETERIZATION  02/16/2020  . Cataract surgery  2018   Dr. Calvert Cantor  . EYE SURGERY Bilateral    cataract  . IR THORACENTESIS ASP PLEURAL SPACE W/IMG GUIDE  03/15/2020  . RIGHT/LEFT HEART CATH AND CORONARY ANGIOGRAPHY N/A 02/16/2020   Procedure: RIGHT/LEFT HEART CATH AND CORONARY ANGIOGRAPHY;  Surgeon: Leonie Man, MD;  Location: [place] [facility];  Service: Cardiovascular;  Laterality: N/A;  . TEE WITHOUT CARDIOVERSION N/A 02/16/2020   Procedure: TRANSESOPHAGEAL ECHOCARDIOGRAM (TEE);  Surgeon: Elouise Munroe, MD;  Location: [facility] [place];  Service: Cardiology;  Laterality: N/A;  . TEE WITHOUT CARDIOVERSION N/A 03/14/2020   Procedure: TRANSESOPHAGEAL ECHOCARDIOGRAM (TEE);  Surgeon: Burnell Blanks, MD;  Location: [place] [facility];  Service: Open Heart Surgery;  Laterality: N/A;  . TONSILLECTOMY     age 70  . TRANSCATHETER AORTIC VALVE REPLACEMENT, TRANSFEMORAL N/A 03/14/2020   Procedure: TRANSCATHETER AORTIC VALVE REPLACEMENT, TRANSFEMORAL;  Surgeon: Burnell Blanks, MD;  Location: [facility] [facility];  Service: Open Heart Surgery;  Laterality: N/A;  . TRANSTHORACIC ECHOCARDIOGRAM  03/2017   [facility]): Mild LVH with normal LV contraction (LVEF 50%). Severely calcified aortic valve with mild regurgitation and moderate-severe stenosis (AoV velocity 4.4 m/s, AVA 0.6 cm^2). Mitral calcification with mild MR. Mild pulmonary hypertension. Normal RV size and function.  . TRANSTHORACIC ECHOCARDIOGRAM  07/19/2015    Normal LV size with mild LVH. LVEF 55-60% with grade 2  diastolic dysfunction. Moderate AS and mild AI. MAC with mild MR. Mild LA enlargement. Normal RV size and function.  . TRANSTHORACIC ECHOCARDIOGRAM  02/11/2020   EF 25-30% with severely reduced function global).  Moderately elevated PA pressures (~53 mmHg).  Severe AS with mild to moderate AI (AVA by VTI ~0.49 cm, mean gradient 41 mmHg. V max 3.13ms; AI py PHT 465 msec)    Current Outpatient Medications  Medication Sig Dispense Refill  . aspirin EC 81 MG tablet Take 81 mg by mouth daily. Swallow whole.    . carvedilol (COREG) 3.125 MG tablet Take 1 tablet (3.125 mg total) by mouth 2 (two) times daily. 180 tablet 3  . clopidogrel (PLAVIX) 75 MG tablet Take 1 tablet (75 mg total) by mouth daily with breakfast. 90 tablet 1  . losartan (COZAAR) 50 MG tablet Take 1 tablet (50 mg total) by mouth daily. 90 tablet 3  . amoxicillin (AMOXIL) 500 MG tablet Take 4 tablets (2,000 mg) one hour prior to all dental visits. (Patient not taking: Reported on 05/30/2020) 8 tablet 11  . furosemide (LASIX) 20 MG tablet Take 1 tablet (20 mg total) by mouth daily. (Patient not taking: Reported on 05/30/2020) 90 tablet 3   No current facility-administered medications for this visit.    Allergies as of 05/30/2020  . (No Known Allergies)    Family History  Problem Relation Age of Onset  . Diabetes Other        GM  . CAD Brother 524      PCI at age 77   . Stroke Mother 761      Had second stroke at age 23 died age 250 . Cancer Father 385      Reportedly sinus cancer  . Colon cancer Neg Hx   . Prostate cancer Neg Hx     Social History   Socioeconomic History  . Marital status: Single    Spouse name: Not on file  . Number of children: 2  . Years of education: Not on file  . Highest education level: Not on file  Occupational History  . Occupation: mPsychologist, educational works full time    [place]  . Smoking status: Never Smoker  . Smokeless tobacco: Never Used  Vaping Use   . Vaping Use: Never used  Substance and Sexual Activity  . Alcohol use: Not Currently    Alcohol/week: 6.0 standard drinks    Types: 6 Shots of liquor per week    Comment: vodka when he stopped drinking roughly 3 months ago _0  none none none  . Drug use: No  . Sexual activity: Not on file  Other Topics Concern  . 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 Difficulty of Paying Living  Expenses: Not on file  Food Insecurity:   . Worried About Charity fundraiser in the Last Year: Not on file  . Ran Out of Food in the Last Year: Not on file  Transportation Needs:   . Lack of Transportation (Medical): Not on file  . Lack of Transportation (Non-Medical): Not on file  Physical Activity:   . Days of Exercise per Week: Not on file  . Minutes of Exercise per Session: Not on file  Stress:   . Feeling of Stress : Not on file  Social Connections:   . Frequency of Communication with Friends and Family: Not on file  . Frequency of Social Gatherings with Friends and Family: Not on file  . Attends Religious Services: Not on file  . Active Member of Clubs or Organizations: Not on file  . Attends Archivist Meetings: Not on file  . Marital Status: Not on file  Intimate Partner Violence:   . Fear of Current or Ex-Partner: Not on file  . Emotionally Abused: Not on file  . Physically Abused: Not on file  . Sexually Abused: Not on file     Physical Exam: Ht 5' 6.5" (1.689 m) Comment: height measured without shoes  Wt 138 lb 8 oz (62.8 kg)   BMI 22.02 kg/m  Constitutional: generally well-appearing Psychiatric: alert and oriented x3 Abdomen: soft, nontender, nondistended, no obvious ascites, no peritoneal signs, normal bowel sounds No peripheral edema noted in lower  extremities  Assessment and plan: 79 y.o. male with history of colon polyps  We discussed the fact that he is on Plavix status post TAVR 2 months ago.  He will be able to stop the Plavix in March, about 3 months from now.  I would rather he not have to interrupt his Plavix dosing prior to then and so we you are going to arrange for a repeat office visit in early March and as long as his cardiac care is going smoothly still and he is indeed able to stop his Plavix indefinitely then we will arrange for colonoscopy for history of adenomatous colon polyps at that time.  Please see the "Patient Instructions" section for addition details about the plan.  Owens Loffler, MD [facility] 05/30/2020, 2:16 PM   Total time on date of encounter was 25 minutes (this included time spent preparing to see the patient reviewing records; obtaining and/or reviewing separately obtained history; performing a medically appropriate exam and/or evaluation; counseling and educating the patient and family if present; ordering medications, tests or procedures if applicable; and documenting clinical information in the health record).

## 2020-05-30 NOTE — Patient Instructions (Signed)
We will see you back in the office around March 2022 to discuss possible scheduling of colonoscopy at that time due to current therapy of Plavix.   Office will call with appointment for March 2022.   If you are age 77 or older, your body mass index should be between 23-30. Your Body mass index is 22.02 kg/m. If this is out of the aforementioned range listed, please consider follow up with your Primary Care Provider.  If you are age 77 or younger, your body mass index should be between 19-25. Your Body mass index is 22.02 kg/m. If this is out of the aformentioned range listed, please consider follow up with your Primary Care Provider.    Thank you for choosing me and [facility].  Dr. Owens Loffler

## 2020-06-06 ENCOUNTER — Other Ambulatory Visit: Payer: Self-pay

## 2020-06-06 ENCOUNTER — Ambulatory Visit
Admission: RE | Admit: 2020-06-06 | Discharge: 2020-06-06 | Disposition: A | Discharge status: Home / Self Care | Payer: Medicare Other | Source: Ambulatory Visit | Attending: Cardiology | Admitting: Cardiology

## 2020-06-06 ENCOUNTER — Encounter: Payer: Self-pay | Admitting: Cardiology

## 2020-06-06 ENCOUNTER — Ambulatory Visit (INDEPENDENT_AMBULATORY_CARE_PROVIDER_SITE_OTHER): Payer: Medicare Other | Admitting: Cardiology

## 2020-06-06 VITALS — BP 162/84 | HR 49 | Ht 70.0 in | Wt 138.0 lb

## 2020-06-06 DIAGNOSIS — Z952 Presence of prosthetic heart valve: Secondary | ICD-10-CM

## 2020-06-06 DIAGNOSIS — E7849 Other hyperlipidemia: Secondary | ICD-10-CM | POA: Diagnosis not present

## 2020-06-06 DIAGNOSIS — I428 Other cardiomyopathies: Secondary | ICD-10-CM

## 2020-06-06 DIAGNOSIS — I5023 Acute on chronic systolic (congestive) heart failure: Secondary | ICD-10-CM | POA: Diagnosis not present

## 2020-06-06 DIAGNOSIS — I10 Essential (primary) hypertension: Secondary | ICD-10-CM | POA: Diagnosis not present

## 2020-06-06 DIAGNOSIS — I350 Nonrheumatic aortic (valve) stenosis: Secondary | ICD-10-CM

## 2020-06-06 DIAGNOSIS — J90 Pleural effusion, not elsewhere classified: Secondary | ICD-10-CM

## 2020-06-06 DIAGNOSIS — M47814 Spondylosis without myelopathy or radiculopathy, thoracic region: Secondary | ICD-10-CM | POA: Diagnosis not present

## 2020-06-06 MED ORDER — LOSARTAN POTASSIUM 100 MG PO TABS: 100.0000 mg | ORAL_TABLET | Freq: Every day | ORAL | 3 refills | Status: DC

## 2020-06-06 MED ORDER — FUROSEMIDE 40 MG PO TABS: 40.0000 mg | ORAL_TABLET | ORAL | 6 refills | Status: DC | PRN

## 2020-06-06 NOTE — Progress Notes (Signed)
Primary Care Provider: Michael Boston, MD Cardiologist: Glenetta Hew, MD  TAVR Team: Dr. Angelena Form (Cards) & Dr. Cyndia Bent (CVTS) Electrophysiologist: None  Clinic Note: Chief Complaint  Patient presents with  . Follow-up    Doing remarkably better post TAVR  . Cardiac Valve Problem    S/p TAVR   . Hypertension    HPI:    HUEL CENTOLA is a 79 y.o. 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  He  presents today for his first post-TAVR follow-up with me..  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 -> R thoracentesis of 1/1 L.  -1 AVB/LBBB on follow-up EKG - 14 d Zio patch word to exclude CHB/HAVB.   Rc Amison Sokolov was last seen on April 12, 2020 by Miguel Rota, PA C.  Was doing remarkably well.  Now noted NYHA class I symptoms with EF improving up to 50 to 55%.  Discussed importance of SBE prophylaxis.  Plan was to continue Plavix until March 2022.  Plan would be annual follow-up with echo.  Blood pressure is elevated.  He was ultimately started on losartan and titrated up to 100 mg daily.   Reviewed  CV studies:    The following studies were reviewed today: (if available, images/films reviewed: From Epic Chart or Care Everywhere) . R&LHC 02/16/2020: Angiographically NORMAL Coronary Arteries.  Severe Valvular CM (EF ~25%, CO/CI 3.25 /1.82) Mod-Severe Pulm HTN (PAP-mean: 67/25 mmHg - 41 mmHg; PCPWP 27 mHg.  AoP 136/74 mmHg - MAP 99 mmHg. RVP-EDP: 67/4 mmHg - 9 mmHg, RAP 6 mmHg  - mod RA dilation) . TAVR 03/14/2020: 26 mm Edwards SAPIEN 3 ultra THV-TFA approach; AoP 101/47 (MAP 68 mmHg); LVP-EDP 180/10 mmHg-21 mmHg)  o 9/14: POSTOP TEE: (Large left pleural effusion -> thoracentesis of 1.1 L); TransAoV gradients improved from 65/40 mmHg to 9/6 mmHg post TAVR deployment. --> EF improved from 25-30% to 40-45%. Mod LA dilation & mild RA dilation. Mild-mod MR; Large L Lateral pleural effusion.  o 9/15 ECHO: Normal TAVR valve.  Mean gradient 11 mmHg.  No PVL.  EF improved to 40 to 45% with global HK.  Moderate concentric LVH.  GRII DD.  Large left pleural effusion with atelectasis.  Severe LA dilation.  Mild RA dilation.  Mild MR. o Zio patch because of LBBB and 1  AVB - Predom: SR - min HR 49 bpm, Max 113 bpm, Avg 64 bpm. LBBB and 1  AVB.  Rare isolated PACs and PVCs.  PAC couplets and triplets noted.  For short runs of SVT 4-6 beats: Longest was 6 beats at 99 bpm, fastest was 5 beats at 135 bpm).  No evidence of CHB or HAVB.  Echo 04/12/2020: EF 50 to 55%.  GRII DD.  Moderate LA dilation.  Normal PA pressures.  Normal functioning TAVR valve.  No PVL.  No significant stenosis.  Interval History:   Arran Fessel Shaddock returns today for follow-up visit in great spirits.  He has normal skin coloration.  He is happy.  He does  mention that he has had a little bit pressure sensation in his chest and a little bit of fatigue over the last couple days.  He has not yet gotten 200 mg of losartan. He feels remarkably better now 3 months post TAVR.  He is getting back into activity levels walking despite every day and is starting to do some exercise routines.  He has noted a lot of stress with his company.  A lot of it has to do with the Covid issues.  He has been following his blood pressure using an iPhone APP called [employer] which tracks his blood pressure, heart rate and weight.  For the most part his blood pressures have been a little bit up with the averages of into the 140s 150s.  Not quite  were like to be, but better than the recording today.  He now does not have angina sensation is basically just a heavy pressure sensation constantly in his chest.  May be, a little orthopnea but notably improved.  Trivial if any edema.  He is not currently on furosemide.  CV Review of Symptoms (Summary): positive for - chest pain, shortness of breath and Still has low energy, but notably improving. negative for - irregular heartbeat, palpitations, paroxysmal nocturnal dyspnea, rapid heart rate, shortness of breath or Syncope/near syncope or TIA/R severe small claudication.  The patient does not have symptoms concerning for COVID-19 infection (fever, chills, cough, or new shortness of breath).   REVIEWED OF SYSTEMS   Review of Systems  Constitutional: Negative for malaise/fatigue (Energy level is) and weight loss (Is now starting to regain weight).  HENT: Negative for congestion and nosebleeds.   Respiratory: Positive for shortness of breath (A little bit).   Cardiovascular: Positive for chest pain (Little bit of heaviness over the last 2-3 days).  Gastrointestinal: Negative for blood in stool, constipation and melena.  Genitourinary: Negative for frequency and hematuria.  Musculoskeletal: Negative for back pain, falls and joint pain.  Neurological: Positive for weakness (Still generally weak). Negative for dizziness, focal weakness and headaches.  Psychiatric/Behavioral: Negative for depression and memory loss. The patient is not nervous/anxious and does not have insomnia.        He is actually in great spirits.  He feels like he has a new lease on life.     I have reviewed and (if needed) personally updated the patient's problem list, medications, allergies, past medical and surgical history, social and family history.   PAST MEDICAL HISTORY   Past Medical History:  Diagnosis Date  . 2019 novel coronavirus disease (COVID-19) 08/12/2019   -Follow-up test on August 25, 2019 not  detected.  Again not detected  both July 13 and 26, 2021  . Adenomatous colon polyp 2016   tubular  . Chronic combined systolic (congestive) and diastolic (congestive) heart failure ([place]) 01/2020   Diagnosed 01/2020 - significant wgt gain - edema; fatigue & SOB/PND/orthopnea. => Echo EF 20-25%, critical AS. --> initially diuresed, then referred for TAVR -> EF up to 50-55% on 2nd Post-TAVR Echo!!!.  Grr II DD. -- NOW mostly HFpEF  . Disequilibrium   . Elevated blood pressure reading without diagnosis of hypertension 05/03/2008   Qualifier: Diagnosis of  By: Larose Kells MD, [place] HLD (hyperlipidemia)   . HOH (hard of hearing)   . Hyperglycemia 11/09/2012  . Hypertension   . Nuclear cataract 2018   Had surgery  . 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 Angelena Form and Dr. Cyndia Bent   . Sensorineural hearing loss, bilateral 2016   Strict hearing loss precautions  . Severe aortic stenosis   . 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     PAST SURGICAL HISTORY   Past Surgical History:  Procedure Laterality Date  . 14d ZIO PATCH - CARDIAC EVENT MONITOR  03/2020   s/p TAVR: Predom: SR - min HR 49 bpm, Max 113 bpm, Avg 64 bpm. LBBB and 1  AVB.  Rare isolated PACs and PVCs.  PAC couplets and triplets noted.  For short runs of SVT 4-6 beats: Longest was 6 beats at 99 bpm, fastest was 5 beats at 135 bpm).  No evidence of CHB or HAVB.  Marland Kitchen BUBBLE STUDY  02/16/2020   Procedure: BUBBLE STUDY;  Surgeon: Elouise Munroe, MD;  Location: [facility] ENDOSCOPY;  Service: Cardiology;;  . Cataract surgery  2018   Dr. Calvert Cantor  . EYE SURGERY Bilateral    cataract  . IR THORACENTESIS ASP PLEURAL SPACE W/IMG GUIDE  03/15/2020   Right side Pl. Effusion  . RIGHT/LEFT HEART CATH AND CORONARY ANGIOGRAPHY N/A  02/16/2020   Procedure: RIGHT/LEFT HEART CATH AND CORONARY ANGIOGRAPHY;  Surgeon: Leonie Man, MD;  Location: [facility] INVASIVE CV LAB;; PRE-TAVR: Angiograpnhically NORMAL CORONARIES: Severe Valvular CM (EF ~25%, CO/CI 3.25 /1.82) Mod-Severe Pulm HTN (PAP-mean: 67/25 mmHg - 41 mmHg; PCPWP 27 mHg.  AoP 136/74 mmHg - MAP 99 mmHg. RVP-EDP: 67/4 mmHg - 9 mmHg, RAP 6 mmHg - mod RA dilation)  . TEE WITHOUT CARDIOVERSION N/A 02/16/2020   Procedure: TRANSESOPHAGEAL ECHOCARDIOGRAM (TEE);  Surgeon: Elouise Munroe, MD;  Location: [place];  Service: Cardiology;  Laterality: N/A;  . TEE WITHOUT CARDIOVERSION N/A 03/14/2020   Procedure: TRANSESOPHAGEAL ECHOCARDIOGRAM (TEE) - INRA-OP TAVR;  Surgeon: Ena Dawley, MD;  Location: [place] CV LAB;  (Large left pleural effusion -> thoracentesis of 1.1 L); TransAoV gradients improved from 65/40 mmHg to 9/6 mmHg post TAVR deployment. --> EF improved from 25-30% to 40-45%. Mod LA dilation & mild RA dilation. Mild-mod MR; Large L Lateral pleural effusion.   . TONSILLECTOMY     age 68  . TRANSCATHETER AORTIC VALVE REPLACEMENT, TRANSFEMORAL N/A 03/14/2020   Procedure: TRANSCATHETER AORTIC VALVE REPLACEMENT, TRANSFEMORAL;  Surgeon: Burnell Blanks, MD;  Location: [place] INVASIVE CV LAB;  26 mm Edwards Sapien 3U THV - via TFA approach. AoP 101/47 mmHg (MAP 68 mmHg) - LVP-EDP 180/10 mmHg - 21 mmHg.  Marland Kitchen TRANSTHORACIC ECHOCARDIOGRAM  03/2017   [facility]): Mild LVH with normal LV contraction (LVEF 50%). Severely calcified aortic valve with mild regurgitation and moderate-severe stenosis (AoV velocity 4.4 m/s, AVA 0.6 cm^2). Mitral calcification with mild MR. Mild pulmonary hypertension. Normal RV size and function.  . TRANSTHORACIC ECHOCARDIOGRAM  07/19/2015    Normal LV size with mild LVH. LVEF 55-60% with grade 2 diastolic dysfunction. Moderate AS and mild AI. MAC with mild MR. Mild LA enlargement. Normal RV size and function.  . TRANSTHORACIC  ECHOCARDIOGRAM  02/11/2020   EF 25-30% with severely reduced function global).  Moderately elevated PA pressures (~53 mmHg).  Severe AS with mild to moderate AI (AVA by VTI ~0.49 cm, mean gradient 41 mmHg. V max 3.46ms; AI py PHT 465 msec)  . TRANSTHORACIC ECHOCARDIOGRAM  03/15/2020  POD #1 - s/p TAVR: Normal TAVR valve.  Mean gradient 11 mmHg.  No PVL.  EF improved to 40 to 45% with global HK.  Moderate concentric LVH.  GRII DD.  Large left pleural effusion with atelectasis.  Severe LA dilation.  Mild RA dilation.  Mild MR.  . TRANSTHORACIC ECHOCARDIOGRAM  04/12/2020   1 month s/p TAVR: EF 50 to 55%.  GRII DD.  Moderate LA dilation.  Normal PA pressures.  Normal functioning TAVR valve.  No PVL.  No significant stenosis.   R&LHC 02/16/2020  Severe Aortic Stenosis  Severe Valvular Cardiomyopathy - EF ~25% & CO/CI: 3.25/1.82  Moderate to Severe Pulmonary Hypertension  Angiographically Normal Coronary Arteries - Right Dominant   Immunization History  Administered Date(s) Administered  . PFIZER SARS-COV-2 Vaccination 08/27/2019, 09/21/2019    MEDICATIONS/ALLERGIES   Current Meds  Medication Sig  . aspirin EC 81 MG tablet Take 81 mg by mouth daily. Swallow whole.  . carvedilol (COREG) 3.125 MG tablet Take 1 tablet (3.125 mg total) by mouth 2 (two) times daily.  . clopidogrel (PLAVIX) 75 MG tablet Take 1 tablet (75 mg total) by mouth daily with breakfast.  . [DISCONTINUED] losartan (COZAAR) 50 MG tablet Take 1 tablet (50 mg total) by mouth daily.     No Known Allergies   SOCIAL HISTORY/FAMILY HISTORY   Reviewed in Epic:  Pertinent findings: LOTS of STRESS with work -- [employer]Alycia Patten -PE, EKG, labs   Wt Readings from Last 3 Encounters:  06/06/20 138 lb (62.6 kg)  05/30/20 138 lb 8 oz (62.8 kg)  04/12/20 122 lb 11.2 oz (55.7 kg)    Physical Exam: BP (!) 162/84   Pulse (!) 49   Ht _0  (1.778 m)   Wt 138 lb (62.6 kg)   SpO2 97%   BMI 19.80 kg/m   Physical Exam Vitals reviewed.  Constitutional:      General: He is not in acute distress.    Appearance: Normal appearance. He is normal weight. He is not ill-appearing or toxic-appearing.     Comments: Remarkably healthy appearing compared to last visit.  Skin tone and color is notably improved.  His demeanor and life outlook has also dramatically improved.  He is smiling.  Neck:     Vascular: No carotid bruit, hepatojugular reflux or JVD.  Cardiovascular:     Rate and Rhythm: Regular rhythm. Bradycardia present.     Pulses: Normal pulses and intact distal pulses.     Heart sounds: Normal heart sounds. No murmur (Remarkably, only very minimal/soft 1/6 SEM at RUSB.) heard. No friction rub. No gallop.   Pulmonary:     Effort: Pulmonary effort is normal. No respiratory distress.     Comments: Still has mildly diminished basal breath sounds, but nonlabored. Chest:     Chest wall: No tenderness.  Abdominal:     Palpations: There is no mass.  Musculoskeletal:        General: No swelling, deformity or signs of injury. Normal range of motion.     Cervical back: Normal range of motion and neck supple.  Skin:    General: Skin is warm and dry.     Comments: Nice natural skin times  Neurological:     General: No focal deficit present.     Mental Status: He is alert and oriented to person, place, and time.     Cranial Nerves: No cranial nerve deficit.  Psychiatric:        Mood  and Affect: Mood normal.        Behavior: Behavior normal.        Thought Content: Thought content normal.        Judgment: Judgment normal.      Adult ECG Report n/a  Recent Labs: Last recorded lipids from June 2018: TC 1-2 40, TC 117, HDL 59, LDL 158.    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  Now having minimal HFpEF symptoms.  I do think we need to prophylactically treat as though his EF is still down since he has at least class II-III diastolic function.  Plan:  Continue current low-dose carvedilol (difficult with bradycardia).   May need to consider either dihydropyridine channel blocker or calcium blocker.      Relevant Medications   losartan (COZAAR) 100 MG tablet   furosemide (LASIX) 40 MG tablet   Other Relevant Orders   EKG 12-Lead (Completed)   DG Chest 2 View (Completed)   Brain natriuretic peptide (Completed)   Basic metabolic panel (Completed)   Essential hypertension (Chronic)    Blood pressure is very high today.  Plan: Increase losartan to 100 mg daily and start Lasix 40 mg as needed, but at least daily for the next couple days.   with bradycardia at 49, cannot push carvedilol Promus further.   Neck step for additional blood pressure control would be probably converting to a more potent ARB plus/minus thiazide diuretic, or adding calcium channel blocker      Relevant Medications   losartan (COZAAR) 100 MG tablet   furosemide (LASIX) 40 MG tablet   Acute on chronic systolic heart failure (HCC) (Chronic)    NormalNow resolved acute CHF.  Edema, PND orthopnea.  Just a little bit of chest tightness.  Now with EF just about back to baseline normal, this would be more consistent with chronic HFpEF with grade 2 diastolic function noted on echo.  Plan:   We will check chest x-ray just to confirm no further  pulmonary edema or effusions.  He is on low-dose carvedilol and now on losartan which have been increased to 1 mg daily.  Because of mild persistent swelling, will add back in PRN 40 mg Lasix which she will take a couple days a week and then otherwise as needed.       Relevant Medications   losartan (COZAAR) 100 MG tablet   furosemide (LASIX) 40 MG tablet   Severe aortic stenosis (Chronic)    Significantly reduced gradients now on postop echo.  Minimal murmur heard on exam.      Relevant Medications   losartan (COZAAR) 100 MG tablet   furosemide (LASIX) 40 MG tablet   Other Relevant Orders  EKG 12-Lead (Completed)   HLD (hyperlipidemia)    Labs are well out-of-control.  I am not yet ready to attack this with him at this point.  We can readdress in follow-up.      Relevant Medications   losartan (COZAAR) 100 MG tablet   furosemide (LASIX) 40 MG tablet   Pleural effusion, bilateral    He did have right pleural effusion was drained with thoracentesis  Prior to discharge.  With him having a little bit of dyspnea and limited chest heaviness, would like to exclude any reaccumulation but also to confirm lack of effusion/pulmonary edema.  Plan: Chest x-ray      Relevant Orders   DG Chest 2 View (Completed)   S/P TAVR (transcatheter aortic valve replacement) - Primary    Very impressive response to TAVR placement with improved EF and dramatically improved symptoms.  No paravalvular leak on follow-up echo.  He remains on aspirin and Plavix per TAVR team.      Relevant Orders   EKG 12-Lead (Completed)   DG Chest 2 View (Completed)       COVID-19 Education: The signs and symptoms of COVID-19 were discussed with the patient and how to seek care for testing (follow up with PCP or arrange E-visit).   The importance of social distancing and COVID-19 vaccination was discussed today. 1 min The patient is practicing social distancing & Masking.   I spent a total of 21mnutes with  the patient spent in direct patient consultation.  Additional time spent with chart review  / charting (studies, outside notes, etc): 25  -> Multiple procedures and clinic visits reviewed.  History updated. Total Time: 55 min  Current medicines are reviewed at length with the patient today.  (+/- concerns) n/a  This visit occurred during the SARS-CoV-2 public health emergency.  Safety protocols were in place, including screening questions prior to the visit, additional usage of staff PPE, and extensive cleaning of exam room while observing appropriate contact time as indicated for disinfecting solutions.  Notice: This dictation was prepared with Dragon dictation along with smaller phrase technology. Any transcriptional errors that result from this process are unintentional and may not be corrected upon review.  Patient Instructions / Medication Changes & Studies & Tests Ordered   Patient Instructions  Medication Instructions:   Increase Losartan 100 mg  One tablet daily  -- new prescription sent to pharmacy  ( until you start new bottle -you can take 2 tablets of 50 mg dose to equal 100 mg )   May take Lasix ( furosemide)  40 mg  As needed (daily)   For chest tightness or worsening shortness of breath. *If you need a refill on your cardiac medications before your next appointment, please call your pharmacy*   Lab Work:  today -- BNP, BMP   If you have labs (blood work) drawn today and your tests are completely normal, you will receive your results only by: .Marland KitchenMyChart Message (if you have MyChart) OR . A paper copy in the mail If you have any lab test that is abnormal or we need to change your treatment, we will call you to review the results.   Testing/Procedures:  [facility]  A chest x-ray takes a picture of the organs and structures inside the chest, including the heart, lungs, and blood vessels. This test can show several things,  including, whether the heart is enlarges; whether fluid is building up in the  lungs; and whether pacemaker / defibrillator leads are still in place.   Follow-Up: At [facility], you and your health needs are our priority.  As part of our continuing mission to provide you with exceptional heart care, we have created designated Provider Care Teams.  These Care Teams include your primary Cardiologist (physician) and Advanced Practice Providers (APPs -  Physician Assistants and Nurse Practitioners) who all work together to provide you with the care you need, when you need it.    Your next appointment:   2 month(s) FEB 2022  The format for your next appointment:   In Person  Provider:    Glenetta Hew, MD    Other Instructions     Studies Ordered:   Orders Placed This Encounter  Procedures  . DG Chest 2 View  . Brain natriuretic peptide  . Basic metabolic panel  . EKG 12-Lead     Glenetta Hew, M.D., M.S. Interventional Cardiologist   Pager # [phone] Phone # [phone] [address] [place], [place] [address]   Thank you for choosing Heartcare at [facility]!!

## 2020-06-06 NOTE — Patient Instructions (Addendum)
Medication Instructions:   Increase Losartan 100 mg  One tablet daily  -- new prescription sent to pharmacy  ( until you start new bottle -you can take 2 tablets of 50 mg dose to equal 100 mg )   May take Lasix ( furosemide)  40 mg  As needed (daily)   For chest tightness or worsening shortness of breath. *If you need a refill on your cardiac medications before your next appointment, please call your pharmacy*   Lab Work:  today -- BNP, BMP   If you have labs (blood work) drawn today and your tests are completely normal, you will receive your results only by: Marland Kitchen MyChart Message (if you have MyChart) OR . A paper copy in the mail If you have any lab test that is abnormal or we need to change your treatment, we will call you to review the results.   Testing/Procedures:  schedule at [facility])  A chest x-ray takes a picture of the organs and structures inside the chest, including the heart, lungs, and blood vessels. This test can show several things, including, whether the heart is enlarges; whether fluid is building up in the lungs; and whether pacemaker / defibrillator leads are still in place.   Follow-Up: At [facility], you and your health needs are our priority.  As part of our continuing mission to provide you with exceptional heart care, we have created designated Provider Care Teams.  These Care Teams include your primary Cardiologist (physician) and Advanced Practice Providers (APPs -  Physician Assistants and Nurse Practitioners) who all work together to provide you with the care you need, when you need it.    Your next appointment:   2 month(s) FEB 2022  The format for your next appointment:   In Person  Provider:    Glenetta Hew, MD    Other Instructions

## 2020-06-07 LAB — BASIC METABOLIC PANEL
BUN/Creatinine Ratio: 18 (ref 10–24)
BUN: 16 mg/dL (ref 8–27)
CO2: 25 mmol/L (ref 20–29)
Calcium: 9.8 mg/dL (ref 8.6–10.2)
Chloride: 105 mmol/L (ref 96–106)
Creatinine, Ser: 0.89 mg/dL (ref 0.76–1.27)
GFR calc Af Amer: 95 mL/min/{1.73_m2} (ref >59)
GFR calc non Af Amer: 82 mL/min/{1.73_m2} (ref >59)
Glucose: 78 mg/dL (ref 65–99)
Potassium: 4.7 mmol/L (ref 3.5–5.2)
Sodium: 141 mmol/L (ref 134–144)

## 2020-06-07 LAB — BRAIN NATRIURETIC PEPTIDE: BNP: 152.2 pg/mL — ABNORMAL HIGH (ref 0.0–100.0)

## 2020-06-10 ENCOUNTER — Encounter: Payer: Self-pay | Admitting: Cardiology

## 2020-06-10 NOTE — Assessment & Plan Note (Signed)
Labs are well out-of-control.  I am not yet ready to attack this with him at this point.  We can readdress in follow-up.

## 2020-06-10 NOTE — Assessment & Plan Note (Signed)
Significantly reduced gradients now on postop echo.  Minimal murmur heard on exam.

## 2020-06-10 NOTE — Assessment & Plan Note (Signed)
He did have right pleural effusion was drained with thoracentesis  Prior to discharge.  With him having a little bit of dyspnea and limited chest heaviness, would like to exclude any reaccumulation but also to confirm lack of effusion/pulmonary edema.  Plan: Chest x-ray

## 2020-06-10 NOTE — Assessment & Plan Note (Addendum)
NormalNow resolved acute CHF.  Edema, PND orthopnea.  Just a little bit of chest tightness.  Now with EF just about back to baseline normal, this would be more consistent with chronic HFpEF with grade 2 diastolic function noted on echo.  Plan:   We will check chest x-ray just to confirm no further pulmonary edema or effusions.  He is on low-dose carvedilol and now on losartan which have been increased to 1 mg daily.  Because of mild persistent swelling, will add back in PRN 40 mg Lasix which she will take a couple days a week and then otherwise as needed.

## 2020-06-10 NOTE — Assessment & Plan Note (Signed)
Blood pressure is very high today.  Plan: Increase losartan to 100 mg daily and start Lasix 40 mg as needed, but at least daily for the next couple days.   with bradycardia at 51, cannot push carvedilol Promus further.   Neck step for additional blood pressure control would be probably converting to a more potent ARB plus/minus thiazide diuretic, or adding calcium channel blocker

## 2020-06-10 NOTE — Assessment & Plan Note (Signed)
Very impressive response to TAVR placement with improved EF and dramatically improved symptoms.  No paravalvular leak on follow-up echo.  He remains on aspirin and Plavix per TAVR team.

## 2020-06-10 NOTE — Assessment & Plan Note (Signed)
Amazing recovery in EF from 20-25% EF up to 50 and 55% following TAVR in medication adjustment.  Now having minimal HFpEF symptoms.  I do think we need to prophylactically treat as though his EF is still down since he has at least class II-III diastolic function.  Plan:  Continue current low-dose carvedilol (difficult with bradycardia).   May need to consider either dihydropyridine channel blocker or calcium blocker.

## 2020-06-21 ENCOUNTER — Other Ambulatory Visit: Payer: Self-pay

## 2020-06-21 ENCOUNTER — Ambulatory Visit (INDEPENDENT_AMBULATORY_CARE_PROVIDER_SITE_OTHER): Payer: Medicare Other | Admitting: Pharmacist

## 2020-06-21 VITALS — BP 184/78 | HR 62

## 2020-06-21 DIAGNOSIS — I10 Essential (primary) hypertension: Secondary | ICD-10-CM

## 2020-06-21 MED ORDER — AMLODIPINE BESYLATE 5 MG PO TABS: 5.0000 mg | ORAL_TABLET | Freq: Every day | ORAL | 3 refills | Status: DC

## 2020-06-21 MED ORDER — IRBESARTAN 300 MG PO TABS: 300.0000 mg | ORAL_TABLET | Freq: Every day | ORAL | 3 refills | Status: DC

## 2020-06-21 NOTE — Patient Instructions (Addendum)
Please START taking amlodipine 2.5 mg (1/2 tablet) daily.  STOP taking losartan. START taking irbesartan 300mg  daily.  Continue carvedilol 3.125mg  twice a day and furosemide 40mg  as needed.  Start exercising. Start with 50min at a time and work your way up.  Call me at [phone] with any questions.  Hypertension "High blood pressure"  Hypertension is often called "The Silent Killer." It rarely causes symptoms until it is extremely  high or has done damage to other organs in the body. For this reason, you should have your  blood pressure checked regularly by your physician. We will check your blood pressure  every time you see a provider at one of our offices.   Your blood pressure reading consists of two numbers. Ideally, blood pressure should be  below 120/80. The first ("top") number is called the systolic pressure. It measures the  pressure in your arteries as your heart beats. The second ("bottom") number is called the diastolic pressure. It measures the pressure in your arteries as the heart relaxes between beats.  The benefits of getting your blood pressure under control are enormous. A 10-point  reduction in systolic blood pressure can reduce your risk of stroke by 27% and heart failure by 28%  Your blood pressure goal is <130/8/0  To check your pressure at home you will need to:  1. Sit up in a chair, with feet flat on the floor and back supported. Do not cross your ankles or legs. 2. Rest your left arm so that the cuff is about heart level. If the cuff goes on your upper arm,  then just relax the arm on the table, arm of the chair or your lap. If you have a wrist cuff, we  suggest relaxing your wrist against your chest (think of it as Pledging the Flag with the  wrong arm).  3. Place the cuff snugly around your arm, about 1 inch above the crook of your elbow. The  cords should be inside the groove of your elbow.  4. Sit quietly, with the cuff in place, for about 5  minutes. After that 5 minutes press the power  button to start a reading. 5. Do not talk or move while the reading is taking place.  6. Record your readings on a sheet of paper. Although most cuffs have a memory, it is often  easier to see a pattern developing when the numbers are all in front of you.  7. You can repeat the reading after 1-3 minutes if it is recommended  Make sure your bladder is empty and you have not had caffeine or tobacco within the last 30 min  Always bring your blood pressure log with you to your appointments. If you have not brought your monitor in to be double checked for accuracy, please bring it to your next appointment.  You can find a list of validated (accurate) blood pressure cuffs at PopPath.it  Healthy Diet  SALT  What is the big deal with sodium? Why the need to limit our intake? What is the connection to  blood pressure? And what is the difference between salt and sodium? Sodium attracts water. Think about it. When you eat an overly salty snack, you tend to become  thirsty and need more water. If you have too much sodium in your bloodstream, your body will  then pull water into the bloodstream as well, trying to correct the imbalance. When you have  more volume in the bloodstream, your blood pressure goes  up. Your heart must work harder to  pump the extra volume, and the increase in pressure can wear out blood vessels faster. You may  also notice bloating and weight gain. Hypertension is one of the leading risk factors for heart  disease. By limiting sodium intake throughout life you are helping decrease your risk of heart  disease later on.   Salt is made up of two minerals. Sodium and chloride. A teaspoon of salt contains about 40%  sodium and 60% chloride. One teaspoon of salt has 2,300 mg of sodium. While the current  USDA guideline states you should consume no more than 2,300 mg per day, both they and the  American Heart Association recommend  that you limit this to 1,500 mg to stay healthy. Sea salt  or [place] pink salt may have a slightly different taste, but they still have almost the same  percent of sodium per teaspoon. So feel free to use them instead of table salt, but don't use  more.  A common myth is that if you don't add salt to your food, you are following a low sodium diet.  However, 75% of the sodium consumed in the American diet is from processed foods, NOT the  salt shaker. We all know that chips and crackers are high in sodium, but there are many other  foods that we may not think of when limiting our sodium. Below are the "salty six" foods that  the American Heart Association wants you to be aware of. 1. Cold cuts - even the healthy sliced [place] can have over 1,000 mg of sodium per slice.   Compare different brands to see which has less sodium if you eat these regularly 2. Pizza - depending on your toppings, a slice of pizza can have up to 760 mg of   sodium. Put more veggies on it or just have a slice with a side salad and still enjoy. 3. Soup - yes even that old home remedy of chicken soup is loaded with sodium. Look   for low sodium versions. Or add a bunch of frozen veggies when heating it, this will   give you less sodium per serving 4. Breads - they may not taste salty, but a single slice of bread can have up to 230 mg of   sodium. Toast for breakfast, a sandwich at lunch and a dinner roll can quickly add up   to over 900 mg in just one day.  5. Chicken - some fresh or frozen chicken is injected with a sodium solution before it   reaches the store. A 4 oz serving should have no more than 100 mg sodium. And   watch for breaded frozen chicken nuggets, strips and tenders. They may seem like a   quick and easy "healthy" meal, but they have high amounts of sodium as well. 6. Burritos/tacos - just 2 teaspoons of taco seasoning can have over 400 mg sodium.   Try making your own with equal parts of cumin,  oregano, chili powder and garlic powder.   SUGAR  Sugar is a huge problem in the modern day diet. Sugar is a HUGE contributor to heart disease, diabetes, high triglyceride levels, fatty liver diease and obesity. Sugar is hidden in almost all packaged foods/beverages. It adds no nutritional benefit to your body and can cause major harm. Added sugar is extra sugar that is added beyond what is naturally found. The American Heart Association recommends limiting added sugars to no more  than 25g for women and 36 grams for men per day.  There are many names for sugar maltose, sucrose (names ending in "ose"), high fructose corn syrup, molasses, cane sugar, corn sweetener, raw sugar, syrup, honey or fruit juice concentrate.   One of the best ways to limit your added sugars is to stop drinking sweetened beverages such as soda, sweet tea, fruit juice or fancy coffee's. There is 65g of added sugars in one 20oz bottle of Coke!! That is equal to 6 donuts.   Pay attention and read all nutrition facts labels. Below is an examples of a nutrition facts label. The #1 is showing you the total sugars where the # 2 is showing you the added sugars. This one severing has almost the max amount of added sugars per day!  Watch out for items that say "low fat" or "no added sugar" as these products are typically very high in sugar. The food industry uses these terms to fool you into thinking they are healthy.  For more information on the dangers of sugar watch WHY Sugar is as Bad as Alcohol (Fructose, The Liver Toxin) on YouTube.    EXERCISE  Exercise can help lower your blood pressure ~5 points systolic (top #) and 8 points diastolic (bottom #)  Exercise is good. We've all heard that. In an ideal world, we would all have time and resources to  get plenty of it. When you are active your heart pumps more efficiently and you will feel better.  Multiple studies show that even walking regularly has benefits that include  living a longer life.  The American Heart Association recommends 90-150 minutes per week of exercise (30 minutes  per day most days of the week). You can do this in any increment you wish. Nine or more  10-minute walks count. So does an hour-long exercise class. Break the time apart into what will  work in your life. Some of the best things you can do include walking briskly, jogging, cycling or  swimming laps. Not everyone is ready to "exercise." Sometimes we need to start with just getting active. Here  are some easy ways to be more active throughout the day: Marland Kitchen Take the stairs instead of the elevator . Go for a 10-15 minute walk during your lunch break (find a friend to make it more enjoyable) . When shopping, park at the back of the parking lot . If you take public transportation, get off one stop early and walk the extra distance . Pace around while making phone calls (most of [place] are not attached to phone cords any longer!) Check with your doctor if you aren't sure what your limitations may be. Always remember to drink plenty of water when doing any type of exercise. Don't feel like a failure if you're not getting the 90-150 minutes per week. If you started by being  a couch potato, then just a 10-minute walk each day is a huge improvement. Start with little  victories and work your way up.   Healthy Eating Tips  When looking to improve your eating habits, whether to lose weight, lower blood pressure or just be healthier, it helps to know what a serving size is.   Grains 1 slice of bread,  bagel,  cup pasta or rice  Vegetables 1 cup fresh or raw vegetables,  cup cooked or canned Fruits 1 piece of medium sized fruit,  cup canned,   Meats/Proteins  cup dried  1 oz meat, 1 egg,  cup cooked beans, nuts or seeds  Dairy        Fats Individual yogurt container, 1 cup (8oz)    1 teaspoon margarine/butter or vegetable  milk or milk alternative, 1 slice of cheese          oil;  1 tablespoon mayonnaise or salad dressing                  Plan ahead: make a menu of the meals for a week then create a grocery list to go with  that menu. Consider meals that easily stretch into a night of leftovers, such as stews or  casseroles. Or consider making two of your favorite meal and put one in the freezer for  another night. Try a night or two each week that is "meatless" or "no cook" such as salads.  When you get home from the grocery store wash and prepare your vegetables and fruits.  Then when you need them they are ready to go.  Tips for going to the grocery store: . Buy store or generic brands . Check the weekly ad from your store on-line or in their in-store flyer . Look at the unit price on the shelf tag to compare/contrast the costs of different items . Buy fruits/vegetables in season . Carrots, bananas and apples are low-cost, naturally healthy items . If meats or frozen vegetables are on sale, buy some extras and put in your freezer . Limit buying prepared or "ready to eat" items, even if they are pre-made salads or fruit snacks . Do not shop when you're hungry . Foods at eye level tend to be more expensive. Look on the high and low shelves for deals. . Consider shopping at the farmer's market for fresh foods in season. . Choose canned tuna or salmon instead of fresh . Avoid the cookie and chip aisles (these are expensive, high in calories and low in  nutritional value) Healthy food preparations: . If you can't get lean hamburger, be sure to drain the fat when cooking . Steam, saut (in olive oil), grill or bake foods . Experiment with different seasonings to avoid adding salt to your foods. Kosher salt, sea salt and Fisher salt are all still salt and should be avoided         Resources: American Heart Association - InstantFinish.fi Go to the Healthy Living tab to get more information American Diabetes Association - www.diabetes.org You don't have  to be diabetic - check out the Food and Fitness tab  DASH diet - https://wilson-eaton.com/ Health topics - or just search on their home page for DASH Quit for Life - www.cancer.org Follow the Stay Healthy tab to learn more about smoking cessatio

## 2020-06-21 NOTE — Progress Notes (Signed)
Patient ID: Cesar Bullock                 DOB: 04/10/1941                      MRN: [id number]     HPI: Cesar Bullock is a 79 y.o. male referred by Dr. Ellyn Hack to HTN clinic. 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  At visit with Dr. Ellyn Hack on 06/06/20 patient reported blood pressure running in the 140's-150's. Losartan was increased to 100mg  daily. Blood pressures sent via mychart ranged from 143/73 to 180/89. HR precludes increasing carvedilol.  Patient presents today to HTN clinic for initial appointment. He denies dizziness, lightheadedness, headache, blurred vision, SOB or swelling. He does not exercise, but does have a treadmill and bike at home. 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 Tries to watch his sodium, but does eat deli meat for lunch. Has an OMRON upper arm BP machine that links to his phone. Has not had alcohol since May. Use to only drink 1 drink per night. Is considering starting to drink some alcohol again. Has not needed any furosemide in awhile. Discussed times he may need to use it.  Current HTN meds: carvedilol 3.125mg  twice a day, losartan 100mg  daily, furosemide 40mg  as needed Previously tried: none BP goal: <130/80  Family History:  Family History  Problem Relation Age of Onset  . Diabetes Other        GM  . CAD Brother 73       PCI at age 71;   . Stroke Mother 67       Had second stroke at age 70, died age 58  . Cancer Father 55       Reportedly sinus cancer  . Colon cancer Neg Hx   . Prostate cancer Neg Hx      Social History: use to have a drink a day, stopped May 2  Diet: small cup of "expresso" in the AM, green tea 8oz  Salmon, fish [place] sandwich  Exercise: does not exercise  Home BP readings: 143/74, 170/86, 151/82, 161/86, 139/74, 137/76, 150/83,  168/85, 156/86, 149/85, 163/76, 159/88, 180/89, 175/91 HR 50's-60's  Wt Readings from Last 3 Encounters:  06/06/20 138 lb (62.6 kg)  05/30/20 138 lb 8 oz (62.8 kg)  04/12/20 122 lb 11.2 oz (55.7 kg)   BP Readings from Last 3 Encounters:  06/06/20 (!) 162/84  05/30/20 (!) 190/80  04/12/20 (!) 168/76   Pulse Readings from Last 3 Encounters:  06/06/20 (!) 49  05/30/20 64  04/12/20 (!) 56    Renal function: CrCl cannot be calculated (Unknown ideal weight.).  Past Medical History:  Diagnosis Date  . 2019 novel coronavirus disease (COVID-19) 08/12/2019   -Follow-up test on August 25, 2019 not detected.  Again not detected  both July 13 and 26, 2021  . Adenomatous colon polyp 2016   tubular  . Chronic combined systolic (congestive) and diastolic (congestive) heart failure ([place]) 01/2020   Diagnosed 01/2020 - significant wgt gain - edema; fatigue & SOB/PND/orthopnea. => Echo EF 20-25%, critical AS. --> initially diuresed, then referred for TAVR -> EF up to 50-55% on 2nd Post-TAVR Echo!!!.  Grr II DD. -- NOW mostly HFpEF  . Disequilibrium   . Elevated blood pressure reading without diagnosis of hypertension 05/03/2008  Qualifier: Diagnosis of  By: Larose Kells MD, [facility] (hyperlipidemia)   . HOH (hard of hearing)   . Hyperglycemia 11/09/2012  . Hypertension   . Nuclear cataract 2018   Had surgery  . 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 Angelena Form and Dr. Cyndia Bent   . Sensorineural hearing loss, bilateral 2016   Strict hearing loss precautions  . Severe aortic stenosis   . 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     Current Outpatient Medications on File Prior to Visit  Medication Sig Dispense Refill  . aspirin EC 81 MG tablet Take 81 mg by mouth daily. Swallow whole.     . carvedilol (COREG) 3.125 MG tablet Take 1 tablet (3.125 mg total) by mouth 2 (two) times daily. 180 tablet 3  . clopidogrel (PLAVIX) 75 MG tablet Take 1 tablet (75 mg total) by mouth daily with breakfast. 90 tablet 1  . furosemide (LASIX) 40 MG tablet Take 1 tablet (40 mg total) by mouth as needed. For tightness in your chest or worsening or shortness of breath 30 tablet 6  . losartan (COZAAR) 100 MG tablet Take 1 tablet (100 mg total) by mouth daily. 90 tablet 3   No current facility-administered medications on file prior to visit.    No Known Allergies   Assessment/Plan:  1. Hypertension - Blood pressure significantly elevated above goal of <130/80 in clinic. Also very elevated at home as well. ARB is maxed out and carvedilol cannot be increased due to heart rate. Will start amlodipine 2.5mg  daily. Since losartan tends to be much weaker than other ARBs, will STOP losartan and START irbesartan 300mg  daily. Continue carvedilol 3.125mg  twice a day. BMP at next visit. Patient will start exercising. He will start with 10 min per day and work his way up to 138min per week. I advised him to pay attention to food labels for sodium content. Try to stay under 2300mg  per day. Follow up in clinic in 2 weeks.  Thank you,  Ramond Dial, Pharm.D, BCPS, CPP [facility]  [address], [place], [place] [address]  Phone: [phone]; Fax: [phone]

## 2020-06-22 NOTE — Telephone Encounter (Signed)
Reviewed blood pressure recordings on the last 2 weeks.  They do tend to be a little bit higher than we would like.  Neck step will be to increase amlodipine to 5 mg daily.  This is probably a sign of the heart pumping stronger and better and now your blood pressure is coming back.   We will change prescription to amlodipine 5 mg by mouth daily.  Dispense 30 with 3 refills.  Once we see if this is working correctly, then we can do a long-term refill.  Glenetta Hew, MD

## 2020-07-05 ENCOUNTER — Other Ambulatory Visit: Payer: Self-pay

## 2020-07-05 ENCOUNTER — Telehealth (INDEPENDENT_AMBULATORY_CARE_PROVIDER_SITE_OTHER): Payer: Medicare Other | Admitting: Pharmacist

## 2020-07-05 VITALS — BP 137/75 | HR 78

## 2020-07-05 DIAGNOSIS — I10 Essential (primary) hypertension: Secondary | ICD-10-CM

## 2020-07-05 MED ORDER — AMLODIPINE BESYLATE 10 MG PO TABS: 10.0000 mg | ORAL_TABLET | Freq: Every day | ORAL | 3 refills | Status: DC

## 2020-07-05 NOTE — Progress Notes (Signed)
Patient ID: LENOX BINK                 DOB: 06/03/1941                      MRN: [id number]     HPI: Cesar Bullock is a 80 y.o. male referred by Dr. Herbie Baltimore to HTN clinic. 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 During 06/06/20 visit with Dr. Herbie Baltimore, pt reported elevated BP in the 140-150's. Losartan was increased to 100 mg daily. Due to HR (49 bpm), carvedilol could not be increased. Pt was last seen at HTN clinic on 06/21/2020 for initial appointment. Denied any dizziness, lightheadedness, HA, blurred vision, SOB or swelling. Reported no exercise and limited sodium diet, but eats deli meat for lunch. Reported not drinking alcohol since May (1 drink/night) but thinking about starting to drink again. At clinic BP was significantly elevated at 184/74 mmHg above goal. Pt was started on amlodipine 2.5 mg daily. Pt's losartan 100 mg daily was switched to irbesartan 300 mg daily. Pt continued on carvedilol 3.125 mg BID. F/u BMP ordered. Pt was encouraged to start exercising and to limit sodium content.   Today's follow up was changed to virtual. Patient denies any dizziness/lightheadedness. Minor ankle swelling that resolved with furosemide. He has only needed two doses of furosemide in the last 2 weeks. He has only exercised once since last visit. His home blood pressures are much better, but still high. Some in the 130's but others in the 150's and 160's. Some of his readings are lying down. He has actually been taking a whole tablet of amlodipine (5mg ) instead of 2.5mg .   Current HTN meds: Amlodipine 5 mg daily carvedilol 3.125mg  twice a day Irbesartan 300 mg daily furosemide 40mg  as needed  Previously tried:  Losartan 100 mg - needed more potent ARB  BP goal: <130/80  Family History:  Family History   Problem Relation Age of Onset  . Diabetes Other        GM  . CAD Brother 10       PCI at age 67;   . Stroke Mother 20       Had second stroke at age 39, died age 34  . Cancer Father 69       Reportedly sinus cancer  . Colon cancer Neg Hx   . Prostate cancer Neg Hx      Social History: use to have a drink a day, stopped May 2  Diet: small cup of "expresso" in the AM, green tea 8oz  Salmon, fish 20 sandwich  Exercise: does not exercise  Home BP readings: see my chart message from 07/05/20 with BP readings  Current Labs: 06/06/2020: Scr 0.89, K 4.7  Wt Readings from Last 3 Encounters:  06/06/20 138 lb (62.6 kg)  05/30/20 138 lb 8 oz (62.8 kg)  04/12/20 122 lb 11.2 oz (55.7 kg)   BP Readings from Last 3 Encounters:  06/21/20 (!) 184/78  06/06/20 (!) 162/84  05/30/20 (!) 190/80   Pulse Readings from Last 3 Encounters:  06/21/20 62  06/06/20 (!) 49  05/30/20 64    Renal function: CrCl cannot be calculated (Patient's most recent lab result is older than the maximum 21 days allowed.).  Past Medical History:  Diagnosis Date  . 2019 novel  coronavirus disease (COVID-19) 08/12/2019   -Follow-up test on August 25, 2019 not detected.  Again not detected  both July 13 and 26, 2021  . Adenomatous colon polyp 2016   tubular  . Chronic combined systolic (congestive) and diastolic (congestive) heart failure ([place]) 01/2020   Diagnosed 01/2020 - significant wgt gain - edema; fatigue & SOB/PND/orthopnea. => Echo EF 20-25%, critical AS. --> initially diuresed, then referred for TAVR -> EF up to 50-55% on 2nd Post-TAVR Echo!!!.  Grr II DD. -- NOW mostly HFpEF  . Disequilibrium   . Elevated blood pressure reading without diagnosis of hypertension 05/03/2008   Qualifier: Diagnosis of  By: Larose Kells MD, [place] HLD (hyperlipidemia)   . HOH (hard of hearing)   . Hyperglycemia 11/09/2012  . Hypertension   . Nuclear cataract 2018   Had surgery  . 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 Angelena Form and Dr. Cyndia Bent   . Sensorineural hearing loss, bilateral 2016   Strict hearing loss precautions  . Severe aortic stenosis   . 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     Current Outpatient Medications on File Prior to Visit  Medication Sig Dispense Refill  . amLODipine (NORVASC) 5 MG tablet Take 0.5 tablets (2.5 mg total) by mouth daily. 90 tablet 3  . aspirin EC 81 MG tablet Take 81 mg by mouth daily. Swallow whole.    . carvedilol (COREG) 3.125 MG tablet Take 1 tablet (3.125 mg total) by mouth 2 (two) times daily. 180 tablet 3  . clopidogrel (PLAVIX) 75 MG tablet Take 1 tablet (75 mg total) by mouth daily with breakfast. 90 tablet 1  . furosemide (LASIX) 40 MG tablet Take 1 tablet (40 mg total) by mouth as needed. For tightness in your chest or worsening or shortness of breath 30 tablet 6  . irbesartan (AVAPRO) 300 MG tablet Take 1 tablet (300 mg total) by mouth daily. 90 tablet 3   No current facility-administered medications on file prior to visit.    No Known Allergies   Assessment/Plan:  1. Hypertension - Blood pressure is not at goal, but is improving. Will increase amlodipine to 10mg  daily. Patient will take 2, 5mg  tablets to equal 10mg  until he runs out. Then he will pick up the 10mg  tablets from the pharmacy and start taking 1, 10mg  tablet. Continue carvedilol 3.125mg  twice a day, irbesartan 300 mg daily and furosemide 40mg  as needed. Patient encouraged to start exercising. Follow up in clinic in 3 weeks.  Ramond Dial, Pharm.D, BCPS, CPP [place]  [id number], [place], [place] [address]  Phone: [phone]; Fax: [phone]

## 2020-07-26 ENCOUNTER — Ambulatory Visit: Payer: Medicare Other

## 2020-08-01 ENCOUNTER — Other Ambulatory Visit: Payer: Self-pay

## 2020-08-01 ENCOUNTER — Ambulatory Visit (INDEPENDENT_AMBULATORY_CARE_PROVIDER_SITE_OTHER): Payer: Medicare Other | Admitting: Pharmacist

## 2020-08-01 VITALS — HR 62

## 2020-08-01 DIAGNOSIS — Z79899 Other long term (current) drug therapy: Secondary | ICD-10-CM

## 2020-08-01 DIAGNOSIS — I10 Essential (primary) hypertension: Secondary | ICD-10-CM

## 2020-08-01 NOTE — Progress Notes (Signed)
Patient ID: Cesar Bullock                 DOB: 1940/08/29                      MRN: [id number]     HPI: Cesar Bullock is a 80 y.o. male referred by Dr. Ellyn Hack to HTN clinic. 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   Pt was last seen at HTN clinic on 07/05/20. Blood pressure was improved from previous weeks, but still above goal. Amlodipine was increased to 10mg  daily on 07/19/20.  Patient presents today for follow up. He denies dizziness, lightheadedness, headache, blurred vision, SOB or swelling. He has been exercising a little bit, but nothing routine. Blood pressures at home have been good. Some in the 140's but most recently they have all been <130/80.  He has had some worsening of his heart burn recently. Has been taking pepcid complete. Is helping. Has has heartburn for a long time. Always thought it was from alcohol consumption, but now is not drinking. Has been almost 10 months since last drink. Wants to know if pepcid is ok to take and if his meds are causing the heartburn.  Current HTN meds: Amlodipine 10 mg daily carvedilol 3.125mg  twice a day Irbesartan 300 mg daily furosemide 40mg  as needed  Previously tried:  Losartan 100 mg - needed more potent ARB  BP goal: <130/80  Family History:  Family History  Problem Relation Age of Onset  . Diabetes Other        GM  . CAD Brother 22       PCI at age 9;   . Stroke Mother 55       Had second stroke at age 68, died age 60  . Cancer Father 23       Reportedly sinus cancer  . Colon cancer Neg Hx   . Prostate cancer Neg Hx      Social History: use to have a drink a day, stopped May 2  Diet: small cup of "expresso" in the AM, green tea 8oz  Salmon, fish [place] sandwich  Exercise: does not exercise  Home BP readings: 107/58,  119/69, 133/67, 114/64, 123/70, 119/65, 135/77, 122/60, 132/74, 133/71, 126/73, 126/66, 126/68, 133/65 Current Labs: 06/06/2020: Scr 0.89, K 4.7  Wt Readings from Last 3 Encounters:  06/06/20 138 lb (62.6 kg)  05/30/20 138 lb 8 oz (62.8 kg)  04/12/20 122 lb 11.2 oz (55.7 kg)   BP Readings from Last 3 Encounters:  07/05/20 137/75  06/21/20 (!) 184/78  06/06/20 (!) 162/84   Pulse Readings from Last 3 Encounters:  07/05/20 78  06/21/20 62  06/06/20 (!) 49    Renal function: CrCl cannot be calculated (Patient's most recent lab result is older than the maximum 21 days allowed.).  Past Medical History:  Diagnosis Date  . 2019 novel coronavirus disease (COVID-19) 08/12/2019   -Follow-up test on August 25, 2019 not detected.  Again not detected  both July 13 and 26, 2021  . Adenomatous colon polyp 2016   tubular  . Chronic combined systolic (congestive) and diastolic (congestive) heart failure ([place]) 01/2020   Diagnosed 01/2020 - significant wgt gain - edema; fatigue & SOB/PND/orthopnea. => Echo EF 20-25%, critical AS. --> initially diuresed, then referred for TAVR -> EF  up to 50-55% on 2nd Post-TAVR Echo!!!.  Grr II DD. -- NOW mostly HFpEF  . Disequilibrium   . Elevated blood pressure reading without diagnosis of hypertension 05/03/2008   Qualifier: Diagnosis of  By: Larose Kells MD, [place] HLD (hyperlipidemia)   . HOH (hard of hearing)   . Hyperglycemia 11/09/2012  . Hypertension   . Nuclear cataract 2018   Had surgery  . 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 Angelena Form and Dr. Cyndia Bent   . Sensorineural hearing loss, bilateral 2016   Strict hearing loss precautions  . Severe aortic stenosis   . 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     Current Outpatient Medications  on File Prior to Visit  Medication Sig Dispense Refill  . amLODipine (NORVASC) 10 MG tablet Take 1 tablet (10 mg total) by mouth daily. 90 tablet 3  . aspirin EC 81 MG tablet Take 81 mg by mouth daily. Swallow whole.    . carvedilol (COREG) 3.125 MG tablet Take 1 tablet (3.125 mg total) by mouth 2 (two) times daily. 180 tablet 3  . clopidogrel (PLAVIX) 75 MG tablet Take 1 tablet (75 mg total) by mouth daily with breakfast. 90 tablet 1  . furosemide (LASIX) 40 MG tablet Take 1 tablet (40 mg total) by mouth as needed. For tightness in your chest or worsening or shortness of breath 30 tablet 6  . irbesartan (AVAPRO) 300 MG tablet Take 1 tablet (300 mg total) by mouth daily. 90 tablet 3   No current facility-administered medications on file prior to visit.    No Known Allergies   Assessment/Plan:  1. Hypertension - Blood pressure is not at goal in clinic today, but has been in goal at home. Continue amlodipine 10 mg daily, carvedilol 3.125mg  twice a day, irbesartan 300 mg daily and furosemide 40mg  as needed. Discussed with patient that pepcid complete was ok to take with his other medications. Heartburn was reported in a small amount of patients. Unclear if this is the cause. Could not pinpoint when it got worse. He also doesn't remember if he is taking irbesartan or losartan. He will check bottles when he gets home and let me know. Follow up via telephone in 1 month.  2. Plavix- Patient also asked about when he can stop plavix. He is to have a colonoscopy and also hernia surgery. Advised per Joellen Jersey Thompson's note that he can stop plavix in march 2022, 6 months after surgery.   Ramond Dial, Pharm.D, BCPS, CPP [place]  [address] [place], [place] [address]  Phone: [phone]; Fax: [phone]

## 2020-08-01 NOTE — Patient Instructions (Signed)
Your blood pressure goal is <130/80  Please continue: Amlodipine 10 mg daily carvedilol 3.125mg  twice a day Irbesartan 300 mg daily furosemide 40mg  as needed  Call me at [phone] with any questions or if BP is >130/80 consistently

## 2020-08-01 NOTE — Progress Notes (Signed)
Thank you :)

## 2020-08-02 ENCOUNTER — Telehealth: Payer: Self-pay | Admitting: Physician Assistant

## 2020-08-02 LAB — BASIC METABOLIC PANEL
BUN/Creatinine Ratio: 23 (ref 10–24)
BUN: 22 mg/dL (ref 8–27)
CO2: 25 mmol/L (ref 20–29)
Calcium: 9.6 mg/dL (ref 8.6–10.2)
Chloride: 105 mmol/L (ref 96–106)
Creatinine, Ser: 0.97 mg/dL (ref 0.76–1.27)
GFR calc Af Amer: 87 mL/min/{1.73_m2} (ref >59)
GFR calc non Af Amer: 75 mL/min/{1.73_m2} (ref >59)
Glucose: 87 mg/dL (ref 65–99)
Potassium: 4.6 mmol/L (ref 3.5–5.2)
Sodium: 142 mmol/L (ref 134–144)

## 2020-08-02 NOTE — Telephone Encounter (Signed)
Pt has been notified of lab results by phone with verbal understanding. Pt thanked me for the call. Pt said to let Angelena Form, [facility] he said hi. I assured the pt that I sure will. The patient has been notified of the result and verbalized understanding.  All questions (if any) were answered. Julaine Hua, [facility] 08/02/2020 10:17 AM

## 2020-08-02 NOTE — Telephone Encounter (Signed)
Cesar Bullock states he is returning a missed call from our office. Please advise.

## 2020-08-04 DIAGNOSIS — R1032 Left lower quadrant pain: Secondary | ICD-10-CM | POA: Diagnosis not present

## 2020-08-08 ENCOUNTER — Telehealth: Payer: Self-pay | Admitting: *Deleted

## 2020-08-08 NOTE — Telephone Encounter (Signed)
   Primary Cardiologist: Glenetta Hew, MD  Chart reviewed as part of pre-operative protocol coverage. Patient was contacted 08/08/2020 in reference to pre-operative risk assessment for pending surgery as outlined below.  Patient states he will have an ultrasound first to determine need for surgery. He would like to defer clearance at this time. He is status post TAVR. He may not hold plavix until September 12 2020.  If surgery is planned and clearance is needed, please reach back out to [facility].   I will remove from our preop pool.      Tami Lin [place], PA 08/08/2020, 11:22 AM

## 2020-08-08 NOTE — Telephone Encounter (Signed)
   [facility] Pre-operative Risk Assessment    HEARTCARE STAFF: - Please ensure there is not already an duplicate clearance open for this procedure. - Under Visit Info/Reason for Call, type in Other and utilize the format Clearance MM/DD/YY or Clearance TBD. Do not use dashes or single digits. - If request is for dental extraction, please clarify the # of teeth to be extracted.  Request for surgical clearance:  1. What type of surgery is being performed? INGUINAL HERNIA REPAIR   2. When is this surgery scheduled? TBD   3. What type of clearance is required (medical clearance vs. Pharmacy clearance to hold med vs. Both)? MEDICAL  4. Are there any medications that need to be held prior to surgery and how long? PLAVIX    5. Practice name and name of physician performing surgery? [facility]; DR. Harrell Gave WHITE   6. What is the office phone number? [phone]   7.   What is the office fax number? [place]: Mammie Lorenzo, LPN  8.   Anesthesia type (None, local, MAC, general) ? GENERAL   Julaine Hua 08/08/2020, 9:08 AM  _________________________________________________________________   (provider comments below)

## 2020-08-09 ENCOUNTER — Other Ambulatory Visit: Payer: Self-pay | Admitting: Surgery

## 2020-08-09 DIAGNOSIS — R1032 Left lower quadrant pain: Secondary | ICD-10-CM

## 2020-08-13 ENCOUNTER — Other Ambulatory Visit: Payer: Self-pay

## 2020-08-14 MED ORDER — CARVEDILOL 3.125 MG PO TABS: 3.1250 mg | ORAL_TABLET | Freq: Two times a day (BID) | ORAL | 3 refills | Status: DC

## 2020-08-17 ENCOUNTER — Ambulatory Visit (INDEPENDENT_AMBULATORY_CARE_PROVIDER_SITE_OTHER): Payer: Medicare Other | Admitting: Cardiology

## 2020-08-17 ENCOUNTER — Other Ambulatory Visit: Payer: Self-pay

## 2020-08-17 VITALS — BP 128/72 | HR 68 | Ht 70.0 in | Wt 148.7 lb

## 2020-08-17 DIAGNOSIS — I428 Other cardiomyopathies: Secondary | ICD-10-CM | POA: Diagnosis not present

## 2020-08-17 DIAGNOSIS — I5023 Acute on chronic systolic (congestive) heart failure: Secondary | ICD-10-CM

## 2020-08-17 DIAGNOSIS — I350 Nonrheumatic aortic (valve) stenosis: Secondary | ICD-10-CM

## 2020-08-17 DIAGNOSIS — E7849 Other hyperlipidemia: Secondary | ICD-10-CM | POA: Diagnosis not present

## 2020-08-17 DIAGNOSIS — I10 Essential (primary) hypertension: Secondary | ICD-10-CM | POA: Diagnosis not present

## 2020-08-17 DIAGNOSIS — Z952 Presence of prosthetic heart valve: Secondary | ICD-10-CM | POA: Diagnosis not present

## 2020-08-17 DIAGNOSIS — J90 Pleural effusion, not elsewhere classified: Secondary | ICD-10-CM | POA: Diagnosis not present

## 2020-08-17 NOTE — Patient Instructions (Addendum)
Medication Instructions:   NO CHANGES   *If you need a refill on your cardiac medications before your next appointment, please call your pharmacy*   Lab Work: LIPID  CMP - MARCH 2022 If you have labs (blood work) drawn today and your tests are completely normal, you will receive your results only by: Marland Kitchen MyChart Message (if you have MyChart) OR . A paper copy in the mail If you have any lab test that is abnormal or we need to change your treatment, we will call you to review the results.   Testing/Procedures:  NOT NEEDED  Follow-Up: At [facility], you and your health needs are our priority.  As part of our continuing mission to provide you with exceptional heart care, we have created designated Provider Care Teams.  These Care Teams include your primary Cardiologist (physician) and Advanced Practice Providers (APPs -  Physician Assistants and Nurse Practitioners) who all work together to provide you with the care you need, when you need it.     Your next appointment:   6  month(s)  The format for your next appointment:   In Person  Provider:   Glenetta Hew, MD

## 2020-08-17 NOTE — Progress Notes (Signed)
Primary Care Provider: Michael Boston, MD Cardiologist: Glenetta Hew, MD  TAVR Team: Dr. Angelena Form (Cards) &Dr. Cyndia Bent (CVTS) Electrophysiologist: None  Clinic Note: Chief Complaint  Patient presents with  . Follow-up    Hypertension well-controlled   . Cardiac Valve Problem    5 months post TAVR.  Doing wonderfully   ===================================  ASSESSMENT/PLAN   Problem List Items Addressed This Visit    Pleural effusion, bilateral    Cleared up on chest x-ray.  No further symptoms.      S/P TAVR (transcatheter aortic valve replacement)    Remarkable affect following TAVR.  Symptomatically, clinically and by echo all showing just how well he is doing.  He is almost back to his baseline level of activity.  I rechecked a chest x-ray just to confirm no further pleural effusion noted.  EF on echo is back in the low normal range.  No significant valvular disease now.  Now regular with HFpEF which seems to be relatively stable NYHA class I-II symptoms..      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  This is simply with adding a few medicines and TAVR.  We are now actually trying to treat his blood pressure for hyper as opposed to hypotension.  HFpEF with NYHA class I-II symptoms at this point.  Plan: Continue afterload reduction with irbesartan and amlodipine.  He is on carvedilol, but I am reluctant to titrate his dose much further with resting heart rate in the 60s. He is taking half a tablet to a full tablet of Lasix a few days a week, but on as-needed basis.      Relevant Orders   Lipid panel   Comprehensive metabolic panel   Essential hypertension (Chronic)    Blood pressure checks at home look great on current meds.  He is continue to follow his pressures.  He does have maintenance follow-up intermittently  with CVRR hypertension clinic.  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  Remains on Plavix      HLD (hyperlipidemia) (Chronic)    Last set of labs were in 2016.  Need to get new labs.  He ate today.  We will ask for him to have lipid and chemistry panel checked in March.  Titrate orders make decisions on medications at that time.        ===================================  HPI:    RODOLFO GASTER is a 80 y.o. male with a PMH notable for recent diagnosis of SEVERE AS W/ NONISCHEMIC CARDIOMYOPATHYCOMBINED, ACUTE COMBINED SYSTOLIC AND DIASTOLIC HEART FAILURE EF 20 to 25%)- s/p URGENT TAVR (with more markable recovery-EF now 64 to 55%) who presents today for 35-monthfollow-up to discuss his blood pressure levels..Chevis PrettyCumins was initially seen on February 07, 2020 with symptoms of severe CHF acute combined systolic and diastolic heart failure secondary to severe aortic stenosis.  He was seen in short follow-up prolonged discussion, he reluctantly agreed to proceed with pre-TAVR evaluation with right  and left heart catheterization on August 18.  03/14/2020-TAVR along with right thoracentesis-drainage of roughly 1 L-> since then he is done remarkably well.  His ejection fraction has returned to almost normal.  He actually has been hypertensive and reinitiation of antihypertensive agents.  Zio patch ordered post TAVR because of 1 degree AVB and LBBB on EKG => no significant CHB or HAVB, or bradycardia.  Rare isolated PACs and PVCs. 4 Short 4-6 beat runs of PAT.  Rates ranged from 99-135 bpm.  Echo 04/12/2020: EF 50 to 55%. GRII DD. Moderate LA dilation. Normal PA pressures. Normal functioning  TAVR valve. No PVL. No significant stenosis.  Latron Ribas Pellecchia was last seen by me on June 06, 2020.  He was in great spirits.  His skin color is back to normal.  He was very happy.  Still having a little bit of fatigue over the last few days and maybe little bit of pressure sensation in his chest.  He had not yet taken the full 100 mg losartan dose.  He was beside himself on how while he was doing 3 months post TAVR.  Now getting back to almost routine level of walking.  His energy level is almost getting back to normal.  He still had lots of stress in his company having the with COVID-19 and that has him stressed out a little bit, so he feels occasional palpitations off and on.  But otherwise doing well.  He was finally starting to regain some of his muscle mass weight. -> He has been following his blood pressures religiously using his iPhone app ([employer]) to track his blood pressure heart rate and weight.  BP readings were usually in the 140s to 150s.  IncreaseD losartan to 100 mg daily.  Also mention taking as needed Lasix 40 mg daily for either dyspnea or chest tightness.  BNP and BMP ordered  Because of swelling and dyspnea, BNP and BMP ordered  Chest x-ray ordered -> no focal consolidation, pneumothorax or pleural effusion noted.  Recent Hospitalizations: None since TAVR  Reviewed  CV studies:    The following studies were reviewed today: (if available, images/films reviewed: From Epic Chart or Care Everywhere) . None:  Interval History:   Zale Marcotte Jeffcoat presents today for 65-monthfollow-up overall stating that he feels much better.  He really does not note DES chest tightness and shortness of breath symptom that he had.  His swelling seems to be off and on ankle swelling that is usually well controlled by taking it as needed Lasix.  He is trying to watch his salt intake while staying adequately hydrated.  No real PND orthopnea.  He has not had any chest pain or pressure with rest or  exertion.  No palpitations.  No syncope or near syncope.  He brings his blood pressure readings that range anywhere from 96/52 up to 130/67.  Heart rates are mostly in the 60s and 70s.  His weight has been roughly around 139 pounds at home.  CV Review of Symptoms (Summary) Cardiovascular ROS: positive for - edema and Still not fully back of the full energy level, mild exercise intolerance, but likely because of deconditioning from being ill. negative for - chest pain, irregular heartbeat, orthopnea, palpitations, paroxysmal nocturnal dyspnea, rapid heart rate, shortness of breath or Lightheadedness, dizziness, syncope/near syncope or TIA/amaurosis fugax, claudication  The patient does not have symptoms concerning for COVID-19 infection (fever, chills, cough, or new shortness of breath).   REVIEWED OF SYSTEMS  Review of Systems  Constitutional: Negative for malaise/fatigue and weight loss (With finally starting bili maintain his stable weights now gaining weight back).  HENT: Negative for congestion and sinus pain.   Respiratory: Negative for cough and shortness of breath.   Cardiovascular: Positive for leg swelling (Off-and-on, occasional.  He takes PRN Lasix maybe 1/2 tablet, but is trying to reduce his salt intake and elevate feet.). Negative for chest pain.  Gastrointestinal: Negative for blood in stool and melena.       He does have some left inguinal discomfort.  Apparently there is an ultrasound pending to look for possible hernia.  Genitourinary: Negative for dysuria and hematuria.  Musculoskeletal: Negative for falls and joint pain.  Neurological: Negative for dizziness, weakness (Overall strength improved) and headaches.  Psychiatric/Behavioral: Negative for depression and memory loss. The patient is not nervous/anxious and does not have insomnia.    I have reviewed and (if needed) personally updated the patient's problem list, medications, allergies, past medical and surgical  history, social and family history.   PAST MEDICAL HISTORY   Past Medical History:  Diagnosis Date  . 2019 novel coronavirus disease (COVID-19) 08/12/2019   -Follow-up test on August 25, 2019 not detected.  Again not detected  both July 13 and 26, 2021  . Adenomatous colon polyp 2016   tubular  . Chronic combined systolic (congestive) and diastolic (congestive) heart failure ([place]) 01/2020   Diagnosed 01/2020 - significant wgt gain - edema; fatigue & SOB/PND/orthopnea. => Echo EF 20-25%, critical AS. --> initially diuresed, then referred for TAVR -> EF up to 50-55% on 2nd Post-TAVR Echo!!!.  Grr II DD. -- NOW mostly HFpEF  . Disequilibrium   . Elevated blood pressure reading without diagnosis of hypertension 05/03/2008   Qualifier: Diagnosis of  By: Larose Kells MD, [place] HLD (hyperlipidemia)   . HOH (hard of hearing)   . Hyperglycemia 11/09/2012  . Hypertension   . Nuclear cataract 2018   Had surgery  . 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 Angelena Form and Dr. Cyndia Bent   . Sensorineural hearing loss, bilateral 2016   Strict hearing loss precautions  . Severe aortic stenosis   . Valvular cardiomyopathy ([place]) 01/2020   EF 20-25% with Critical/Severe AS --> Almost completely resolved -> EF post TAVR 50-55%, Gr II DD.     PAST SURGICAL HISTORY   Past Surgical History:  Procedure Laterality Date  . 14d ZIO PATCH - CARDIAC EVENT MONITOR  03/2020   s/p TAVR: Predom: SR - min HR 49 bpm, Max 113 bpm, Avg 64 bpm. LBBB and 1  AVB.  Rare isolated PACs and PVCs.  PAC couplets and triplets noted.  For short runs of SVT 4-6 beats: Longest was 6 beats at 99 bpm, fastest was 5 beats at 135 bpm).  No evidence of CHB or HAVB.  Marland Kitchen BUBBLE STUDY  02/16/2020   Procedure: BUBBLE STUDY;  Surgeon: Elouise Munroe, MD;  Location: [facility] ENDOSCOPY;  Service: Cardiology;;  . Cataract surgery  2018    Dr. Calvert Cantor  . EYE SURGERY Bilateral    cataract  . IR THORACENTESIS ASP PLEURAL SPACE W/IMG GUIDE  03/15/2020   Right side Pl. Effusion  . RIGHT/LEFT HEART CATH AND CORONARY ANGIOGRAPHY N/A 02/16/2020   Procedure: RIGHT/LEFT HEART CATH AND CORONARY ANGIOGRAPHY;  Surgeon: Leonie Man, MD;  Location: [facility] INVASIVE CV LAB;; PRE-TAVR: Angiograpnhically NORMAL CORONARIES: Severe Valvular CM (EF ~25%, CO/CI 3.25 /1.82) Mod-Severe Pulm HTN (PAP-mean: 67/25 mmHg - 41 mmHg; PCPWP 27 mHg.  AoP 136/74 mmHg - MAP 99 mmHg. RVP-EDP: 67/4 mmHg - 9 mmHg, RAP 6 mmHg - mod RA dilation)  . TEE WITHOUT CARDIOVERSION N/A 02/16/2020   Procedure: TRANSESOPHAGEAL ECHOCARDIOGRAM (TEE);  Surgeon: Elouise Munroe, MD;  Location: [facility];  Service: Cardiology;  Laterality: N/A;  . TEE WITHOUT CARDIOVERSION N/A 03/14/2020   Procedure: TRANSESOPHAGEAL ECHOCARDIOGRAM (TEE) - INRA-OP TAVR;  Surgeon: Ena Dawley, MD;  Location: [facility] CV LAB;  (Large left pleural effusion -> thoracentesis of 1.1 L); TransAoV gradients improved from 65/40 mmHg to 9/6 mmHg post TAVR deployment. --> EF improved from 25-30% to 40-45%. Mod LA dilation & mild RA dilation. Mild-mod MR; Large L Lateral pleural effusion.   . TONSILLECTOMY     age 37  . TRANSCATHETER AORTIC VALVE REPLACEMENT, TRANSFEMORAL N/A 03/14/2020   Procedure: TRANSCATHETER AORTIC VALVE REPLACEMENT, TRANSFEMORAL;  Surgeon: Burnell Blanks, MD;  Location: [facility] INVASIVE CV LAB;  26 mm Edwards Sapien 3U THV - via TFA approach. AoP 101/47 mmHg (MAP 68 mmHg) - LVP-EDP 180/10 mmHg - 21 mmHg.  Marland Kitchen TRANSTHORACIC ECHOCARDIOGRAM  03/2017   [facility]): Mild LVH with normal LV contraction (LVEF 50%). Severely calcified aortic valve with mild regurgitation and moderate-severe stenosis (AoV velocity 4.4 m/s, AVA 0.6 cm^2). Mitral calcification with mild MR. Mild pulmonary hypertension. Normal RV size and function.  . TRANSTHORACIC ECHOCARDIOGRAM  07/19/2015     Normal LV size with mild LVH. LVEF 55-60% with grade 2 diastolic dysfunction. Moderate AS and mild AI. MAC with mild MR. Mild LA enlargement. Normal RV size and function.  . TRANSTHORACIC ECHOCARDIOGRAM  02/11/2020   EF 25-30% with severely reduced function global).  Moderately elevated PA pressures (~53 mmHg).  Severe AS with mild to moderate AI (AVA by VTI ~0.49 cm, mean gradient 41 mmHg. V max 3.95ms; AI py PHT 465 msec)  . TRANSTHORACIC ECHOCARDIOGRAM  03/15/2020   POD #1 - s/p TAVR: Normal TAVR valve.  Mean gradient 11 mmHg.  No PVL.  EF improved to 40 to 45% with global HK.  Moderate concentric LVH.  GRII DD.  Large left pleural effusion with atelectasis.  Severe LA dilation.  Mild RA dilation.  Mild MR.  . TRANSTHORACIC ECHOCARDIOGRAM  04/12/2020   1 month s/p TAVR: EF 50 to 55%.  GRII DD.  Moderate LA dilation.  Normal PA pressures.  Normal functioning TAVR valve.  No PVL.  No significant stenosis.   ? Zio patch because of LBBB and 1  AVB - poat TAVR   Predom: SR - min HR 49 bpm, Max 113 bpm, Avg 64 bpm. LBBB and 1  AVB.  Rare isolated PACs and PVCs.  PAC couplets and triplets noted.  For short runs of SVT 4-6 beats: Longest was 6 beats at 99 bpm, fastest was 5 beats at 135 bpm).  No evidence of CHB or HAVB.  R&LHC 02/16/2020: Severe Aortic Stenosis; Severe Valvular Cardiomyopathy - EF ~25% & CO/CI: 3.25/1.82; Moderate to Severe Pulmonary Hypertension  Angiographically Normal Coronary Arteries - Right Dominant   Immunization History  Administered Date(s) Administered  . PFIZER(Purple Top)SARS-COV-2 Vaccination 08/27/2019, 09/21/2019    MEDICATIONS/ALLERGIES   Current Meds  Medication Sig  . amLODipine (NORVASC) 10 MG tablet Take 1 tablet (10 mg total) by mouth daily.  .Marland Kitchenaspirin EC 81 MG tablet  Take 81 mg by mouth daily. Swallow whole.  . carvedilol (COREG) 3.125 MG tablet Take 1 tablet (3.125 mg total) by mouth 2 (two) times daily.  . furosemide (LASIX) 40 MG tablet Take  1 tablet (40 mg total) by mouth as needed. For tightness in your chest or worsening or shortness of breath  . irbesartan (AVAPRO) 300 MG tablet Take 1 tablet (300 mg total) by mouth daily.  . [DISCONTINUED] clopidogrel (PLAVIX) 75 MG tablet Take 1 tablet (75 mg total) by mouth daily with breakfast.    No Known Allergies  SOCIAL HISTORY/FAMILY HISTORY   Reviewed in Epic:  Pertinent findings:  Social History   Tobacco Use  . Smoking status: Never Smoker  . Smokeless tobacco: Never Used  Vaping Use  . Vaping Use: Never used  Substance Use Topics  . Alcohol use: Not Currently    Alcohol/week: 6.0 standard drinks    Types: 6 Shots of liquor per week    Comment: vodka when he stopped drinking roughly 63moago 05/30/2020  . Drug use: No   Social History   Social History Narrative   Lives by himself   6 g-kids      Former [employer]      Exercise-minimal   Quit drinking alcohol in March 2021    OBJCTIVE -PE, EKG, labs   Wt Readings from Last 3 Encounters:  08/17/20 148 lb 11.2 oz (67.4 kg)  06/06/20 138 lb (62.6 kg)  05/30/20 138 lb 8 oz (62.8 kg)    Physical Exam: BP 128/72 (BP Location: Left Arm, Patient Position: Sitting)   Pulse 68   Ht _0  (1.778 m)   Wt 148 lb 11.2 oz (67.4 kg)   SpO2 98%   BMI 21.34 kg/m  Physical Exam Vitals reviewed.  Constitutional:      General: He is not in acute distress.    Appearance: Normal appearance. He is normal weight. He is not ill-appearing or toxic-appearing.     Comments: Remarkably healthy-appearing gentleman.  Well-nourished and well-groomed.  Skin tone normal.  Demeanor and outlook extremely improved.  Smiling.  HENT:     Head: Normocephalic and atraumatic.  Neck:     Vascular: No carotid bruit (Soft radiated aortic murmur), hepatojugular reflux or JVD.  Cardiovascular:     Rate and Rhythm: Normal rate and regular rhythm. Occasional extrasystoles are present.    Chest Wall: PMI is not displaced.      Pulses: Normal pulses.     Heart sounds: S1 normal and S2 normal. Murmur heard.   Harsh crescendo-decrescendo early systolic murmur is present with a grade of 1/6 at the upper right sternal border radiating to the neck. No friction rub. No gallop.   Pulmonary:     Effort: Pulmonary effort is normal. No respiratory distress.     Breath sounds: Normal breath sounds.  Chest:     Chest wall: No tenderness.  Musculoskeletal:        General: No swelling. Normal range of motion.     Cervical back: Normal range of motion and neck supple.  Skin:    General: Skin is warm and dry.     Coloration: Skin is not pale.  Neurological:     General: No focal deficit present.     Mental Status: He is alert and oriented to person, place, and time.     Motor: No weakness.     Gait: Gait normal.  Psychiatric:  Mood and Affect: Mood normal.        Behavior: Behavior normal.        Thought Content: Thought content normal.        Judgment: Judgment normal.     Adult ECG Report N/A  Recent Labs:  n/a Lab Results  Component Value Date   CHOL 232 (H) 06/12/2015   HDL 63.70 06/12/2015   LDLCALC 148 (H) 06/12/2015   LDLDIRECT 146.5 11/09/2012   TRIG 99.0 06/12/2015   CHOLHDL 4 06/12/2015   Lab Results  Component Value Date   CREATININE 0.97 08/01/2020   BUN 22 08/01/2020   NA 142 08/01/2020   K 4.6 08/01/2020   CL 105 08/01/2020   CO2 25 08/01/2020   CBC Latest Ref Rng & Units 03/15/2020 03/14/2020 03/14/2020  WBC 4.0 - 10.5 K/uL 7.5 - -  Hemoglobin 13.0 - 17.0 g/dL 10.4(L) 10.9(L) 10.5(L)  Hematocrit 39.0 - 52.0 % 33.0(L) 32.0(L) 31.0(L)  Platelets 150 - 400 K/uL 134(L) - -    Lab Results  Component Value Date   TSH 2.67 06/12/2015    ==================================================  COVID-19 Education: The signs and symptoms of COVID-19 were discussed with the patient and how to seek care for testing (follow up with PCP or arrange E-visit).   The importance of social  distancing and COVID-19 vaccination was discussed today. The patient is practicing social distancing & Masking.   I spent a total of 52 minutes with the patient spent in direct patient consultation.  And usually has lots of questions and concerns I see him.  He brings a detailed spreadsheet of his weights and blood pressures.  He talks about what level of activity he can do, he has multiple questions about several different issues that take quite some time to answer. Additional time spent with chart review  / charting (studies, outside notes, etc): 12 min Total Time: 64 min  Current medicines are reviewed at length with the patient today.  (+/- concerns) N/A  This visit occurred during the SARS-CoV-2 public health emergency.  Safety protocols were in place, including screening questions prior to the visit, additional usage of staff PPE, and extensive cleaning of exam room while observing appropriate contact time as indicated for disinfecting solutions.  Notice: This dictation was prepared with Dragon dictation along with smaller phrase technology. Any transcriptional errors that result from this process are unintentional and may not be corrected upon review.  Patient Instructions / Medication Changes & Studies & Tests Ordered   Patient Instructions  Medication Instructions:   NO CHANGES   *If you need a refill on your cardiac medications before your next appointment, please call your pharmacy*   Lab Work: LIPID  CMP - MARCH 2022 If you have labs (blood work) drawn today and your tests are completely normal, you will receive your results only by: Marland Kitchen MyChart Message (if you have MyChart) OR . A paper copy in the mail If you have any lab test that is abnormal or we need to change your treatment, we will call you to review the results.   Testing/Procedures:  NOT NEEDED  Follow-Up: At [facility], you and your health needs are our priority.  As part of our continuing mission to  provide you with exceptional heart care, we have created designated Provider Care Teams.  These Care Teams include your primary Cardiologist (physician) and Advanced Practice Providers (APPs -  Physician Assistants and Nurse Practitioners) who all work together to provide you with the care  you need, when you need it.     Your next appointment:   6  month(s)  The format for your next appointment:   In Person  Provider:   Glenetta Hew, MD     Studies Ordered:   Orders Placed This Encounter  Procedures  . Lipid panel  . Comprehensive metabolic panel     Glenetta Hew, M.D., M.S. Interventional Cardiologist   Pager # [phone] Phone # [phone] [address]. [place],  [address]   Thank you for choosing Heartcare at [facility]!!

## 2020-08-23 ENCOUNTER — Ambulatory Visit
Admission: RE | Admit: 2020-08-23 | Discharge: 2020-08-23 | Disposition: A | Discharge status: Home / Self Care | Payer: Medicare Other | Source: Ambulatory Visit | Attending: Surgery | Admitting: Surgery

## 2020-08-23 DIAGNOSIS — R1032 Left lower quadrant pain: Secondary | ICD-10-CM

## 2020-08-23 DIAGNOSIS — R102 Pelvic and perineal pain: Secondary | ICD-10-CM | POA: Diagnosis not present

## 2020-08-27 ENCOUNTER — Other Ambulatory Visit: Payer: Self-pay | Admitting: Physician Assistant

## 2020-08-28 NOTE — Telephone Encounter (Signed)
This is Dr. Harding's pt. °

## 2020-08-29 ENCOUNTER — Other Ambulatory Visit: Payer: Self-pay

## 2020-08-29 ENCOUNTER — Telehealth (INDEPENDENT_AMBULATORY_CARE_PROVIDER_SITE_OTHER): Payer: Medicare Other | Admitting: Pharmacist

## 2020-08-29 DIAGNOSIS — I10 Essential (primary) hypertension: Secondary | ICD-10-CM

## 2020-08-29 NOTE — Progress Notes (Signed)
Patient ID: NOAL ABSHIER                 DOB: [birth year]                      MRN: [id number]     HPI: Cesar Bullock is a 80 y.o. male referred by Dr. Ellyn Hack to HTN clinic. 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   Pt was last seen at HTN clinic on 08/01/20. Blood pressure was a little high in clinic, but home readings were at goal. Blood pressure readings since that visit have been at goal as well. BP at visit with Dr. Ellyn Hack on 2/17 was 128/72.  Follow up today is done via telephone. Patient denies dizziness, lightheadedness, headache, blurred vision, SOB or swelling. Blood pressures were sent prior to visit via mychart and have been reviewed.   Current HTN meds: Amlodipine 10 mg daily carvedilol 3.125mg  twice a day Irbesartan 300 mg daily furosemide 40mg  as needed  Previously tried:  Losartan 100 mg - needed more potent ARB  BP goal: <130/80  Family History:  Family History  Problem Relation Age of Onset  . Diabetes Other        GM  . CAD Brother 43       PCI at age 39;   . Stroke Mother 62       Had second stroke at age 59, died age 71  . Cancer Father 21       Reportedly sinus cancer  . Colon cancer Neg Hx   . Prostate cancer Neg Hx      Social History: use to have a drink a day, stopped May 2  Diet: small cup of "expresso" in the AM, green tea 8oz  Salmon, fish [place] sandwich  Exercise: bike and treadmill on occasion- not complete routine  Home BP readings: 107/58, 119/69, 133/67, 114/64, 123/70, 119/65, 135/77, 122/60, 132/74, 133/71, 126/73, 126/66, 126/68, 133/65 Current Labs: 06/06/2020: Scr 0.89, K 4.7  Wt Readings from Last 3 Encounters:  08/17/20 148 lb 11.2 oz (67.4 kg)  06/06/20 138 lb (62.6 kg)  05/30/20 138 lb 8 oz (62.8 kg)   BP Readings from Last 3  Encounters:  08/17/20 128/72  07/05/20 137/75  06/21/20 (!) 184/78   Pulse Readings from Last 3 Encounters:  08/17/20 68  08/01/20 62  07/05/20 78    Renal function: CrCl cannot be calculated (Patient's most recent lab result is older than the maximum 21 days allowed.).  Past Medical History:  Diagnosis Date  . 2019 novel coronavirus disease (COVID-19) 08/12/2019   -Follow-up test on August 25, 2019 not detected.  Again not detected  both July 13 and 26, 2021  . Adenomatous colon polyp 2016   tubular  . Chronic combined systolic (congestive) and diastolic (congestive) heart failure ([place]) 01/2020   Diagnosed 01/2020 - significant wgt gain - edema; fatigue & SOB/PND/orthopnea. => Echo EF 20-25%, critical AS. --> initially diuresed, then referred for TAVR -> EF up to 50-55% on 2nd Post-TAVR Echo!!!.  Grr II DD. -- NOW mostly HFpEF  . Disequilibrium   . Elevated blood pressure reading without diagnosis of hypertension 05/03/2008   Qualifier: Diagnosis of  By: Larose Kells MD, [facility] (hyperlipidemia)   . HOH (hard of hearing)   .  Hyperglycemia 11/09/2012  . Hypertension   . Nuclear cataract 2018   Had surgery  . 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 Angelena Form and Dr. Cyndia Bent   . Sensorineural hearing loss, bilateral 2016   Strict hearing loss precautions  . Severe aortic stenosis   . 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     Current Outpatient Medications on File Prior to Visit  Medication Sig Dispense Refill  . amLODipine (NORVASC) 10 MG tablet Take 1 tablet (10 mg total) by mouth daily. 90 tablet 3  . aspirin EC 81 MG tablet Take 81 mg by mouth daily. Swallow whole.    . carvedilol (COREG) 3.125 MG tablet Take 1 tablet (3.125 mg total) by mouth 2 (two) times  daily. 180 tablet 3  . clopidogrel (PLAVIX) 75 MG tablet TAKE 1 TABLET (75 MG TOTAL) BY MOUTH DAILY WITH BREAKFAST. 90 tablet 1  . furosemide (LASIX) 40 MG tablet Take 1 tablet (40 mg total) by mouth as needed. For tightness in your chest or worsening or shortness of breath 30 tablet 6  . irbesartan (AVAPRO) 300 MG tablet Take 1 tablet (300 mg total) by mouth daily. 90 tablet 3   No current facility-administered medications on file prior to visit.    No Known Allergies   Assessment/Plan:  1. Hypertension - Blood pressures at home have been pretty consistently at goal. Continue amlodipine 10 mg daily, carvedilol 3.125mg  twice a day and irbesartan 300 mg daily. Follow up as needed.  Thank you,   Ramond Dial, Pharm.D, BCPS, CPP [facility]  [address] [place], [place] [address]  Phone: [phone]; Fax: [phone]

## 2020-09-10 ENCOUNTER — Encounter: Payer: Self-pay | Admitting: Cardiology

## 2020-09-10 NOTE — Assessment & Plan Note (Signed)
Blood pressure checks at home look great on current meds.  He is continue to follow his pressures.  He does have maintenance follow-up intermittently with [facility] hypertension clinic.  For now would not make any changes but will refill irbesartan

## 2020-09-10 NOTE — Assessment & Plan Note (Signed)
This clearly caused significant cardiomyopathy, but now that he is post TAVR, cardiac function is all but resolved.  Remains on Plavix

## 2020-09-10 NOTE — Assessment & Plan Note (Signed)
Last set of labs were in 2016.  Need to get new labs.  He ate today.  We will ask for him to have lipid and chemistry panel checked in March.  Titrate orders make decisions on medications at that time.

## 2020-09-10 NOTE — Assessment & Plan Note (Addendum)
Remarkable affect following TAVR.  Symptomatically, clinically and by echo all showing just how well he is doing.  He is almost back to his baseline level of activity.  I rechecked a chest x-ray just to confirm no further pleural effusion noted.  EF on echo is back in the low normal range.  No significant valvular disease now.  Now regular with HFpEF which seems to be relatively stable NYHA class I-II symptoms.[place] Kitchen

## 2020-09-10 NOTE — Assessment & Plan Note (Signed)
Remarkable improvement following TAVR from EF of 20 to 25% up to 50 to 55%.  This is simply with adding a few medicines and TAVR.  We are now actually trying to treat his blood pressure for hyper as opposed to hypotension.  HFpEF with NYHA class I-II symptoms at this point.  Plan: Continue afterload reduction with irbesartan and amlodipine.  He is on carvedilol, but I am reluctant to titrate his dose much further with resting heart rate in the 60s. He is taking half a tablet to a full tablet of Lasix a few days a week, but on as-needed basis.

## 2020-09-10 NOTE — Assessment & Plan Note (Signed)
Cleared up on chest x-ray.  No further symptoms.

## 2020-10-25 DIAGNOSIS — Z23 Encounter for immunization: Secondary | ICD-10-CM | POA: Diagnosis not present

## 2020-12-05 IMAGING — CT CT HEART MORP W/ CTA COR W/ SCORE W/ CA W/CM &/OR W/O CM
1 series · 3 of 20 positions shown, 4 images · non-contrast
Comparison: No priors.
COMPARISON: No priors.

Addendum:
EXAM:
OVER-READ INTERPRETATION  CT CHEST

The following report is an over-read performed by radiologist Dr.
Eu Mcbride [REDACTED] on 02/23/2020. This
over-read does not include interpretation of cardiac or coronary
anatomy or pathology. The coronary calcium score/coronary CTA
interpretation by the cardiologist is attached.
CLINICAL DATA: 79-year-old male with severe aortic stenosis being
evaluated for a TAVR procedure vs AVR.
Cardiac TAVR CT
TECHNIQUE: The patient was scanned on a Phillips Force scanner. A 120 kV
retrospective scan was triggered in the descending thoracic aorta at
111 HU's. Gantry rotation speed was 250 msecs and collimation was .6
mm. Carvedilol 3.125 mg PO and no nitro were given. The 3D data set
was reconstructed in 5% intervals of the R-R cycle. Systolic and
diastolic phases were analyzed on a dedicated work station using
MPR, MIP and VRT modes. The patient received 80 cc of contrast.

[Series 602: (person_name) - tavr · 0.26mm/px · 3 of 27 slices shown, 4 images]
[im 6/27  vessel]
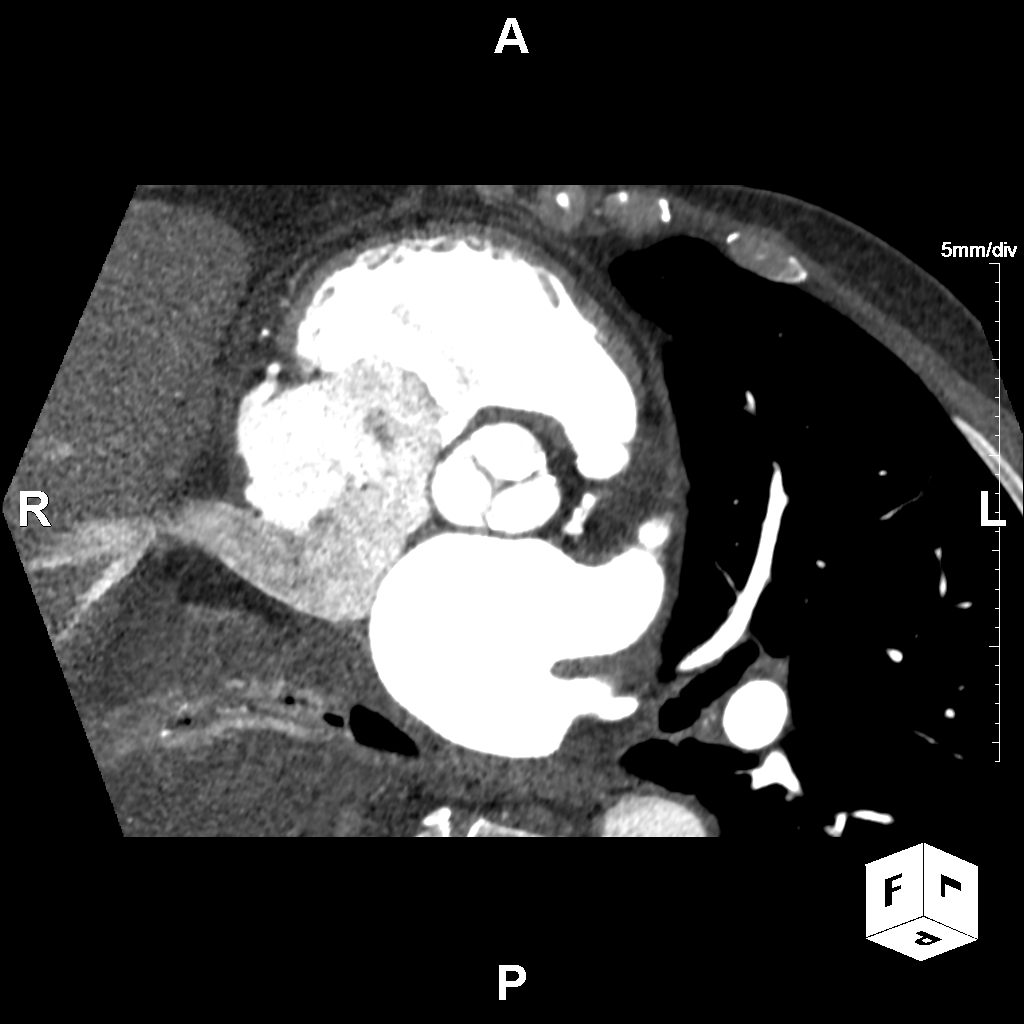
[im 6/27  lung]
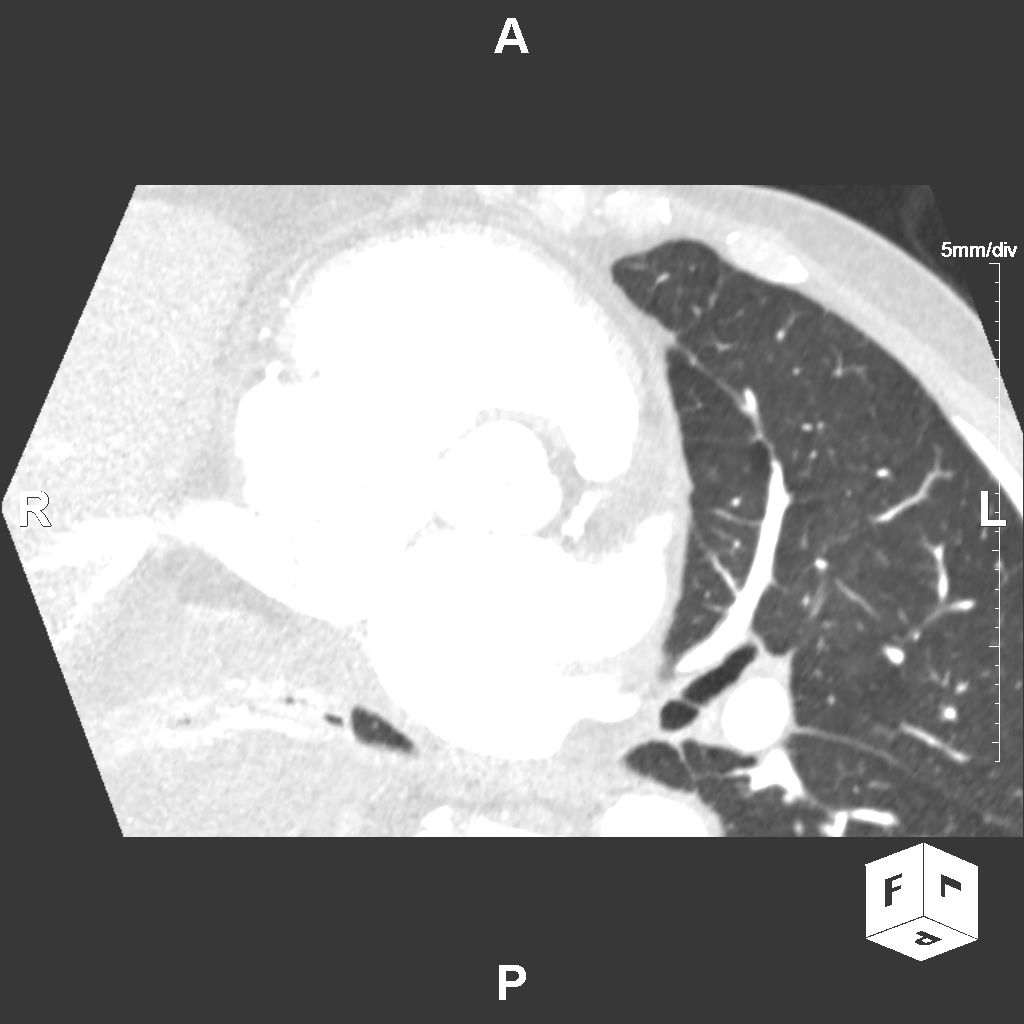
[im 14/27  vessel]
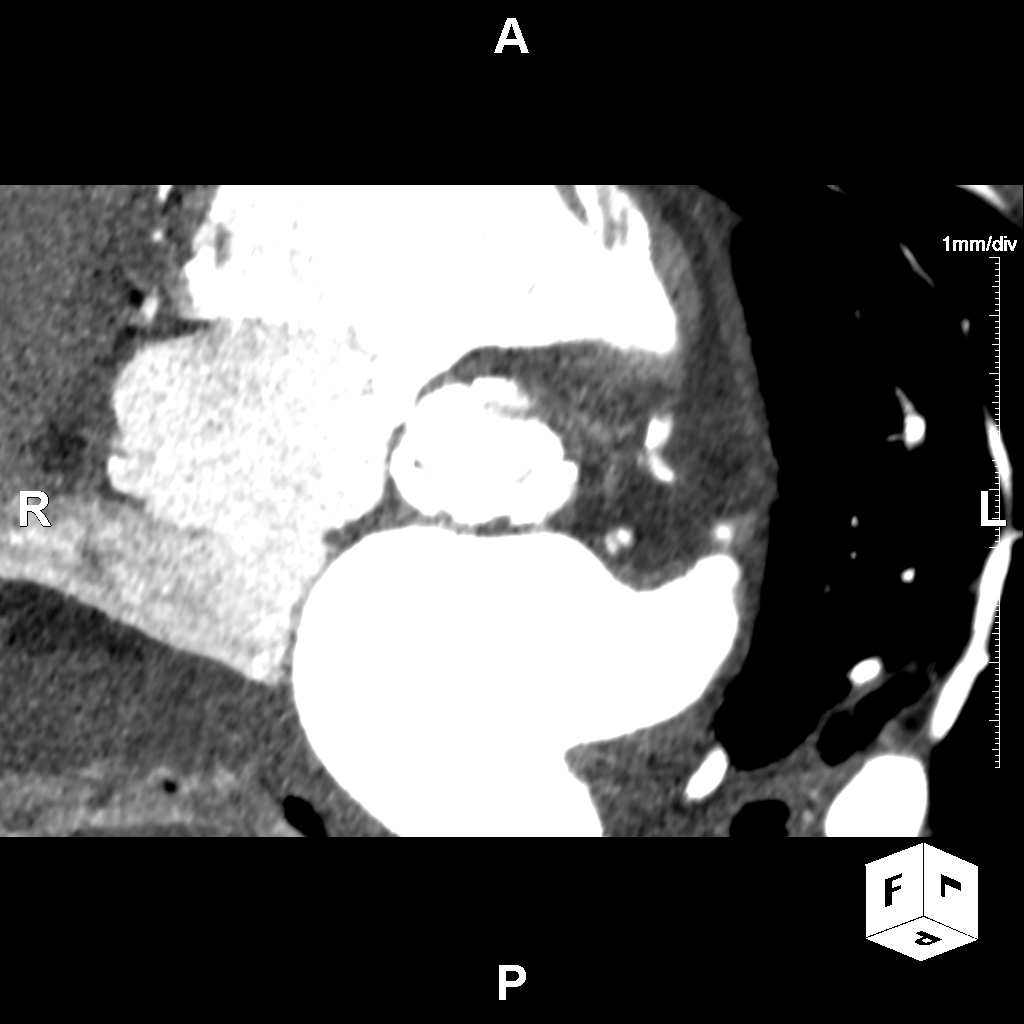
[im 22/27  vessel]
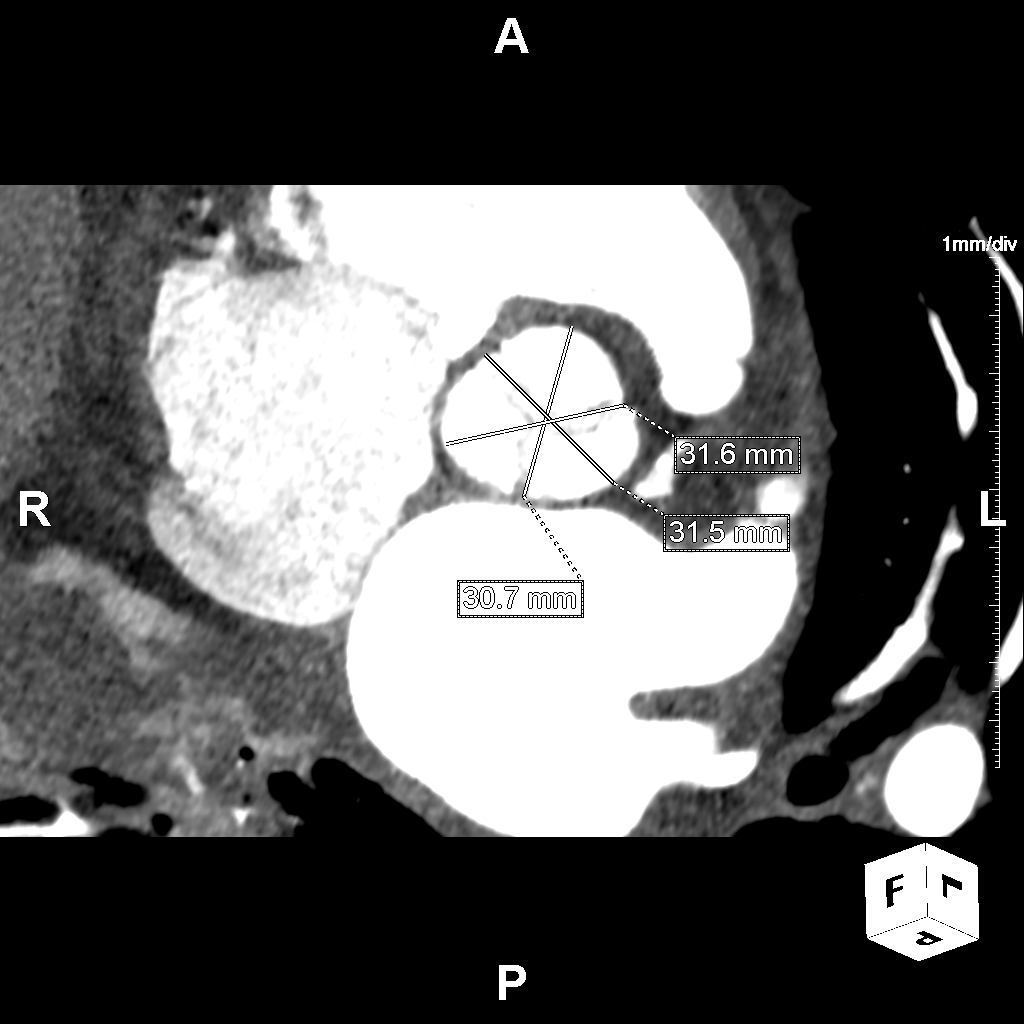

[3 of 20 positions shown; findings below may reference images not displayed]

FINDINGS: Extracardiac findings will be described separately under dictation
for contemporaneously obtained CTA chest, abdomen and pelvis.
IMPRESSION: Please see separate dictation for contemporaneously obtained CTA
chest, abdomen and pelvis dated 02/23/2020 for full description of
relevant extracardiac findings.
FINDINGS: Aortic Valve: Trileaflet aortic valve with severely calcified
leaflets and severely restricted leaflet opening but only minimal
calcifications extending into the LVOT. Aortic valve calcium score
4388 consistent with severe aortic stenosis.

Aorta: Normal size with mild diffuse atherosclerotic plaque and
calcifications. No dissection.

Sinotubular Junction: 29.0 x 27.9 mm

Ascending Thoracic Aorta: 36.6 x 36.6 mm

Aortic Arch: 23.6 x 23.1 mm

Descending Thoracic Aorta: 20.4 x 19.9 mm

Sinus of Valsalva Measurements:

Non-coronary: 32 mm

Right -coronary: 31 mm

Left -coronary: 32 mm

Coronary Artery Height above Annulus:

Left Main: 13 mm

Right Coronary: 19 mm

Virtual Basal Annulus Measurements:

Maximum/Minimum Diameter: 28.0 x 22.0 mm

Mean Diameter: 24.8 mm

Perimeter: 80.1 mm

Area: 483 mm2

Optimum Fluoroscopic Angle for Delivery: LAO 11 SALATIEL 14

Mildly dilated pulmonary artery measuring 31 mm.
IMPRESSION: 1. Trileaflet aortic valve with severely calcified leaflets and
severely restricted leaflet opening but only minimal calcifications
extending into the LVOT. Aortic valve calcium score 4388 consistent
with severe aortic stenosis. Annular measurements suitable for
delivery of a 26 mm Edwards-SAPIEN 3 valve.

2. Sufficient coronary to annulus distance.

3. Optimum Fluoroscopic Angle for Delivery:  LAO 11 SALATIEL 14.

4. No thrombus in the left atrial appendage.

*** End of Addendum ***
EXAM:
OVER-READ INTERPRETATION  CT CHEST

The following report is an over-read performed by radiologist Dr.
Eu Mcbride [REDACTED] on 02/23/2020. This
over-read does not include interpretation of cardiac or coronary
anatomy or pathology. The coronary calcium score/coronary CTA
interpretation by the cardiologist is attached.
FINDINGS: Extracardiac findings will be described separately under dictation
for contemporaneously obtained CTA chest, abdomen and pelvis.
IMPRESSION: Please see separate dictation for contemporaneously obtained CTA
chest, abdomen and pelvis dated 02/23/2020 for full description of
relevant extracardiac findings.

## 2020-12-21 IMAGING — CR DG CHEST 2V
2 series · 2 of 2 positions shown · non-contrast
Comparison: 02/23/2020

CLINICAL DATA: Preoperative exam.  Aortic stenosis.

EXAM:
CHEST - 2 VIEW

[w chest pa]
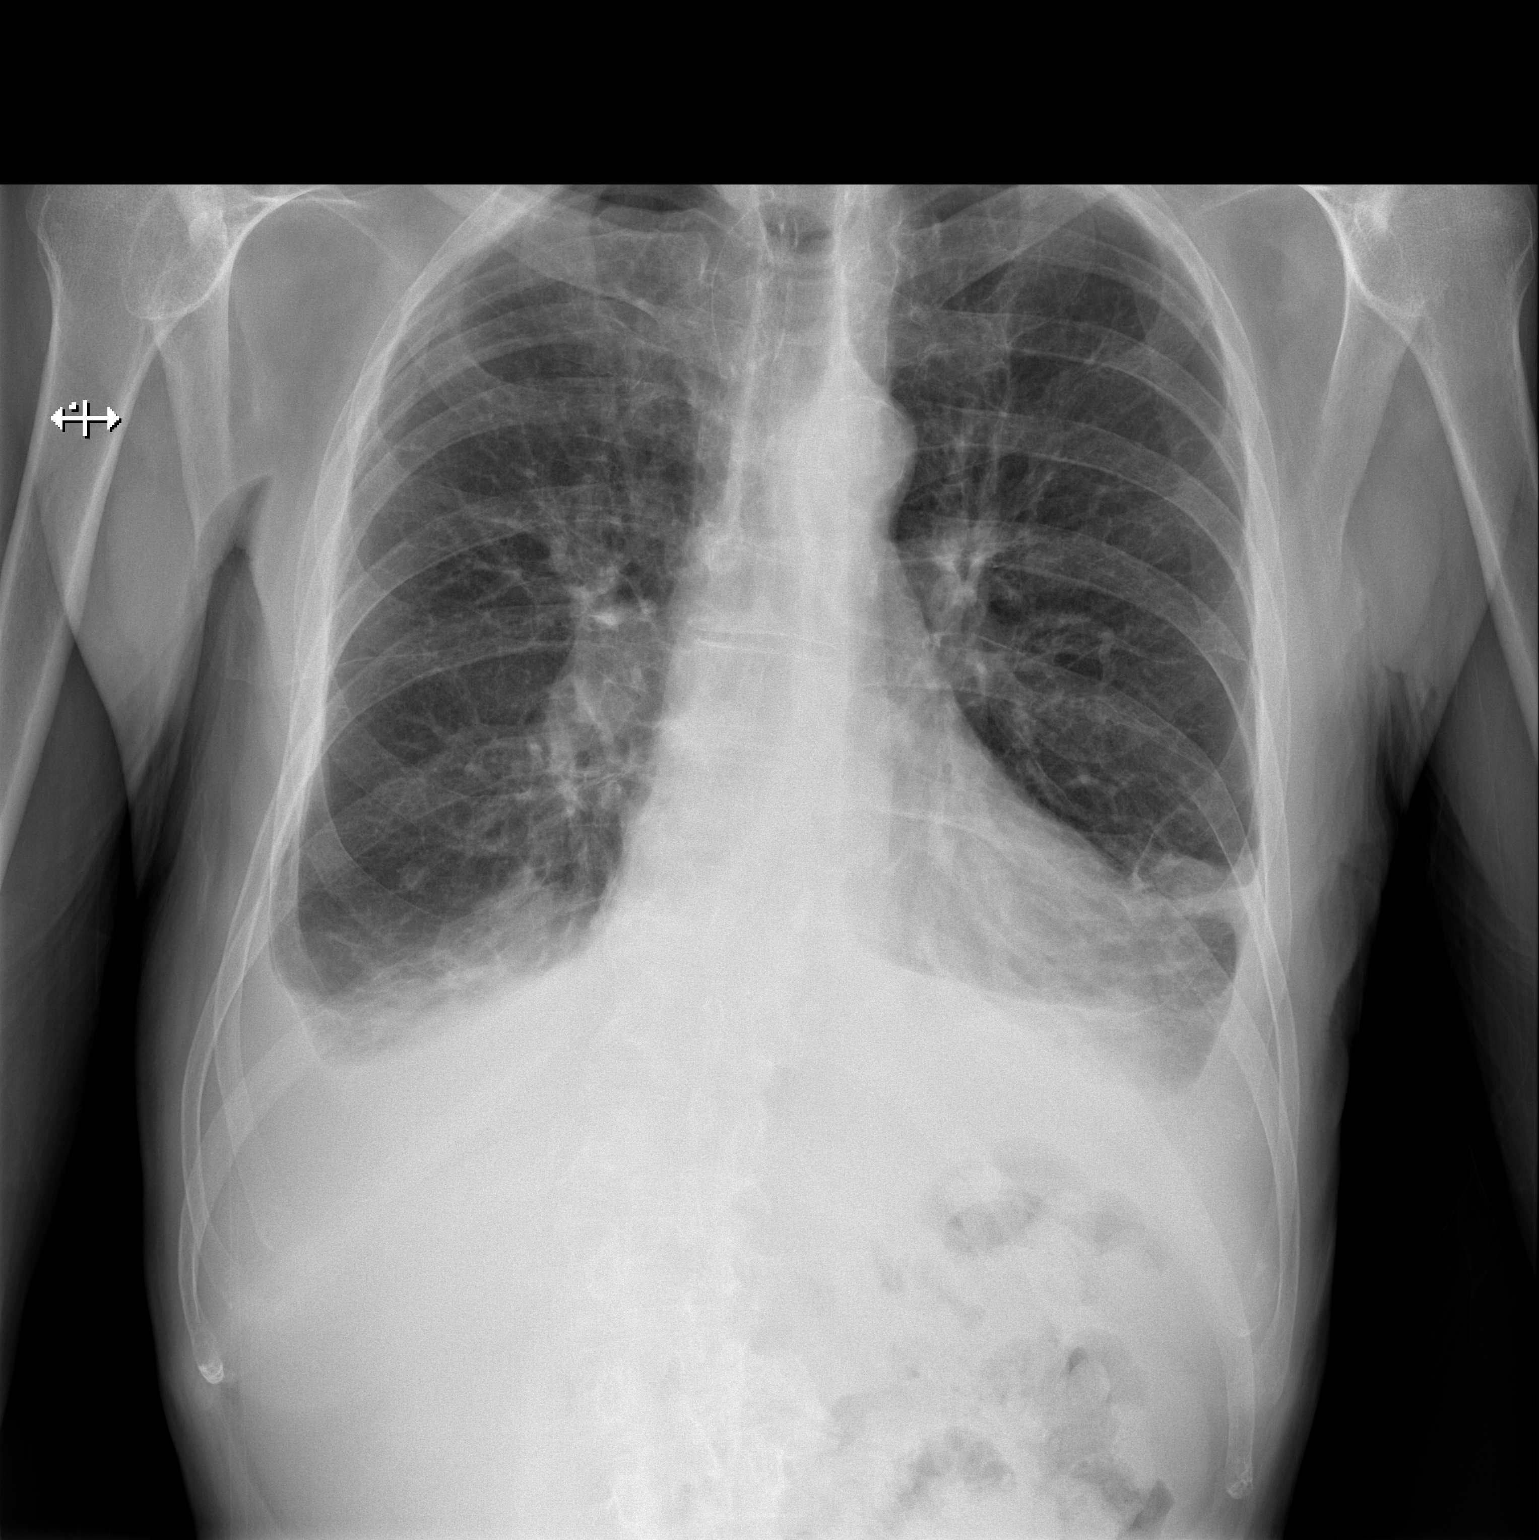

[w chest lat]
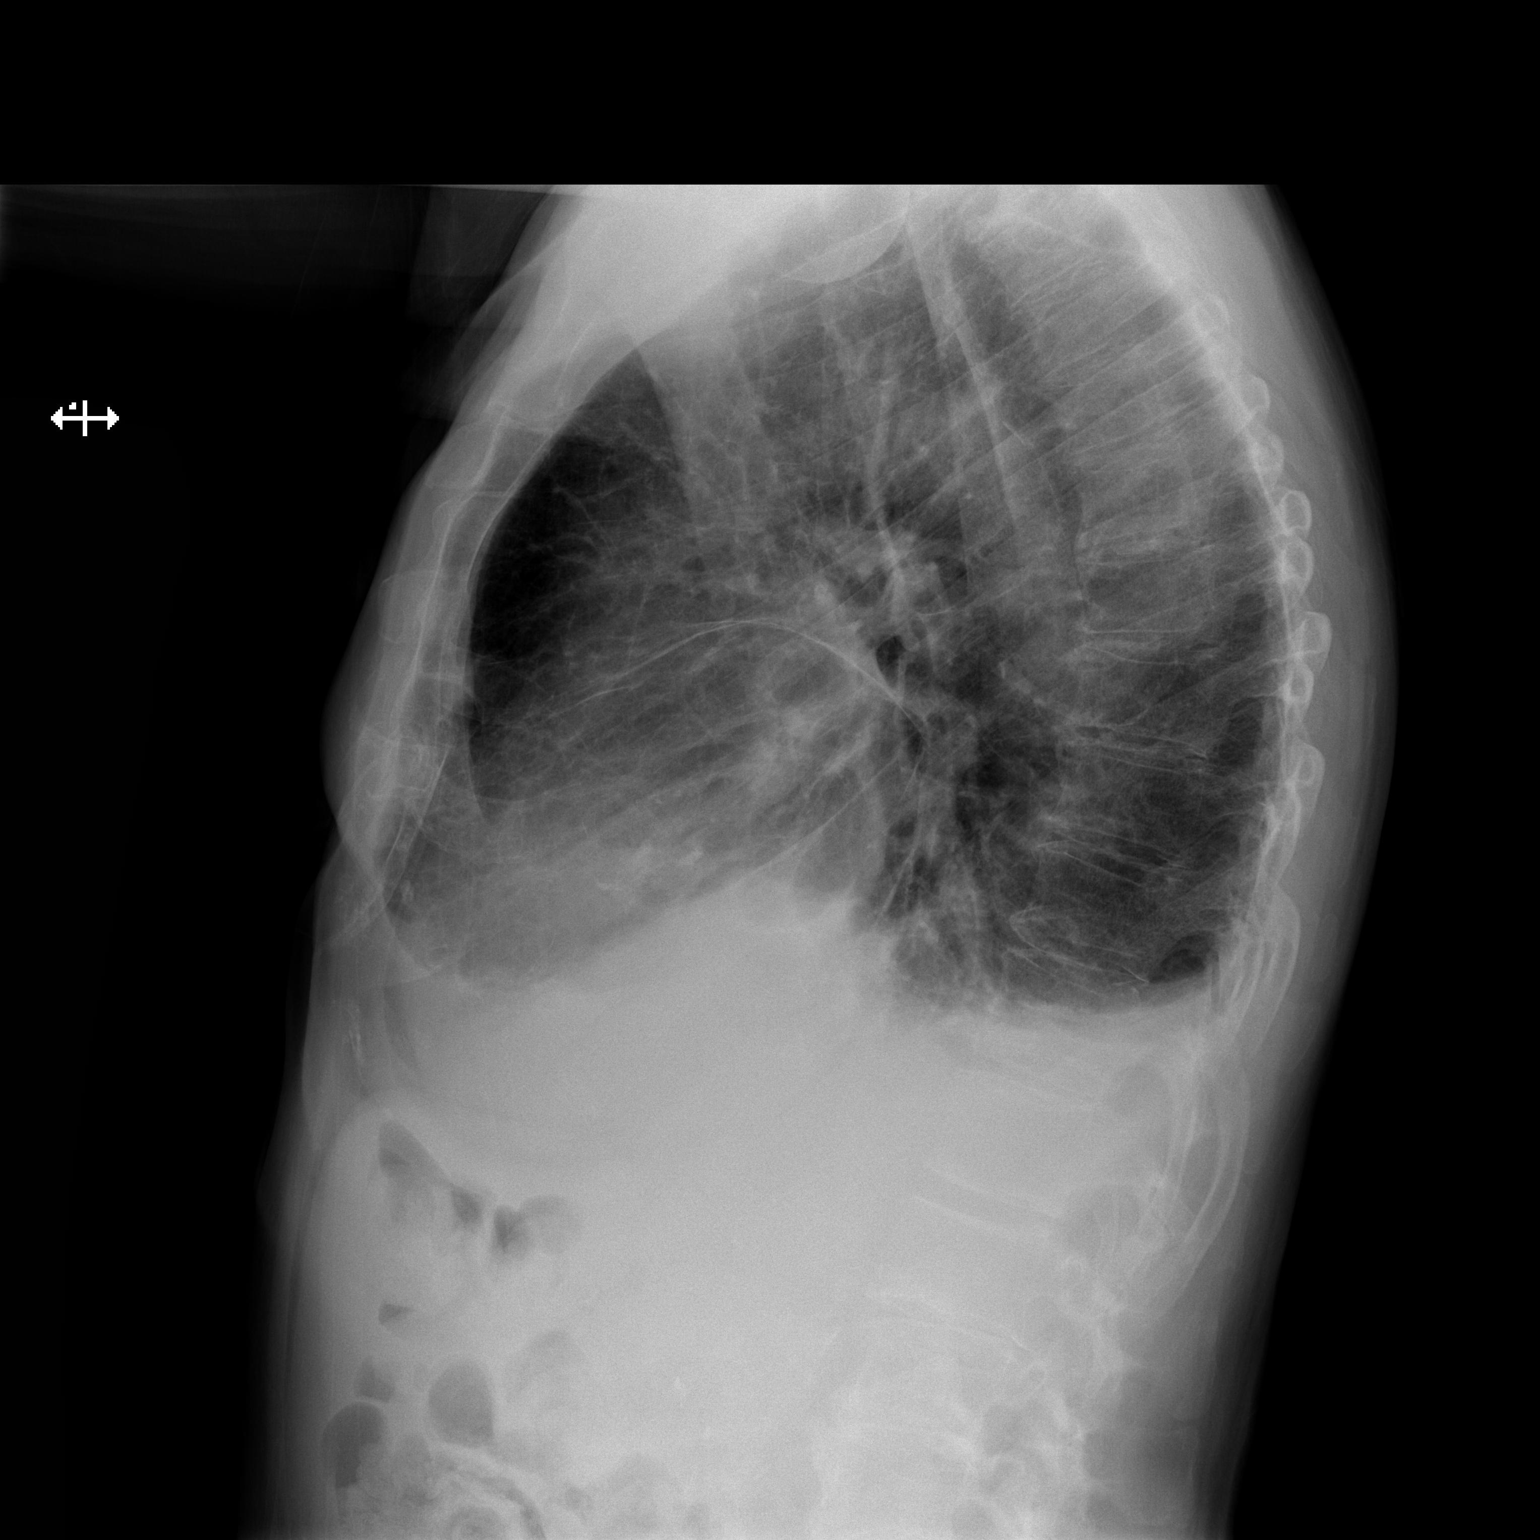

[2 of 2 positions shown; findings below may reference images not displayed]

FINDINGS: Mild cardiomegaly, stable. Atherosclerotic calcification of the
aortic knob. Moderate bilateral pleural effusions with associated
bibasilar atelectasis, not appreciably changed from prior. No
pneumothorax. No acute osseous findings.
IMPRESSION: Stable moderate bilateral pleural effusions with associated
bibasilar atelectasis.

## 2021-01-16 ENCOUNTER — Other Ambulatory Visit: Payer: Self-pay | Admitting: Physician Assistant

## 2021-01-16 DIAGNOSIS — Z952 Presence of prosthetic heart valve: Secondary | ICD-10-CM

## 2021-01-19 DIAGNOSIS — Z125 Encounter for screening for malignant neoplasm of prostate: Secondary | ICD-10-CM | POA: Diagnosis not present

## 2021-01-19 DIAGNOSIS — E785 Hyperlipidemia, unspecified: Secondary | ICD-10-CM | POA: Diagnosis not present

## 2021-01-30 DIAGNOSIS — Z1331 Encounter for screening for depression: Secondary | ICD-10-CM | POA: Diagnosis not present

## 2021-01-30 DIAGNOSIS — I110 Hypertensive heart disease with heart failure: Secondary | ICD-10-CM | POA: Diagnosis not present

## 2021-01-30 DIAGNOSIS — Z1389 Encounter for screening for other disorder: Secondary | ICD-10-CM | POA: Diagnosis not present

## 2021-01-30 DIAGNOSIS — I700 Atherosclerosis of aorta: Secondary | ICD-10-CM | POA: Diagnosis not present

## 2021-01-30 DIAGNOSIS — Z0000 Encounter for general adult medical examination without abnormal findings: Secondary | ICD-10-CM | POA: Diagnosis not present

## 2021-01-30 DIAGNOSIS — I428 Other cardiomyopathies: Secondary | ICD-10-CM | POA: Diagnosis not present

## 2021-01-30 DIAGNOSIS — E785 Hyperlipidemia, unspecified: Secondary | ICD-10-CM | POA: Diagnosis not present

## 2021-01-30 DIAGNOSIS — Z952 Presence of prosthetic heart valve: Secondary | ICD-10-CM | POA: Diagnosis not present

## 2021-03-06 ENCOUNTER — Other Ambulatory Visit: Payer: Self-pay

## 2021-03-06 ENCOUNTER — Ambulatory Visit (HOSPITAL_COMMUNITY): Payer: Medicare Other | Attending: Internal Medicine

## 2021-03-06 DIAGNOSIS — Z952 Presence of prosthetic heart valve: Secondary | ICD-10-CM | POA: Insufficient documentation

## 2021-03-06 HISTORY — PX: TRANSTHORACIC ECHOCARDIOGRAM: SHX275

## 2021-03-06 LAB — ECHOCARDIOGRAM COMPLETE
AR max vel: 1.18 cm2
AV Area VTI: 1.32 cm2
AV Area mean vel: 1.12 cm2
AV Mean grad: 12 mmHg
AV Peak grad: 20.8 mmHg
Ao pk vel: 2.28 m/s
Area-P 1/2: 2.26 cm2
MV M vel: 6.57 m/s
MV Peak grad: 172.7 mmHg
Radius: 0.4 cm
S' Lateral: 2.8 cm

## 2021-03-12 ENCOUNTER — Other Ambulatory Visit: Payer: Self-pay

## 2021-03-12 ENCOUNTER — Ambulatory Visit (INDEPENDENT_AMBULATORY_CARE_PROVIDER_SITE_OTHER): Payer: Medicare Other | Admitting: Cardiology

## 2021-03-12 ENCOUNTER — Encounter: Payer: Self-pay | Admitting: Cardiology

## 2021-03-12 VITALS — BP 120/70 | HR 56 | Ht 69.0 in | Wt 142.0 lb

## 2021-03-12 DIAGNOSIS — I10 Essential (primary) hypertension: Secondary | ICD-10-CM

## 2021-03-12 DIAGNOSIS — E7849 Other hyperlipidemia: Secondary | ICD-10-CM

## 2021-03-12 DIAGNOSIS — Z952 Presence of prosthetic heart valve: Secondary | ICD-10-CM

## 2021-03-12 DIAGNOSIS — I428 Other cardiomyopathies: Secondary | ICD-10-CM

## 2021-03-12 NOTE — Progress Notes (Signed)
Primary Care Provider: Michael Boston, MD Cardiologist: Glenetta Hew, MD Electrophysiologist: None  Clinic Note: Chief Complaint  Patient presents with   Follow-up    6 months.   ===================================  ASSESSMENT/PLAN   Problem List Items Addressed This Visit       Cardiology Problems   Valvular cardiomyopathy (HCC)-RESOLVED - Primary (Chronic)    Echo results reviewed with patient and discussed.   Essentially now resolved 1 year out from TAVR.  EF is now way back up to 55 to 60% on minimal medications.  NYHA class I symptoms with minimal HFpEF-GR 1 DD on echo.  Continue low-dose carvedilol along with irbesartan and amlodipine.. He has PRN Lasix 40 mg which he is taking maybe once or twice in the last 2 months.  Continue to refill      Relevant Orders   EKG 12-Lead (Completed)   Essential hypertension (Chronic)    He keeps his blood pressure checks on a regular basis recorded on [employer].  Most recent levels reveal excellent blood pressures on current medications.  Tolerating low-dose carvedilol along with high-dose amlodipine and irbesartan.  PRN diuretic.      Relevant Orders   EKG 12-Lead (Completed)   HLD (hyperlipidemia) (Chronic)    Labs are being followed by PCP.  We will try get labs to review for next visit, but he would prefer to avoid medications.  Target LDL should be at least less than 100.      Relevant Orders   EKG 12-Lead (Completed)     Other   S/P TAVR (transcatheter aortic valve replacement) (Chronic)    1 year out post TAVR with mean gradient of 12 mmHg.  Only mild to moderate MR.  EF notably improved from 20-25% up to565 to 60% with only GR 1 DD.  Valve functioning well with no significant PVL. NYHA Class I CHF symptoms  Plan: Continue aspirin-he had asked about need to stay on it, and I think the recommendation would be lifelong aspirin.  Okay to hold for procedures 5 days.      Relevant Orders   EKG 12-Lead (Completed)    ===================================  HPI:    Cesar Bullock is a 80 y.o. male with a PMH Notable for resolved VALVULAR CARDIOMYOPATHY after TAVR for SEVERE AS (EF improved from 20 to 25% to 55 to 60 % within 1 year post TAVR)who presents today for 69-monthfollow-up.  EOris StaffieriCumins was last seen on 08/17/2020 for roughly 376-monthollow-up, feeling much better.  He really was able to recall that he had been noticing chest tightness and dyspnea there was more prominent than he realized.  Swelling was now controlled by just taking rare doses of as needed Lasix.  His blood pressures were well controlled and his weights are stable at about 139 pounds at home (which equates to about 142 lb in the clinic.  Was thoroughly enjoying his rehab, and enjoying having a "new lease on life ".  Recent Hospitalizations: None  Reviewed  CV studies:    The following studies were reviewed today: (if available, images/films reviewed: From Epic Chart or Care Everywhere) ECHO 03/06/2021: No significant change from prior study. 04/12/2020: LVEF 55-60%, TAVR with 12 mmHg mean gradient, mild to moderate MR. GR 1 DD.   Interval History:   Cesar Bullock returns here today stating that he is doing very well.  He says he is active all day, enjoys walking the dogs and intermittently goes for walks but  no routine exercise now.  He does yard work now and gardening.  He has trivial edema and maybe uses his Lasix much twice a month.  He denies any chest pain or pressure at rest exertion, no PND, orthopnea or worsening edema..No rapid regular heartbeats palpitations.  No syncope or near syncope, TIA/amaurosis fugax or claudication.  Blood pressure levels have been well controlled as submitted-average blood pressure 110-115 mmHg mercury systolic.   REVIEWED OF SYSTEMS   Review of Systems  Constitutional:  Negative for chills, fever, malaise/fatigue and weight loss.  HENT:  Negative for congestion and nosebleeds.    Respiratory:  Negative for cough and wheezing.   Cardiovascular:  Negative for claudication (His legs bother him a little bit when he is walking, but do not limit him at all.).  Gastrointestinal:  Negative for abdominal pain and blood in stool.  Genitourinary:  Negative for dysuria and hematuria.  Musculoskeletal:  Negative for joint pain.  Neurological:  Negative for dizziness (Only if he gets that way too fast), focal weakness, weakness and headaches.  Endo/Heme/Allergies:  Negative for environmental allergies.  Psychiatric/Behavioral:  Negative for depression and memory loss. The patient is not nervous/anxious and does not have insomnia.    I have reviewed and (if needed) personally updated the patient's problem list, medications, allergies, past medical and surgical history, social and family history.   PAST MEDICAL HISTORY   Past Medical History:  Diagnosis Date   2019 novel coronavirus disease (COVID-19) 08/12/2019   -Follow-up test on August 25, 2019 not detected.  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 => Echo EF 20-25%, critical AS. --> initially diuresed, then referred for TAVR -> EF up to 50-55% on 2nd Post-TAVR Echo!!!.  Grr II DD. -- NOW mostly HFpEF (EF 55-60% as of 03/2021!!!   RESOLVED: Valvular cardiomyopathy ([place]) 01/2020   EF 20-25% with Critical/Severe AS --> COMPLETELY RESOLVED -- 03/2021 - EF 55-60%, Gr 1 DD. !!!!   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 Angelena Form and Dr. Cyndia Bent    Sensorineural hearing loss, bilateral 2016   Strict hearing loss precautions   Severe aortic stenosis     PAST SURGICAL HISTORY   Past Surgical History:  Procedure Laterality Date   14d ZIO PATCH - CARDIAC EVENT MONITOR  03/2020   s/p TAVR: Predom: SR - min HR 49 bpm, Max 113 bpm, Avg 64 bpm. LBBB and 1  AVB.  Rare isolated PACs and PVCs.  PAC couplets and triplets noted.  For short runs of SVT 4-6 beats: Longest was 6 beats at 99 bpm, fastest was 5 beats at 135 bpm).  No evidence of CHB or HAVB.   BUBBLE STUDY  02/16/2020   Procedure: BUBBLE STUDY;  Surgeon: Elouise Munroe, MD;  Location: [facility];  Service: Cardiology;;  Cataract surgery  2018   Dr. Calvert Cantor   EYE SURGERY Bilateral    cataract   IR THORACENTESIS ASP PLEURAL SPACE W/IMG GUIDE  03/15/2020   Right side Pl. Effusion   RIGHT/LEFT HEART CATH AND CORONARY ANGIOGRAPHY N/A 02/16/2020   Procedure: RIGHT/LEFT HEART CATH AND CORONARY ANGIOGRAPHY;  Surgeon: Leonie Man, MD;  Location: [facility] INVASIVE CV LAB;; PRE-TAVR: Angiograpnhically NORMAL CORONARIES: Severe Valvular CM (EF ~25%, CO/CI 3.25 /1.82) Mod-Severe Pulm HTN (PAP-mean: 67/25 mmHg - 41 mmHg; PCPWP 27 mHg.  AoP 136/74 mmHg - MAP 99 mmHg. RVP-EDP: 67/4 mmHg - 9 mmHg, RAP 6 mmHg - mod RA dilation)   TEE WITHOUT CARDIOVERSION N/A 02/16/2020   Procedure: TRANSESOPHAGEAL ECHOCARDIOGRAM (TEE);  Surgeon: Elouise Munroe, MD;  Location: [facility];  Service: Cardiology;  Laterality: N/A;   TEE WITHOUT CARDIOVERSION N/A 03/14/2020   Procedure: TRANSESOPHAGEAL ECHOCARDIOGRAM (TEE) - INRA-OP TAVR;  Surgeon: Ena Dawley, MD;  Location: [facility] CV LAB;  (Large left pleural effusion -> thoracentesis of 1.1 L); TransAoV gradients improved from 65/40 mmHg to 9/6 mmHg post TAVR deployment. --> EF improved from 25-30% to  40-45%. Mod LA dilation & mild RA dilation. Mild-mod MR; Large L Lateral pleural effusion.    TONSILLECTOMY     age 74   TRANSCATHETER AORTIC VALVE REPLACEMENT, TRANSFEMORAL N/A 03/14/2020   Procedure: TRANSCATHETER AORTIC VALVE REPLACEMENT, TRANSFEMORAL;  Surgeon: Burnell Blanks, MD;  Location: [facility] INVASIVE CV LAB;  26 mm Edwards Sapien 3U THV - via TFA approach. AoP 101/47 mmHg (MAP 68 mmHg) - LVP-EDP 180/10 mmHg - 21 mmHg.   TRANSTHORACIC ECHOCARDIOGRAM  03/2017   [facility]): Mild LVH with normal LV contraction (LVEF 50%). Severely calcified aortic valve with mild regurgitation and moderate-severe stenosis (AoV velocity 4.4 m/s, AVA 0.6 cm^2). Mitral calcification with mild MR. Mild pulmonary hypertension. Normal RV size and function.   TRANSTHORACIC ECHOCARDIOGRAM  07/19/2015    Normal LV size with mild LVH. LVEF 55-60% with grade 2 diastolic dysfunction. Moderate AS and mild AI. MAC with mild MR. Mild LA enlargement. Normal RV size and function.   TRANSTHORACIC ECHOCARDIOGRAM  02/11/2020   EF 25-30% with severely reduced function global).  Moderately elevated PA pressures (~53 mmHg).  Severe AS with mild to moderate AI (AVA by VTI ~0.49 cm, mean gradient 41 mmHg. V max 3.76ms; AI py PHT 465 msec)   TRANSTHORACIC ECHOCARDIOGRAM  03/15/2020   A) POD #1 - s/p TAVR: Normal TAVR valve.  Mean gradient 11 mmHg.  No PVL.  EF improved to 40 to 45% with global HK.  Moderate concentric LVH.  GRII DD.  Large left pleural effusion with atelectasis.  Severe LA dilation.  Mild RA dilation.  Mild MR.;; B) 1 month s/p TAVR: EF 50 to 55%.  GRII DD.  Moderate LA dilation.  Normal PA pressures.  Normal functioning TAVR valve.  No PVL.  No sig stenosis.   TRANSTHORACIC ECHOCARDIOGRAM  03/06/2021   No significant change from prior study. 04/12/2020: LVEF 55-60%, TAVR with 12 mmHg mean gradient, mild to moderate MR. GR 1 DD.    Immunization History  Administered Date(s) Administered    PFIZER(Purple Top)SARS-COV-2 Vaccination 08/27/2019, 09/21/2019    MEDICATIONS/ALLERGIES   Current Meds  Medication Sig   amLODipine (NORVASC) 10 MG tablet Take 1 tablet (10 mg total) by mouth daily.   aspirin EC 81 MG tablet Take 81 mg by mouth daily. Swallow whole.   carvedilol (COREG) 3.125 MG tablet Take 1  tablet (3.125 mg total) by mouth 2 (two) times daily.   furosemide (LASIX) 40 MG tablet Take 1 tablet (40 mg total) by mouth as needed. For tightness in your chest or worsening or shortness of breath   irbesartan (AVAPRO) 300 MG tablet Take 1 tablet (300 mg total) by mouth daily.    No Known Allergies  SOCIAL HISTORY/FAMILY HISTORY   Reviewed in Epic:  Pertinent findings:  Social History   Tobacco Use   Smoking status: Never   Smokeless tobacco: Never  Vaping Use   Vaping Use: Never used  Substance Use Topics   Alcohol use: Not Currently    Alcohol/week: 6.0 standard drinks    Types: 6 Shots of liquor per week    Comment: vodka when he stopped drinking roughly 32moago 05/30/2020   Drug use: No   Social History   Social History Narrative   Lives by himself   6 g-kids      Former [employer]      Exercise-minimal   Quit drinking alcohol in March 2021    OBJCTIVE -PE, EKG, labs   Wt Readings from Last 3 Encounters:  03/12/21 142 lb (64.4 kg)  08/17/20 148 lb 11.2 oz (67.4 kg)  06/06/20 138 lb (62.6 kg)    Physical Exam: BP 120/70 (BP Location: Left Arm, Patient Position: Sitting, Cuff Size: Normal)   Pulse (!) 56   Ht _0  (1.753 m)   Wt 142 lb (64.4 kg)   SpO2 97%   BMI 20.97 kg/m  Physical Exam Vitals reviewed.  Constitutional:      General: He is not in acute distress.    Appearance: Normal appearance. He is normal weight. He is not toxic-appearing.     Comments: Well appearing.  Well-nourished and well-groomed.  HENT:     Head: Normocephalic and atraumatic.  Neck:     Vascular: No carotid bruit, hepatojugular reflux or JVD.   Cardiovascular:     Rate and Rhythm: Normal rate and regular rhythm. Occasional Extrasystoles are present.    Chest Wall: PMI is not displaced.     Pulses: Normal pulses.     Heart sounds: Murmur (1/6 C-D SEM @ RUSB - neck) heard.    No friction rub. No gallop.     Comments: Normal S1 with ~ split S2.  Pulmonary:     Effort: Pulmonary effort is normal. No respiratory distress.     Breath sounds: Normal breath sounds. No wheezing, rhonchi or rales.  Chest:     Chest wall: No tenderness.  Musculoskeletal:        General: No swelling. Normal range of motion.     Cervical back: Normal range of motion.  Skin:    General: Skin is warm and dry.  Neurological:     General: No focal deficit present.     Mental Status: He is alert and oriented to person, place, and time.     Motor: No weakness.     Gait: Gait normal.  Psychiatric:        Mood and Affect: Mood normal.        Behavior: Behavior normal.        Thought Content: Thought content normal.        Judgment: Judgment normal.    Adult ECG Report  Rate: 56 ;  Rhythm: normal sinus rhythm and 1  AVB, left axis deviation with complete LBBB (had been IVCD) ;   Narrative Interpretation: Now completed LBBB.  Recent Labs: Labs checked by PCP, not available. Lab Results  Component Value Date   CHOL 232 (H) 06/12/2015   HDL 63.70 06/12/2015   LDLCALC 148 (H) 06/12/2015   LDLDIRECT 146.5 11/09/2012   TRIG 99.0 06/12/2015   CHOLHDL 4 06/12/2015   Lab Results  Component Value Date   CREATININE 0.97 08/01/2020   BUN 22 08/01/2020   NA 142 08/01/2020   K 4.6 08/01/2020   CL 105 08/01/2020   CO2 25 08/01/2020   CBC Latest Ref Rng & Units 03/15/2020 03/14/2020 03/14/2020  WBC 4.0 - 10.5 K/uL 7.5 - -  Hemoglobin 13.0 - 17.0 g/dL 10.4(L) 10.9(L) 10.5(L)  Hematocrit 39.0 - 52.0 % 33.0(L) 32.0(L) 31.0(L)  Platelets 150 - 400 K/uL 134(L) - -    Lab Results  Component Value Date   HGBA1C 6.0 (H) 03/10/2020   Lab Results  Component  Value Date   TSH 2.67 06/12/2015    ==================================================  COVID-19 Education: The signs and symptoms of COVID-19 were discussed with the patient and how to seek care for testing (follow up with PCP or arrange E-visit).    I spent a total of 28 minutes with the patient spent in direct patient consultation.  Additional time spent with chart review  / charting (studies, outside notes, etc): 16 min Total Time: 44 min  Current medicines are reviewed at length with the patient today.  (+/- concerns) None  This visit occurred during the SARS-CoV-2 public health emergency.  Safety protocols were in place, including screening questions prior to the visit, additional usage of staff PPE, and extensive cleaning of exam room while observing appropriate contact time as indicated for disinfecting solutions.  Notice: This dictation was prepared with Dragon dictation along with smaller phrase technology. Any transcriptional errors that result from this process are unintentional and may not be corrected upon review.  Patient Instructions / Medication Changes & Studies & Tests Ordered   Patient Instructions  Medication Instructions:  Your physician recommends that you continue on your current medications as directed. Please refer to the Current Medication list given to you today. *If you need a refill on your cardiac medications before your next appointment, please call your pharmacy*   Lab Work: None  If you have labs (blood work) drawn today and your tests are completely normal, you will receive your results only by: [place] (if you have MyChart) OR A paper copy in the mail If you have any lab test that is abnormal or we need to change your treatment, we will call you to review the results.   Testing/Procedures: None    Follow-Up: At [facility], you and your health needs are our priority.  As part of our continuing mission to provide you with  exceptional heart care, we have created designated Provider Care Teams.  These Care Teams include your primary Cardiologist (physician) and Advanced Practice Providers (APPs -  Physician Assistants and Nurse Practitioners) who all work together to provide you with the care you need, when you need it.  We recommend signing up for the patient portal called "MyChart".  Sign up information is provided on this After Visit Summary.  MyChart is used to connect with patients for Virtual Visits (Telemedicine).  Patients are able to view lab/test results, encounter notes, upcoming appointments, etc.  Non-urgent messages can be sent to your provider as well.   To learn more about what you can do with MyChart, go to [email or web address].    Your next appointment:  6 month(s)  The format for your next appointment:   In Person  Provider:   Glenetta Hew, MD   Other Instructions none   Studies Ordered:   Orders Placed This Encounter  Procedures   EKG 12-Lead      Glenetta Hew, M.D., M.S. Interventional Cardiologist   Pager # [phone] Phone # [phone] [address] [place], [place] [address]   Thank you for choosing Heartcare at [facility]!!

## 2021-03-12 NOTE — Patient Instructions (Signed)
Medication Instructions:  Your physician recommends that you continue on your current medications as directed. Please refer to the Current Medication list given to you today. *If you need a refill on your cardiac medications before your next appointment, please call your pharmacy*   Lab Work: None  If you have labs (blood work) drawn today and your tests are completely normal, you will receive your results only by: [place] (if you have MyChart) OR A paper copy in the mail If you have any lab test that is abnormal or we need to change your treatment, we will call you to review the results.   Testing/Procedures: None    Follow-Up: At [facility], you and your health needs are our priority.  As part of our continuing mission to provide you with exceptional heart care, we have created designated Provider Care Teams.  These Care Teams include your primary Cardiologist (physician) and Advanced Practice Providers (APPs -  Physician Assistants and Nurse Practitioners) who all work together to provide you with the care you need, when you need it.  We recommend signing up for the patient portal called "MyChart".  Sign up information is provided on this After Visit Summary.  MyChart is used to connect with patients for Virtual Visits (Telemedicine).  Patients are able to view lab/test results, encounter notes, upcoming appointments, etc.  Non-urgent messages can be sent to your provider as well.   To learn more about what you can do with MyChart, go to [email or web address].    Your next appointment:   6 month(s)  The format for your next appointment:   In Person  Provider:   Glenetta Hew, MD   Other Instructions none

## 2021-04-01 ENCOUNTER — Encounter: Payer: Self-pay | Admitting: Cardiology

## 2021-04-01 NOTE — Assessment & Plan Note (Signed)
He keeps his blood pressure checks on a regular basis recorded on [employer].  Most recent levels reveal excellent blood pressures on current medications.  Tolerating low-dose carvedilol along with high-dose amlodipine and irbesartan.  PRN diuretic.

## 2021-04-01 NOTE — Assessment & Plan Note (Signed)
1 year out post TAVR with mean gradient of 12 mmHg.  Only mild to moderate MR.  EF notably improved from 20-25% up to565 to 60% with only GR 1 DD.  Valve functioning well with no significant PVL. NYHA Class I CHF symptoms  Plan: Continue aspirin-he had asked about need to stay on it, and I think the recommendation would be lifelong aspirin.  Okay to hold for procedures 5 days.

## 2021-04-01 NOTE — Assessment & Plan Note (Addendum)
Echo results reviewed with patient and discussed.   Essentially now resolved 1 year out from TAVR.  EF is now way back up to 55 to 60% on minimal medications.  NYHA class I symptoms with minimal HFpEF-GR 1 DD on echo.  Continue low-dose carvedilol along with irbesartan and amlodipine.. He has PRN Lasix 40 mg which he is taking maybe once or twice in the last 2 months.  Continue to refill

## 2021-04-01 NOTE — Assessment & Plan Note (Signed)
Labs are being followed by PCP.  We will try get labs to review for next visit, but he would prefer to avoid medications.  Target LDL should be at least less than 100.

## 2021-04-25 DIAGNOSIS — Z23 Encounter for immunization: Secondary | ICD-10-CM | POA: Diagnosis not present

## 2021-06-03 ENCOUNTER — Other Ambulatory Visit: Payer: Self-pay | Admitting: Cardiology

## 2021-08-12 ENCOUNTER — Encounter: Payer: Self-pay | Admitting: Cardiology

## 2021-08-12 ENCOUNTER — Other Ambulatory Visit: Payer: Self-pay | Admitting: Cardiology

## 2021-08-13 MED ORDER — AMLODIPINE BESYLATE 10 MG PO TABS: 10.0000 mg | ORAL_TABLET | Freq: Every day | ORAL | 0 refills | Status: DC

## 2021-08-14 DIAGNOSIS — L111 Transient acantholytic dermatosis [Grover]: Secondary | ICD-10-CM | POA: Diagnosis not present

## 2021-08-14 DIAGNOSIS — D225 Melanocytic nevi of trunk: Secondary | ICD-10-CM | POA: Diagnosis not present

## 2021-08-14 DIAGNOSIS — D0461 Carcinoma in situ of skin of right upper limb, including shoulder: Secondary | ICD-10-CM | POA: Diagnosis not present

## 2021-08-14 DIAGNOSIS — D485 Neoplasm of uncertain behavior of skin: Secondary | ICD-10-CM | POA: Diagnosis not present

## 2021-08-14 DIAGNOSIS — C44519 Basal cell carcinoma of skin of other part of trunk: Secondary | ICD-10-CM | POA: Diagnosis not present

## 2021-08-14 NOTE — Telephone Encounter (Signed)
Multiple blood pressure log recordings reviewed.  Outstanding average blood pressures ranging from 105/68 to 135/70 with averages in the 116-118/65.  Great numbers.  As for the ringing in the ear, I am not sure about the supplement.  We will ask my pharmacy team to review to see if they have heard of this supplement, and determine if any medications can be causing the ringing.  I know sometimes aspirin can cause some ringing.   Glenetta Hew, MD

## 2021-08-28 DIAGNOSIS — D485 Neoplasm of uncertain behavior of skin: Secondary | ICD-10-CM | POA: Diagnosis not present

## 2021-08-28 DIAGNOSIS — C44519 Basal cell carcinoma of skin of other part of trunk: Secondary | ICD-10-CM | POA: Diagnosis not present

## 2021-08-28 DIAGNOSIS — Z85828 Personal history of other malignant neoplasm of skin: Secondary | ICD-10-CM | POA: Diagnosis not present

## 2021-08-28 DIAGNOSIS — D225 Melanocytic nevi of trunk: Secondary | ICD-10-CM | POA: Diagnosis not present

## 2021-08-31 ENCOUNTER — Telehealth: Payer: Self-pay | Admitting: *Deleted

## 2021-08-31 NOTE — Telephone Encounter (Signed)
Called and asked patient to change appointment to September 10, 2021 at 3:40 pm. ? ? Patient states he would change appointment time. ?

## 2021-09-03 ENCOUNTER — Other Ambulatory Visit: Payer: Self-pay | Admitting: Cardiology

## 2021-09-05 ENCOUNTER — Other Ambulatory Visit: Payer: Self-pay | Admitting: Cardiology

## 2021-09-10 ENCOUNTER — Encounter: Payer: Self-pay | Admitting: Cardiology

## 2021-09-10 ENCOUNTER — Other Ambulatory Visit: Payer: Self-pay

## 2021-09-10 ENCOUNTER — Ambulatory Visit (INDEPENDENT_AMBULATORY_CARE_PROVIDER_SITE_OTHER): Payer: Medicare Other | Admitting: Cardiology

## 2021-09-10 VITALS — BP 118/58 | HR 61 | Ht 70.0 in | Wt 144.8 lb

## 2021-09-10 DIAGNOSIS — Z952 Presence of prosthetic heart valve: Secondary | ICD-10-CM | POA: Diagnosis not present

## 2021-09-10 DIAGNOSIS — I447 Left bundle-branch block, unspecified: Secondary | ICD-10-CM | POA: Diagnosis not present

## 2021-09-10 DIAGNOSIS — I428 Other cardiomyopathies: Secondary | ICD-10-CM

## 2021-09-10 DIAGNOSIS — E7849 Other hyperlipidemia: Secondary | ICD-10-CM | POA: Diagnosis not present

## 2021-09-10 DIAGNOSIS — I10 Essential (primary) hypertension: Secondary | ICD-10-CM

## 2021-09-10 NOTE — Progress Notes (Unsigned)
Primary Care Provider: Michael Boston, MD Cardiologist: Glenetta Hew, MD Electrophysiologist: None  Clinic Note: No chief complaint on file.  ===================================  ASSESSMENT/PLAN   Problem List Items Addressed This Visit   None   ===================================  HPI:    Cesar Bullock is a 81 y.o. male with a PMH below who presents today for ***.  Cesar Bullock was last seen on ***  Recent Hospitalizations: ***  Reviewed  CV studies:    The following studies were reviewed today: (if available, images/films reviewed: From Epic Chart or Care Everywhere) ***:   Interval History:   Cesar Bullock   CV Review of Symptoms (Summary) Cardiovascular ROS: {roscv:310661}  REVIEWED OF SYSTEMS   ROS  I have reviewed and (if needed) personally updated the patient's problem list, medications, allergies, past medical and surgical history, social and family history.   PAST MEDICAL HISTORY   Past Medical History:  Diagnosis Date   2019 novel coronavirus disease (COVID-19) 08/12/2019   -Follow-up test on August 25, 2019 not detected.  Again not detected  both July 13 and 26, 2021   Adenomatous colon polyp 2016   tubular   Disequilibrium    Elevated blood pressure reading without diagnosis of hypertension 05/03/2008   Qualifier: Diagnosis of  By: Larose Kells MD, [place] (hyperlipidemia)    HOH (hard of hearing)    Hyperglycemia 11/09/2012   Hypertension    Nuclear cataract 2018   Had surgery   RESOLVED: Chronic combined systolic (congestive) and diastolic (congestive) heart failure ([place]) 01/2020   Diagnosed 01/2020 - significant wgt gain - edema; fatigue & SOB/PND/orthopnea. => Echo EF 20-25%, critical AS. --> initially diuresed, then referred for TAVR -> EF up to 50-55% on 2nd Post-TAVR Echo!!!.  Grr II DD. -- NOW mostly HFpEF (EF 55-60% as of 03/2021!!!   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 !!!!   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 Angelena Form and Dr. Cyndia Bent    Sensorineural hearing loss, bilateral 2016   Strict hearing loss precautions   Severe aortic stenosis     PAST SURGICAL HISTORY   Past Surgical History:  Procedure Laterality Date   14d ZIO PATCH - CARDIAC EVENT MONITOR  03/2020   s/p TAVR: Predom: SR - min HR 49 bpm, Max 113 bpm, Avg 64 bpm. LBBB and 1  AVB.  Rare isolated PACs and PVCs.  PAC couplets and triplets noted.  For short runs of SVT 4-6 beats: Longest was 6 beats at 99 bpm, fastest was 5 beats at 135 bpm).  No evidence of CHB or HAVB.   BUBBLE STUDY  02/16/2020   Procedure: BUBBLE STUDY;  Surgeon: Elouise Munroe, MD;  Location: [facility] ENDOSCOPY;  Service: Cardiology;;   Cataract surgery  2018   Dr. Calvert Cantor   EYE SURGERY Bilateral    cataract   IR THORACENTESIS ASP PLEURAL SPACE W/IMG GUIDE  03/15/2020   Right side Pl. Effusion   RIGHT/LEFT HEART CATH AND CORONARY ANGIOGRAPHY N/A 02/16/2020   Procedure: RIGHT/LEFT HEART CATH AND CORONARY ANGIOGRAPHY;  Surgeon: Leonie Man, MD;  Location: [facility] INVASIVE CV LAB;; PRE-TAVR: Angiograpnhically NORMAL CORONARIES: Severe Valvular CM (EF ~25%, CO/CI 3.25 /1.82) Mod-Severe Pulm HTN (PAP-mean: 67/25  mmHg - 41 mmHg; PCPWP 27 mHg.  AoP 136/74 mmHg - MAP 99 mmHg. RVP-EDP: 67/4 mmHg - 9 mmHg, RAP 6 mmHg - mod RA dilation)   TEE WITHOUT CARDIOVERSION N/A 02/16/2020   Procedure: TRANSESOPHAGEAL ECHOCARDIOGRAM (TEE);  Surgeon: Elouise Munroe, MD;  Location: [place];  Service: Cardiology;  Laterality: N/A;   TEE WITHOUT CARDIOVERSION N/A 03/14/2020   Procedure: TRANSESOPHAGEAL ECHOCARDIOGRAM (TEE) - INRA-OP TAVR;  Surgeon: Ena Dawley, MD;  Location: [facility] CV LAB;  (Large left pleural effusion -> thoracentesis of 1.1 L); TransAoV  gradients improved from 65/40 mmHg to 9/6 mmHg post TAVR deployment. --> EF improved from 25-30% to 40-45%. Mod LA dilation & mild RA dilation. Mild-mod MR; Large L Lateral pleural effusion.    TONSILLECTOMY     age 43   TRANSCATHETER AORTIC VALVE REPLACEMENT, TRANSFEMORAL N/A 03/14/2020   Procedure: TRANSCATHETER AORTIC VALVE REPLACEMENT, TRANSFEMORAL;  Surgeon: Burnell Blanks, MD;  Location: [facility] INVASIVE CV LAB;  26 mm Edwards Sapien 3U THV - via TFA approach. AoP 101/47 mmHg (MAP 68 mmHg) - LVP-EDP 180/10 mmHg - 21 mmHg.   TRANSTHORACIC ECHOCARDIOGRAM  03/2017   [facility]): Mild LVH with normal LV contraction (LVEF 50%). Severely calcified aortic valve with mild regurgitation and moderate-severe stenosis (AoV velocity 4.4 m/s, AVA 0.6 cm^2). Mitral calcification with mild MR. Mild pulmonary hypertension. Normal RV size and function.   TRANSTHORACIC ECHOCARDIOGRAM  07/19/2015    Normal LV size with mild LVH. LVEF 55-60% with grade 2 diastolic dysfunction. Moderate AS and mild AI. MAC with mild MR. Mild LA enlargement. Normal RV size and function.   TRANSTHORACIC ECHOCARDIOGRAM  02/11/2020   EF 25-30% with severely reduced function global).  Moderately elevated PA pressures (~53 mmHg).  Severe AS with mild to moderate AI (AVA by VTI ~0.49 cm, mean gradient 41 mmHg. V max 3.25ms; AI py PHT 465 msec)   TRANSTHORACIC ECHOCARDIOGRAM  03/15/2020   A) POD #1 - s/p TAVR: Normal TAVR valve.  Mean gradient 11 mmHg.  No PVL.  EF improved to 40 to 45% with global HK.  Moderate concentric LVH.  GRII DD.  Large left pleural effusion with atelectasis.  Severe LA dilation.  Mild RA dilation.  Mild MR.;; B) 1 month s/p TAVR: EF 50 to 55%.  GRII DD.  Moderate LA dilation.  Normal PA pressures.  Normal functioning TAVR valve.  No PVL.  No sig stenosis.   TRANSTHORACIC ECHOCARDIOGRAM  03/06/2021   No significant change from prior study. 04/12/2020: LVEF 55-60%, TAVR with 12 mmHg mean gradient,  mild to moderate MR. GR 1 DD.    Immunization History  Administered Date(s) Administered   PFIZER(Purple Top)SARS-COV-2 Vaccination 08/27/2019, 09/21/2019    MEDICATIONS/ALLERGIES   Current Meds  Medication Sig   amLODipine (NORVASC) 10 MG tablet TAKE 1 TABLET BY MOUTH EVERY DAY   aspirin EC 81 MG tablet Take 81 mg by mouth daily. Swallow whole.   carvedilol (COREG) 3.125 MG tablet TAKE 1 TABLET BY MOUTH 2 TIMES DAILY.   furosemide (LASIX) 40 MG tablet Take 1 tablet (40 mg total) by mouth as needed. For tightness in your chest or worsening or shortness of breath   irbesartan (AVAPRO) 300 MG tablet TAKE 1 TABLET BY MOUTH EVERY DAY    No Known Allergies  SOCIAL HISTORY/FAMILY HISTORY   Reviewed in Epic:  Pertinent findings:  Social History   Tobacco Use   Smoking status: Never   Smokeless tobacco: Never  Vaping  Use   Vaping Use: Never used  Substance Use Topics   Alcohol use: Not Currently    Alcohol/week: 6.0 standard drinks    Types: 6 Shots of liquor per week    Comment: vodka when he stopped drinking roughly 70moago 05/30/2020   Drug use: No   Social History   Social History Narrative   Lives by himself   6 g-kids      Former [place] [place] [employer]      Exercise-minimal   Quit drinking alcohol in March 2021    OBJCTIVE -PE, EKG, labs   Wt Readings from Last 3 Encounters:  09/10/21 144 lb 12.8 oz (65.7 kg)  03/12/21 142 lb (64.4 kg)  08/17/20 148 lb 11.2 oz (67.4 kg)    Physical Exam: BP (!) 118/58    Pulse 61    Ht _0  (1.778 m)    Wt 144 lb 12.8 oz (65.7 kg)    SpO2 98%    BMI 20.78 kg/m  Physical Exam   Adult ECG Report  Rate: *** ;  Rhythm: {rhythm:17366};   Narrative Interpretation: ***  Recent Labs:  ***  Lab Results  Component Value Date   CHOL 232 (H) 06/12/2015   HDL 63.70 06/12/2015   LDLCALC 148 (H) 06/12/2015   LDLDIRECT 146.5 11/09/2012   TRIG 99.0 06/12/2015   CHOLHDL 4 06/12/2015   Lab Results  Component Value Date    CREATININE 0.97 08/01/2020   BUN 22 08/01/2020   NA 142 08/01/2020   K 4.6 08/01/2020   CL 105 08/01/2020   CO2 25 08/01/2020   CBC Latest Ref Rng & Units 03/15/2020 03/14/2020 03/14/2020  WBC 4.0 - 10.5 K/uL 7.5 - -  Hemoglobin 13.0 - 17.0 g/dL 10.4(L) 10.9(L) 10.5(L)  Hematocrit 39.0 - 52.0 % 33.0(L) 32.0(L) 31.0(L)  Platelets 150 - 400 K/uL 134(L) - -    Lab Results  Component Value Date   HGBA1C 6.0 (H) 03/10/2020   Lab Results  Component Value Date   TSH 2.67 06/12/2015    ==================================================  COVID-19 Education: The signs and symptoms of COVID-19 were discussed with the patient and how to seek care for testing (follow up with PCP or arrange E-visit).    I spent a total of ***minutes with the patient spent in direct patient consultation.  Additional time spent with chart review  / charting (studies, outside notes, etc): *** min Total Time: *** min  Current medicines are reviewed at length with the patient today.  (+/- concerns) ***  This visit occurred during the SARS-CoV-2 public health emergency.  Safety protocols were in place, including screening questions prior to the visit, additional usage of staff PPE, and extensive cleaning of exam room while observing appropriate contact time as indicated for disinfecting solutions.  Notice: This dictation was prepared with Dragon dictation along with smart phrase technology. Any transcriptional errors that result from this process are unintentional and may not be corrected upon review.  Studies Ordered:   No orders of the defined types were placed in this encounter.   Patient Instructions / Medication Changes & Studies & Tests Ordered   There are no Patient Instructions on file for this visit.     DGlenetta Hew M.D., M.S. Interventional Cardiologist   Pager # [phone] # [phone] [place] [address]  [facility] you for choosing [facility]!

## 2021-09-10 NOTE — Patient Instructions (Signed)

## 2021-09-14 ENCOUNTER — Ambulatory Visit: Payer: Medicare Other | Admitting: Cardiology

## 2021-09-15 ENCOUNTER — Encounter: Payer: Self-pay | Admitting: Cardiology

## 2021-09-30 ENCOUNTER — Encounter: Payer: Self-pay | Admitting: Cardiology

## 2021-09-30 NOTE — Assessment & Plan Note (Addendum)
LDL is nowhere near goal based on last check.  I would not be interested in taking a medication for her lipids.  We talked about briefly, but that he would prefer to avoid any additional medications. ? ?He eats sensibly, I am pretty TAVR cath showed minimal CAD. ?

## 2021-09-30 NOTE — Assessment & Plan Note (Signed)
About 18 months out from TAVR.  Due for follow-up echo this fall. ?NYHA class I CHF symptoms. ? ?? Continue lifelong aspirin.  Okay to hold 5 days before his surgeries or procedures. ? ?SBE prophylaxis discussed. ?

## 2021-09-30 NOTE — Assessment & Plan Note (Signed)
Dramatic improvement in EF following TAVR.  Also dramatic improvement in symptoms. ? ?Now only with NYHA class Ia CHF symptoms.  Minimal grade 1 diastolic function on echo. ? ?Continue carvedilol, irbesartan and amlodipine at stable doses. ?Has not really used any as needed furosemide in the last several months. ?

## 2021-09-30 NOTE — Assessment & Plan Note (Signed)
Blood pressure log looks great.  He is on stable doses of irbesartan carvedilol and amlodipine.  With resting heart rate of 61 bpm and first AV block, not to titrate carvedilol further, but otherwise BP looks great. ?

## 2021-10-02 DIAGNOSIS — H35371 Puckering of macula, right eye: Secondary | ICD-10-CM | POA: Diagnosis not present

## 2021-10-02 DIAGNOSIS — H43811 Vitreous degeneration, right eye: Secondary | ICD-10-CM | POA: Diagnosis not present

## 2021-10-02 DIAGNOSIS — H26493 Other secondary cataract, bilateral: Secondary | ICD-10-CM | POA: Diagnosis not present

## 2021-10-10 DIAGNOSIS — H43811 Vitreous degeneration, right eye: Secondary | ICD-10-CM | POA: Diagnosis not present

## 2021-10-10 DIAGNOSIS — H3561 Retinal hemorrhage, right eye: Secondary | ICD-10-CM | POA: Diagnosis not present

## 2021-10-10 DIAGNOSIS — H35373 Puckering of macula, bilateral: Secondary | ICD-10-CM | POA: Diagnosis not present

## 2021-10-10 DIAGNOSIS — H43822 Vitreomacular adhesion, left eye: Secondary | ICD-10-CM | POA: Diagnosis not present

## 2021-10-10 DIAGNOSIS — H35433 Paving stone degeneration of retina, bilateral: Secondary | ICD-10-CM | POA: Diagnosis not present

## 2021-11-07 ENCOUNTER — Encounter: Payer: Self-pay | Admitting: Cardiology

## 2021-11-07 ENCOUNTER — Other Ambulatory Visit: Payer: Self-pay

## 2021-11-07 MED ORDER — AMOXICILLIN 500 MG PO TABS: ORAL_TABLET | ORAL | 0 refills | Status: DC

## 2021-11-27 DIAGNOSIS — H35373 Puckering of macula, bilateral: Secondary | ICD-10-CM | POA: Diagnosis not present

## 2021-11-27 DIAGNOSIS — H35433 Paving stone degeneration of retina, bilateral: Secondary | ICD-10-CM | POA: Diagnosis not present

## 2021-11-27 DIAGNOSIS — H3561 Retinal hemorrhage, right eye: Secondary | ICD-10-CM | POA: Diagnosis not present

## 2021-11-27 DIAGNOSIS — H43811 Vitreous degeneration, right eye: Secondary | ICD-10-CM | POA: Diagnosis not present

## 2021-11-27 DIAGNOSIS — H43822 Vitreomacular adhesion, left eye: Secondary | ICD-10-CM | POA: Diagnosis not present

## 2021-12-02 ENCOUNTER — Other Ambulatory Visit: Payer: Self-pay | Admitting: Cardiology

## 2022-01-28 ENCOUNTER — Encounter: Payer: Self-pay | Admitting: Cardiology

## 2022-01-28 NOTE — Telephone Encounter (Signed)
Second BP logs.  Similarly is good.  [place]

## 2022-01-28 NOTE — Telephone Encounter (Signed)
BP log as usual looks great.  The "elevated and hypertension "levels are still very normal for someone on the medications you are on.  Those blood pressures are acceptable even even if it was closer to your average.  Looks great.

## 2022-01-29 NOTE — Telephone Encounter (Signed)
We can see how things are looking about, see you back in clinic.  Usually the meds are there to protect the heart as much is to treat the blood pressure.  I think we can be a little less vigorous with checking the blood pressures unless you are having symptoms though.  Glenetta Hew, MD

## 2022-02-05 DIAGNOSIS — R7989 Other specified abnormal findings of blood chemistry: Secondary | ICD-10-CM | POA: Diagnosis not present

## 2022-02-05 DIAGNOSIS — R7301 Impaired fasting glucose: Secondary | ICD-10-CM | POA: Diagnosis not present

## 2022-02-05 DIAGNOSIS — E785 Hyperlipidemia, unspecified: Secondary | ICD-10-CM | POA: Diagnosis not present

## 2022-02-05 DIAGNOSIS — Z125 Encounter for screening for malignant neoplasm of prostate: Secondary | ICD-10-CM | POA: Diagnosis not present

## 2022-02-05 DIAGNOSIS — I10 Essential (primary) hypertension: Secondary | ICD-10-CM | POA: Diagnosis not present

## 2022-02-12 DIAGNOSIS — I110 Hypertensive heart disease with heart failure: Secondary | ICD-10-CM | POA: Diagnosis not present

## 2022-02-12 DIAGNOSIS — E785 Hyperlipidemia, unspecified: Secondary | ICD-10-CM | POA: Diagnosis not present

## 2022-02-12 DIAGNOSIS — Z952 Presence of prosthetic heart valve: Secondary | ICD-10-CM | POA: Diagnosis not present

## 2022-02-12 DIAGNOSIS — I428 Other cardiomyopathies: Secondary | ICD-10-CM | POA: Diagnosis not present

## 2022-02-12 DIAGNOSIS — Z0000 Encounter for general adult medical examination without abnormal findings: Secondary | ICD-10-CM | POA: Diagnosis not present

## 2022-02-12 DIAGNOSIS — I700 Atherosclerosis of aorta: Secondary | ICD-10-CM | POA: Diagnosis not present

## 2022-02-12 DIAGNOSIS — Z1339 Encounter for screening examination for other mental health and behavioral disorders: Secondary | ICD-10-CM | POA: Diagnosis not present

## 2022-02-12 DIAGNOSIS — Z1331 Encounter for screening for depression: Secondary | ICD-10-CM | POA: Diagnosis not present

## 2022-02-14 ENCOUNTER — Encounter: Payer: Self-pay | Admitting: Cardiology

## 2022-02-14 NOTE — Telephone Encounter (Signed)
Called Cesar Bullock . Informed Cesar Bullock the person in question has an appointment already establish for 02/25/22.  Cesar Bullock stated he would inform the patient  Cesar Bullock

## 2022-03-18 ENCOUNTER — Encounter: Payer: Self-pay | Admitting: Cardiology

## 2022-03-18 ENCOUNTER — Ambulatory Visit: Payer: Medicare Other | Admitting: Cardiology

## 2022-04-15 MED ORDER — AMLODIPINE BESYLATE 10 MG PO TABS: 10.0000 mg | ORAL_TABLET | Freq: Every day | ORAL | 3 refills | Status: DC

## 2022-04-30 ENCOUNTER — Encounter: Payer: Self-pay | Admitting: Cardiology

## 2022-04-30 ENCOUNTER — Ambulatory Visit: Payer: Medicare Other | Attending: Cardiology | Admitting: Cardiology

## 2022-04-30 VITALS — BP 124/66 | HR 71 | Ht 70.0 in | Wt 145.4 lb

## 2022-04-30 DIAGNOSIS — I10 Essential (primary) hypertension: Secondary | ICD-10-CM | POA: Diagnosis not present

## 2022-04-30 DIAGNOSIS — Z952 Presence of prosthetic heart valve: Secondary | ICD-10-CM | POA: Diagnosis not present

## 2022-04-30 DIAGNOSIS — I350 Nonrheumatic aortic (valve) stenosis: Secondary | ICD-10-CM

## 2022-04-30 DIAGNOSIS — E7849 Other hyperlipidemia: Secondary | ICD-10-CM

## 2022-04-30 DIAGNOSIS — I428 Other cardiomyopathies: Secondary | ICD-10-CM

## 2022-04-30 NOTE — Progress Notes (Signed)
Primary Care Provider: Michael Boston, MD [place] Cardiologist: Glenetta Hew, MD Electrophysiologist: None  Clinic Note: Chief Complaint  Patient presents with   Follow-up    Delayed 52-monthnow 2 years post TAVR   ===================================  ASSESSMENT/PLAN   Problem List Items Addressed This Visit       Cardiology Problems   Valvular cardiomyopathy (HCC)-RESOLVED (Chronic)    Dramatic improvement in EF following TAVR.  This would indicate that the valvular myopathy was completely reversible at his stage of aortic stenosis.  Now status post TAVR.  EF back to normal with NYHA Class Ia CHF symptoms.  Due for follow-up echocardiogram for his TAVR valve and myopathy.  Plan:  Continue current dose of carvedilol irbesartan and amlodipine with adequate blood pressure control. No longer using furosemide.      Relevant Orders   ECHOCARDIOGRAM COMPLETE   Essential hypertension (Chronic)    Blood pressure been stable now for over a year.  Remains on stable medications.  He follows his blood pressures routinely with his Omron.  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  Remarkable recovery post TAVR.  NYHA class Ia CHF symptoms.  Minimal at all symptoms.  Well-controlled blood pressures and heart rates. No longer using diuretic.  Has PRN prescription for amoxicillin for SBE prophylaxis.  Lifelong aspirin but okay to hold for procedures or surgeries.      Relevant Orders   ECHOCARDIOGRAM COMPLETE   ===================================  HPI:    Cesar BAGOTis a 81y.o. male with a PMH below who presents today for 635-monthollow-up.  Notable  PMH: Severe Aortic Stenosis with Valvular Cardiomyopathy Initial presentation was EF 20 to 25% NYHA class III-IV CHF -> diuresed and referred for TAVR S/p TAVR: EF up to 50 to 60% 1 year post TAVR with NYHA class I-IIa symptoms. Well-controlled HTN  Cesar Bullock was last seen on 09/10/2021-doing very well.  Staying active with the lisinopril.  Doing yard work and gardening.  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  No cardiac symptoms of chest pain pressure or dyspnea.  No PND, orthopnea or edema.  He brought with him his Omron BP records which show blood pressure.  No arrhythmia symptoms.  Recent Hospitalizations: None  Reviewed  CV studies:    The following studies were reviewed today: (if available, images/films reviewed: From Epic Chart or Care Everywhere) No recent studies:  Interval History:   Cesar Bullock presents here today for routine 8-48-monthllow-up (initial 6-m36-monthlow-up was delayed).  The following notes may be a little off-and-on leg swelling at the end of the day nothing is all that worrisome to him.  His blood pressures been very well controlled on his Omron.  He is not really using the Lasix anymore but is thought about using it every now and then.  Denies any PND orthopnea or edema.  No chest pain pressure tightness with rest or exertion.  No syncope or near syncope.  No TIA or amaurosis fugax.  No rapid heartbeats or palpitations.  REVIEWED OF SYSTEMS   Review of Systems  Constitutional:  Negative for malaise/fatigue.  Respiratory: Negative.    Cardiovascular:  Negative for leg swelling.  Gastrointestinal:  Negative for blood in stool and melena.  Genitourinary:  Negative for hematuria.  Musculoskeletal:  Negative for falls and joint pain.  Neurological:  Negative for dizziness and focal weakness.  Psychiatric/Behavioral:  Negative for memory loss. The patient is not nervous/anxious and does not have insomnia.    All other systems reviewed and are negative.  I have reviewed and (if needed) personally updated the patient's problem list, medications, allergies, past medical and surgical history, social and family history.   PAST MEDICAL HISTORY   Past Medical History:  Diagnosis Date   2019 novel coronavirus disease (COVID-19) 08/12/2019   -Follow-up test on August 25, 2019 not detected.  Again not detected  both July 13 and 26, 2021   Adenomatous colon polyp 2016   tubular   Disequilibrium    Elevated blood pressure reading without diagnosis of hypertension 05/03/2008   Qualifier: Diagnosis of  By: Larose Kells MD, [place] (hyperlipidemia)    HOH (hard of hearing)    Hyperglycemia 11/09/2012   Hypertension    Nuclear cataract 2018   Had surgery   RESOLVED: Chronic combined systolic (congestive) and diastolic (congestive) heart failure ([place]) 01/2020   Diagnosed 01/2020 - significant wgt gain - edema; fatigue & SOB/PND/orthopnea. => Echo EF 20-25%, critical AS. --> initially diuresed, then referred for TAVR -> EF up to 50-55% on 2nd Post-TAVR Echo!!!.  Grr II DD. -- NOW mostly HFpEF (EF 55-60% as of 03/2021!!!   RESOLVED: Valvular cardiomyopathy ([place]) 01/2020   EF 20-25% with Critical/Severe AS --> COMPLETELY RESOLVED -- 03/2021 - EF 55-60%, Gr 1 DD. !!!!   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 Angelena Form and Dr. Cyndia Bent    Sensorineural hearing loss, bilateral 2016   Strict hearing loss precautions   Severe aortic stenosis     PAST SURGICAL HISTORY   Past Surgical History:  Procedure Laterality Date   14d ZIO PATCH - CARDIAC EVENT MONITOR  03/2020   s/p TAVR: Predom: SR - min HR 49 bpm, Max 113 bpm, Avg 64 bpm. LBBB and 1  AVB.  Rare isolated PACs and PVCs.  PAC couplets and triplets noted.  For short runs of SVT 4-6 beats: Longest was 6 beats at 99 bpm, fastest was 5 beats at  135 bpm).  No evidence of CHB or HAVB.   BUBBLE STUDY  02/16/2020   Procedure: BUBBLE STUDY;  Surgeon: Elouise Munroe, MD;  Location: [facility] ENDOSCOPY;  Service: Cardiology;;   Cataract surgery  2018   Dr. Calvert Cantor   EYE SURGERY Bilateral    cataract   IR THORACENTESIS ASP PLEURAL SPACE W/IMG GUIDE  03/15/2020   Right side Pl. Effusion   RIGHT/LEFT HEART CATH AND CORONARY ANGIOGRAPHY N/A 02/16/2020   Procedure: RIGHT/LEFT HEART CATH AND CORONARY ANGIOGRAPHY;  Surgeon: Leonie Man, MD;  Location: [facility] INVASIVE CV LAB;; PRE-TAVR: Angiograpnhically NORMAL CORONARIES: Severe Valvular CM (EF ~25%, CO/CI 3.25 /1.82) Mod-Severe Pulm HTN (PAP-mean: 67/25 mmHg - 41 mmHg; PCPWP 27 mHg.  AoP 136/74 mmHg - MAP 99 mmHg. RVP-EDP: 67/4 mmHg - 9 mmHg, RAP 6 mmHg - mod RA dilation)   TEE WITHOUT CARDIOVERSION N/A 02/16/2020   Procedure: TRANSESOPHAGEAL ECHOCARDIOGRAM (TEE);  Surgeon: Elouise Munroe, MD;  Location: [facility] ENDOSCOPY;  Service: Cardiology;  Laterality: N/A;   TEE WITHOUT CARDIOVERSION N/A 03/14/2020   Procedure: TRANSESOPHAGEAL ECHOCARDIOGRAM (TEE) - INRA-OP TAVR;  Surgeon: Ena Dawley, MD;  Location: [facility] CV LAB;  (Large left pleural effusion -> thoracentesis of 1.1 L); TransAoV gradients improved from 65/40 mmHg to 9/6 mmHg post TAVR deployment. --> EF improved from 25-30% to 40-45%. Mod LA dilation & mild RA dilation. Mild-mod MR; Large L Lateral pleural effusion.    TONSILLECTOMY     age 4   TRANSCATHETER AORTIC VALVE REPLACEMENT, TRANSFEMORAL N/A 03/14/2020   Procedure: TRANSCATHETER AORTIC VALVE REPLACEMENT, TRANSFEMORAL;  Surgeon: Burnell Blanks, MD;  Location: [facility] INVASIVE CV LAB;  26 mm Edwards Sapien 3U THV - via TFA approach. AoP 101/47 mmHg (MAP 68 mmHg) - LVP-EDP 180/10 mmHg - 21 mmHg.   TRANSTHORACIC ECHOCARDIOGRAM  03/2017   [facility]): Mild LVH with normal LV contraction (LVEF 50%). Severely calcified aortic valve with mild regurgitation and  moderate-severe stenosis (AoV velocity 4.4 m/s, AVA 0.6 cm^2). Mitral calcification with mild MR. Mild pulmonary hypertension. Normal RV size and function.   TRANSTHORACIC ECHOCARDIOGRAM  07/19/2015    Normal LV size with mild LVH. LVEF 55-60% with grade 2 diastolic dysfunction. Moderate AS and mild AI. MAC with mild MR. Mild LA enlargement. Normal RV size and function.   TRANSTHORACIC ECHOCARDIOGRAM  02/11/2020   EF 25-30% with severely reduced function global).  Moderately elevated PA pressures (~53 mmHg).  Severe AS with mild to moderate AI (AVA by VTI ~0.49 cm, mean gradient 41 mmHg. V max 3.21ms; AI py PHT 465 msec)   TRANSTHORACIC ECHOCARDIOGRAM  03/15/2020   A) POD #1 - s/p TAVR: Normal TAVR valve.  Mean gradient 11 mmHg.  No PVL.  EF improved to 40 to 45% with global HK.  Moderate concentric LVH.  GRII DD.  Large left pleural effusion with atelectasis.  Severe LA dilation.  Mild RA dilation.  Mild MR.;; B) 1 month s/p TAVR: EF 50 to 55%.  GRII DD.  Moderate LA dilation.  Normal PA pressures.  Normal functioning TAVR valve.  No PVL.  No sig stenosis.   TRANSTHORACIC ECHOCARDIOGRAM  03/06/2021   No significant change from prior study. 04/12/2020: LVEF 55-60%, TAVR with 12 mmHg mean gradient, mild to moderate MR. GR 1 DD.    Immunization History  Administered Date(s) Administered   PFIZER(Purple Top)SARS-COV-2 Vaccination 08/27/2019, 09/21/2019    MEDICATIONS/ALLERGIES   Current Meds  Medication Sig   amLODipine (NORVASC) 10 MG tablet Take 1 tablet (10 mg total) by mouth daily.   aspirin EC 81 MG tablet Take 81 mg by mouth daily. Swallow whole.   carvedilol (COREG) 3.125 MG tablet TAKE 1 TABLET BY MOUTH 2 TIMES DAILY.   furosemide (LASIX) 40 MG tablet Take 1 tablet (40 mg total) by mouth as needed. For tightness in your chest or worsening or shortness of breath   irbesartan (AVAPRO) 300 MG tablet TAKE 1 TABLET BY MOUTH EVERY DAY   [DISCONTINUED] amoxicillin (AMOXIL) 500 MG capsule  TAKE 2 GRAMS (4 TABLETS) ONE HOUR PRIOR TO ALL DENTAL VISITS.    No Known Allergies  SOCIAL HISTORY/FAMILY HISTORY   Reviewed in Epic:  Pertinent findings:  Social History   Tobacco Use   Smoking status: Never   Smokeless tobacco: Never  Vaping Use   Vaping Use: Never used  Substance Use Topics   Alcohol use: Not Currently    Alcohol/week: 6.0 standard drinks of alcohol  Types: 6 Shots of liquor per week    Comment: vodka when he stopped drinking roughly 66moago 05/30/2020   Drug use: No   Social History   Social History Narrative   Lives by himself   6 g-kids      Former [employer]      Exercise-minimal   Quit drinking alcohol in March 2021    OBJCTIVE -PE, EKG, labs   Wt Readings from Last 3 Encounters:  04/30/22 145 lb 6.4 oz (66 kg)  09/10/21 144 lb 12.8 oz (65.7 kg)  03/12/21 142 lb (64.4 kg)   Physical Exam: BP 124/66 (BP Location: Left Arm, Patient Position: Sitting, Cuff Size: Normal)   Pulse 71   Ht _0  (1.778 m)   Wt 145 lb 6.4 oz (66 kg)   SpO2 97%   BMI 20.86 kg/m  Physical Exam Vitals reviewed.  Constitutional:      General: He is not in acute distress.    Appearance: Normal appearance. He is normal weight. He is not ill-appearing or toxic-appearing.     Comments: Somewhat thin but not frail or weak.  Healthy-appearing.  HENT:     Head: Normocephalic and atraumatic.  Neck:     Vascular: No carotid bruit or JVD.  Cardiovascular:     Rate and Rhythm: Normal rate and regular rhythm. No extrasystoles are present.    Chest Wall: PMI is not displaced.     Pulses: Normal pulses.     Heart sounds: S1 normal and S2 normal. Murmur (Harsh 1/6 C-D SEM at RUSB) heard.     No friction rub. No gallop.  Pulmonary:     Effort: Pulmonary effort is normal. No respiratory distress.     Breath sounds: Normal breath sounds. No wheezing, rhonchi or rales.  Musculoskeletal:        General: No swelling. Normal range of motion.     Cervical  back: Normal range of motion and neck supple.  Skin:    General: Skin is warm and dry.  Neurological:     General: No focal deficit present.     Mental Status: He is alert and oriented to person, place, and time. Mental status is at baseline.     Gait: Gait normal.  Psychiatric:        Mood and Affect: Mood normal.        Behavior: Behavior normal.        Thought Content: Thought content normal.        Judgment: Judgment normal.     Adult ECG Report N/A  Recent Labs: Reviewed. Has not had recent labs done either lipids or chemistry. Lab Results  Component Value Date   CREATININE 0.97 08/01/2020   BUN 22 08/01/2020   NA 142 08/01/2020   K 4.6 08/01/2020   CL 105 08/01/2020   CO2 25 08/01/2020      Latest Ref Rng & Units 03/15/2020    3:47 AM 03/14/2020   12:13 PM 03/14/2020   10:26 AM  CBC  WBC 4.0 - 10.5 K/uL 7.5     Hemoglobin 13.0 - 17.0 g/dL 10.4  10.9  10.5   Hematocrit 39.0 - 52.0 % 33.0  32.0  31.0   Platelets 150 - 400 K/uL 134       Lab Results  Component Value Date   HGBA1C 6.0 (H) 03/10/2020   Lab Results  Component Value Date   TSH 2.67 06/12/2015    ================================================== I spent  a total of 39 minutes with the patient spent in direct patient consultation.  Additional time spent with chart review  / charting (studies, outside notes, etc): 16 min Total Time: 55 min  Current medicines are reviewed at length with the patient today.  (+/- concerns) none  Notice: This dictation was prepared with Dragon dictation along with smart phrase technology. Any transcriptional errors that result from this process are unintentional and may not be corrected upon review.  Studies Ordered:   Orders Placed This Encounter  Procedures   ECHOCARDIOGRAM COMPLETE   No orders of the defined types were placed in this encounter.   Patient Instructions / Medication Changes & Studies & Tests Ordered   Patient Instructions  Medication  Instructions:  No changes  *If you need a refill on your cardiac medications before your next appointment, please call your pharmacy*   Lab Work: Not needed   Testing/Procedures:  Will be schedule at [place] has requested that you have an echocardiogram. Echocardiography is a painless test that uses sound waves to create images of your heart. It provides your doctor with information about the size and shape of your heart and how well your heart's chambers and valves are working. This procedure takes approximately one hour. There are no restrictions for this procedure. Please do NOT wear cologne, perfume, aftershave, or lotions (deodorant is allowed). Please arrive 15 minutes prior to your appointment time.   Follow-Up: At [facility], you and your health needs are our priority.  As part of our continuing mission to provide you with exceptional heart care, we have created designated Provider Care Teams.  These Care Teams include your primary Cardiologist (physician) and Advanced Practice Providers (APPs -  Physician Assistants and Nurse Practitioners) who all work together to provide you with the care you need, when you need it.     Your next appointment:   6 month(s)  The format for your next appointment:   In Person  Provider:   Glenetta Hew, MD    Other Instructions      Leonie Man, MD, MS Glenetta Hew, M.D., M.S. Interventional Cardiologist  [facility]  Pager # [phone] Phone # [phone] [address] [place], [place] [address]   Thank you for choosing [facility] [facility]!!

## 2022-04-30 NOTE — Patient Instructions (Signed)
Medication Instructions:  No changes  *If you need a refill on your cardiac medications before your next appointment, please call your pharmacy*   Lab Work: Not needed   Testing/Procedures:  Will be schedule at [place] has requested that you have an echocardiogram. Echocardiography is a painless test that uses sound waves to create images of your heart. It provides your doctor with information about the size and shape of your heart and how well your heart's chambers and valves are working. This procedure takes approximately one hour. There are no restrictions for this procedure. Please do NOT wear cologne, perfume, aftershave, or lotions (deodorant is allowed). Please arrive 15 minutes prior to your appointment time.   Follow-Up: At [facility], you and your health needs are our priority.  As part of our continuing mission to provide you with exceptional heart care, we have created designated Provider Care Teams.  These Care Teams include your primary Cardiologist (physician) and Advanced Practice Providers (APPs -  Physician Assistants and Nurse Practitioners) who all work together to provide you with the care you need, when you need it.     Your next appointment:   6 month(s)  The format for your next appointment:   In Person  Provider:   Glenetta Hew, MD    Other Instructions

## 2022-05-09 MED ORDER — AMOXICILLIN 500 MG PO CAPS: ORAL_CAPSULE | ORAL | 0 refills | Status: DC

## 2022-05-09 NOTE — Addendum Note (Signed)
Addended by: Betha Loa F on: 05/09/2022 11:16 AM   Modules accepted: Orders

## 2022-05-19 ENCOUNTER — Encounter: Payer: Self-pay | Admitting: Cardiology

## 2022-05-19 NOTE — Assessment & Plan Note (Signed)
Dramatic improvement in EF following TAVR.  This would indicate that the valvular myopathy was completely reversible at his stage of aortic stenosis.  Now status post TAVR.  EF back to normal with NYHA Class Ia CHF symptoms.  Due for follow-up echocardiogram for his TAVR valve and myopathy.  Plan:  Continue current dose of carvedilol irbesartan and amlodipine with adequate blood pressure control. No longer using furosemide.

## 2022-05-19 NOTE — Assessment & Plan Note (Signed)
Just over 2 years out from TAVR.  Remarkable recovery post TAVR.  NYHA class Ia CHF symptoms.  Minimal at all symptoms.  Well-controlled blood pressures and heart rates. No longer using diuretic.  Has PRN prescription for amoxicillin for SBE prophylaxis.  Lifelong aspirin but okay to hold for procedures or surgeries.

## 2022-05-19 NOTE — Assessment & Plan Note (Signed)
Blood pressure been stable now for over a year.  Remains on stable medications.  He follows his blood pressures routinely with his Omron.  Plan: Continue current meds: Amlodipine 10 mg daily, carvedilol 3.125 mg twice daily, Avapro 300 mg

## 2022-05-27 ENCOUNTER — Other Ambulatory Visit (HOSPITAL_COMMUNITY): Payer: Medicare Other

## 2022-05-27 ENCOUNTER — Ambulatory Visit (HOSPITAL_COMMUNITY): Payer: Medicare Other | Attending: Cardiology

## 2022-05-27 DIAGNOSIS — Z952 Presence of prosthetic heart valve: Secondary | ICD-10-CM | POA: Diagnosis not present

## 2022-05-27 DIAGNOSIS — I350 Nonrheumatic aortic (valve) stenosis: Secondary | ICD-10-CM | POA: Insufficient documentation

## 2022-05-27 DIAGNOSIS — I428 Other cardiomyopathies: Secondary | ICD-10-CM | POA: Diagnosis not present

## 2022-05-27 LAB — ECHOCARDIOGRAM COMPLETE
AV Mean grad: 13.4 mmHg
AV Peak grad: 22.5 mmHg
Ao pk vel: 2.37 m/s
Area-P 1/2: 2.55 cm2
S' Lateral: 3.2 cm

## 2022-06-08 ENCOUNTER — Encounter: Payer: Self-pay | Admitting: Cardiology

## 2022-06-14 MED ORDER — IRBESARTAN 300 MG PO TABS: 300.0000 mg | ORAL_TABLET | Freq: Every day | ORAL | 3 refills | Status: DC

## 2022-08-15 DIAGNOSIS — Z125 Encounter for screening for malignant neoplasm of prostate: Secondary | ICD-10-CM | POA: Diagnosis not present

## 2022-08-25 ENCOUNTER — Encounter: Payer: Self-pay | Admitting: Cardiology

## 2022-08-30 NOTE — Telephone Encounter (Signed)
If symptoms have resolved, we will just continue taking meds.  Glenetta Hew, MD

## 2022-09-10 ENCOUNTER — Encounter: Payer: Self-pay | Admitting: Cardiology

## 2022-09-10 MED ORDER — CARVEDILOL 3.125 MG PO TABS: 3.1250 mg | ORAL_TABLET | Freq: Two times a day (BID) | ORAL | 3 refills | Status: DC

## 2022-10-16 DIAGNOSIS — H35371 Puckering of macula, right eye: Secondary | ICD-10-CM | POA: Diagnosis not present

## 2022-10-16 DIAGNOSIS — H43811 Vitreous degeneration, right eye: Secondary | ICD-10-CM | POA: Diagnosis not present

## 2022-10-16 DIAGNOSIS — H35342 Macular cyst, hole, or pseudohole, left eye: Secondary | ICD-10-CM | POA: Diagnosis not present

## 2022-10-16 DIAGNOSIS — H26493 Other secondary cataract, bilateral: Secondary | ICD-10-CM | POA: Diagnosis not present

## 2022-10-17 ENCOUNTER — Encounter: Payer: Self-pay | Admitting: Cardiology

## 2022-12-19 ENCOUNTER — Telehealth: Payer: Self-pay | Admitting: Cardiology

## 2022-12-19 NOTE — Telephone Encounter (Signed)
Patient stated he had swelling in his feet 2 weeks ago. No swelling currently. He wanted to discuss at his yearly visit. He was calling to make appointment for his yearly because he received letter to schedule. You are currently booked out until new year. Patient would only like to see you. He was really upset about the scheduling. He would like a call from Nurse Jasmine December to see if he can be worked in. Offered appointment with APP. Patient declined

## 2022-12-19 NOTE — Telephone Encounter (Signed)
Pt sent this via mychart to the scheduling pool:     If swelling, where is the swelling located?  Feet and ankles - Not too much at this time.   How much weight have you gained and in what time span?  None  (my weight is 131-134 as usual)   Have you gained 3 pounds in a day or 5 pounds in a week?  Never!!   Do you have a log of your daily weights (if so, list)?  No.   Are you currently taking a fluid pill?  Dr. Herbie Baltimore has my Meds list.  I am not sure what each of the 3 Meds, plus baby aspirin are for.   Are you currently SOB?  No (after looking up SOB)   Have you traveled recently?   To [place] a few weeks ago for the weekend.    Can you tell me a little more about your swelling?      If swelling, where is the swelling located?   How much weight have you gained and in what time span?   Have you gained 3 pounds in a day or 5 pounds in a week?   Do you have a log of your daily weights (if so, list)?   Are you currently taking a fluid pill?   Are you currently SOB?   Have you traveled recently?       Is it not possible to schedule an appointment at ANY date, and await a "no show" for sooner.   I have had some occasional feet swelling, which i think should be discussed.

## 2023-01-06 NOTE — Telephone Encounter (Signed)
Called  spoke to patient . Appointment schedule for  02/18/23  at 3:30 pm   Patient verbalized understanding.

## 2023-02-03 ENCOUNTER — Encounter: Payer: Self-pay | Admitting: Cardiology

## 2023-02-13 DIAGNOSIS — I110 Hypertensive heart disease with heart failure: Secondary | ICD-10-CM | POA: Diagnosis not present

## 2023-02-13 DIAGNOSIS — Z125 Encounter for screening for malignant neoplasm of prostate: Secondary | ICD-10-CM | POA: Diagnosis not present

## 2023-02-13 DIAGNOSIS — R7301 Impaired fasting glucose: Secondary | ICD-10-CM | POA: Diagnosis not present

## 2023-02-13 DIAGNOSIS — E785 Hyperlipidemia, unspecified: Secondary | ICD-10-CM | POA: Diagnosis not present

## 2023-02-13 DIAGNOSIS — I5022 Chronic systolic (congestive) heart failure: Secondary | ICD-10-CM | POA: Diagnosis not present

## 2023-02-18 ENCOUNTER — Ambulatory Visit: Payer: Medicare Other | Attending: Cardiology | Admitting: Cardiology

## 2023-02-18 VITALS — BP 122/72 | HR 51 | Ht 69.0 in | Wt 138.6 lb

## 2023-02-18 DIAGNOSIS — I428 Other cardiomyopathies: Secondary | ICD-10-CM | POA: Diagnosis not present

## 2023-02-18 DIAGNOSIS — Z952 Presence of prosthetic heart valve: Secondary | ICD-10-CM | POA: Diagnosis not present

## 2023-02-18 DIAGNOSIS — I10 Essential (primary) hypertension: Secondary | ICD-10-CM | POA: Diagnosis not present

## 2023-02-18 NOTE — Patient Instructions (Addendum)
Medication Instructions:   No changes *If you need a refill on your cardiac medications before your next appointment, please call your pharmacy*   Lab Work: Not needed    Testing/Procedures: Will be schedule in Oct /Nov 2024 at [facility] [address]  Your physician has requested that you have an echocardiogram. Echocardiography is a painless test that uses sound waves to create images of your heart. It provides your doctor with information about the size and shape of your heart and how well your heart's chambers and valves are working. This procedure takes approximately one hour. There are no restrictions for this procedure. Please do NOT wear cologne, perfume, aftershave, or lotions (deodorant is allowed). Please arrive 15 minutes prior to your appointment time.    Follow-Up: At [facility], you and your health needs are our priority.  As part of our continuing mission to provide you with exceptional heart care, we have created designated Provider Care Teams.  These Care Teams include your primary Cardiologist (physician) and Advanced Practice Providers (APPs -  Physician Assistants and Nurse Practitioners) who all work together to provide you with the care you need, when you need it.     Your next appointment:   12 month(s)  The format for your next appointment:   In Person  Provider:   Bryan Lemma, MD    Other Instructions

## 2023-02-18 NOTE — Progress Notes (Signed)
Cardiology Office Note:  .   Date:  02/23/2023  ID:  Cesar Bullock, DOB 06-28-1941, MRN [id number] PCP: Melida Quitter, MD  [facility] Providers Cardiologist:  Bryan Lemma, MD     Chief Complaint  Patient presents with   Follow-up    Early annual follow-up.  Had noted some edema.   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    History of Present Illness: .     Cesar Bullock is a 82 y.o. male with a PMH notable for history of TAVR for severe critical AS with resolved Valvular Cardiomyopathy (now with essentially recovery of cardiac function), and hypertension as well as occasional edema who presents here for early annual follow-up to discuss edema at the request of Melida Quitter, MD.  Notable PMH: Severe Aortic Stenosis with Valvular Cardiomyopathy Initial presentation was EF 20 to 25% NYHA class III-IV CHF -> diuresed and referred for TAVR S/p TAVR (03/14/2020): EF up to 50 to 60% 1 year post TAVR with NYHA class I-IIa symptoms. Well-controlled HTN  Cesar Bullock was last seen on 04/30/2022 as a 46-month follow-up doing well.  Little off-and-on leg swelling at the end of the day but not worrisome to him.  Blood pressures were stable.  Was not really using Lasix that much anymore.  No PND or orthopnea.  No other cardiac symptoms.  Negative ROS.  He called in in June noticing some swelling in his feet.  Weight was stable at 131 134 pounds.   Subjective   INTERVAL HISTORY Cesar Bullock returns here today for routine follow-up stating that his swelling is definitely better than it was on when he called.  He had been reluctant to take his Lasix but has not really been using any says that for couple days. He is doing remarkably well from cardiac standpoint and is not having any active cardiac symptoms to speak of.  He is very happy with his overall recovery and  level of activity.  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  He still enjoys walking with his dog and and doing some routine exercise.  He follows his blood pressures that been really well-controlled.  He has not had any further CHF symptoms of PND, edema.  No exertional dyspnea.  No chest pain or pressure with rest or exertion.  ROS:  Cardiovascular ROS: negative for - chest pain, dyspnea on exertion, irregular heartbeat, palpitations, paroxysmal nocturnal dyspnea, rapid heart rate, shortness of breath, or syncope or near syncope TIA/CVA or amaurosis fugax, claudication He does note ringing in both ears that is quite bothersome for him. Review of Systems - Negative except symptoms noted above.  The ringing in his ears is more concerning than anything.     Objective   Studies Reviewed: Marland Kitchen   EKG Interpretation Date/Time:  Tuesday February 18 2023 15:18:16 EDT Ventricular Rate:  51 PR Interval:  236 QRS Duration:  136 QT Interval:  472 QTC Calculation: 435 R Axis:   -61  Text Interpretation: Sinus bradycardia with 1st degree A-V block Left axis deviation Non-specific intra-ventricular conduction block Minimal voltage criteria for LVH, may be normal variant ( Cornell product ) Cannot rule out Anteroseptal infarct (cited on or before 18-Feb-2023) No significant change since last tracing Confirmed  by Bryan Lemma ([id number]) on 02/18/2023 3:50:51 PM   ECHO 05/27/2022: Normal LVEF 60 to 65%.  Indeterminate diastolic parameters with elevated LVEDP.  Normal RV.  Aortic valve prosthesis normal.  Mean gradient 13.4 mm -> essentially stable  Risk Assessment/Calculations:             Physical Exam:   VS:  BP 122/72   Pulse (!) 51   Ht 5\' 9"  (1.753 m)   Wt 138 lb 9.6 oz (62.9 kg)   SpO2 98%   BMI 20.47 kg/m    Wt Readings from Last 3 Encounters:  02/18/23 138 lb 9.6 oz (62.9 kg)  04/30/22 145 lb 6.4 oz (66  kg)  09/10/21 144 lb 12.8 oz (65.7 kg)    GEN: Well nourished, well developed in no acute distress; thin but not frail or weak.  Healthy-appearing.  Stable. NECK: No JVD; No carotid bruits CARDIAC: Normal S1, S2; RRR, harsh 1/6 SEM at RUSB.  No rubs, gallops RESPIRATORY:  Clear to auscultation without rales, wheezing or rhonchi ; nonlabored, good air movement. ABDOMEN: Soft, non-tender, non-distended EXTREMITIES:  No edema; No deformity     ASSESSMENT AND PLAN: .    Problem List Items Addressed This Visit       Cardiology Problems   Valvular cardiomyopathy (HCC)-RESOLVED - Primary (Chronic)    Remarkable recovery after TAVR.  No further CHF symptoms -> remains stable therapy with ARB and high-dose along with amlodipine and high-dose with low-dose carvedilol-unable to titrate further because of bradycardia.  Not requiring standing furosemide.  Has barely used any since last visit.      Relevant Orders   ECHOCARDIOGRAM COMPLETE   Essential hypertension (Chronic)    He has been following his BP closely.  Stable regimen.  No change from last year.  I am asked to keep an eye on his heart rate to ensure that we are not getting to a point stop the beta-blocker for fear of worsening bradycardia.        Other   S/P TAVR (transcatheter aortic valve replacement) (Chronic)    Almost 3 years post TAVR doing remarkably well.  No active symptoms-NYHA class I.  Has maybe used Lasix twice in the last year. On stable regimen of ARB thousand channel blocker and beta-blocker.  Lifelong aspirin-okay to hold for procedures. Prophylactic amoxicillin or for dental or GI procedures.  SBE prophylaxis discussed.      Relevant Orders   EKG 12-Lead (Completed)   ECHOCARDIOGRAM COMPLETE           Dispo: Return in about 1 year (around 02/18/2024).  Total time spent: 23 min spent with patient + 12 min spent charting = 35 min     Signed, Marykay Lex, MD, MS Bryan Lemma, M.D.,  M.S. Interventional Cardiologist  [facility]  Pager # [phone] Phone # [phone] [address] [place], [place] [address]

## 2023-02-19 NOTE — Telephone Encounter (Signed)
Love the picture.  Hope this makes sense.  Bryan Lemma, MD

## 2023-02-20 DIAGNOSIS — Z1339 Encounter for screening examination for other mental health and behavioral disorders: Secondary | ICD-10-CM | POA: Diagnosis not present

## 2023-02-20 DIAGNOSIS — R202 Paresthesia of skin: Secondary | ICD-10-CM | POA: Diagnosis not present

## 2023-02-20 DIAGNOSIS — I700 Atherosclerosis of aorta: Secondary | ICD-10-CM | POA: Diagnosis not present

## 2023-02-20 DIAGNOSIS — Z0000 Encounter for general adult medical examination without abnormal findings: Secondary | ICD-10-CM | POA: Diagnosis not present

## 2023-02-20 DIAGNOSIS — I428 Other cardiomyopathies: Secondary | ICD-10-CM | POA: Diagnosis not present

## 2023-02-20 DIAGNOSIS — I10 Essential (primary) hypertension: Secondary | ICD-10-CM | POA: Diagnosis not present

## 2023-02-20 DIAGNOSIS — E785 Hyperlipidemia, unspecified: Secondary | ICD-10-CM | POA: Diagnosis not present

## 2023-02-20 DIAGNOSIS — Z1331 Encounter for screening for depression: Secondary | ICD-10-CM | POA: Diagnosis not present

## 2023-02-20 DIAGNOSIS — Z952 Presence of prosthetic heart valve: Secondary | ICD-10-CM | POA: Diagnosis not present

## 2023-02-20 DIAGNOSIS — R7301 Impaired fasting glucose: Secondary | ICD-10-CM | POA: Diagnosis not present

## 2023-02-23 ENCOUNTER — Encounter: Payer: Self-pay | Admitting: Cardiology

## 2023-02-23 NOTE — Assessment & Plan Note (Signed)
Remarkable recovery after TAVR.  No further CHF symptoms -> remains stable therapy with ARB and high-dose along with amlodipine and high-dose with low-dose carvedilol-unable to titrate further because of bradycardia.  Not requiring standing furosemide.  Has barely used any since last visit.

## 2023-02-23 NOTE — Assessment & Plan Note (Signed)
Almost 3 years post TAVR doing remarkably well.  No active symptoms-NYHA class I.  Has maybe used Lasix twice in the last year. On stable regimen of ARB thousand channel blocker and beta-blocker.  Lifelong aspirin-okay to hold for procedures. Prophylactic amoxicillin or for dental or GI procedures.  SBE prophylaxis discussed.

## 2023-02-23 NOTE — Assessment & Plan Note (Addendum)
He has been following his BP closely.  Stable regimen.  No change from last year.  I am asked to keep an eye on his heart rate to ensure that we are not getting to a point stop the beta-blocker for fear of worsening bradycardia.

## 2023-03-08 ENCOUNTER — Other Ambulatory Visit: Payer: Self-pay | Admitting: Cardiology

## 2023-03-17 NOTE — Telephone Encounter (Signed)
I do not see any reason why you need to avoid your friend who is on chemotherapy.  It was worth a shot to see if the aspirin was causing tinnitus. ??  Cannot remember if you seen ENT about this.   Bryan Lemma, MD

## 2023-04-08 DIAGNOSIS — Z23 Encounter for immunization: Secondary | ICD-10-CM | POA: Diagnosis not present

## 2023-04-16 MED ORDER — AMLODIPINE BESYLATE 10 MG PO TABS: 10.0000 mg | ORAL_TABLET | Freq: Every day | ORAL | 3 refills | Status: DC

## 2023-04-16 NOTE — Addendum Note (Signed)
Addended by: Margaret Pyle D on: 04/16/2023 07:42 AM   Modules accepted: Orders

## 2023-04-23 ENCOUNTER — Ambulatory Visit (HOSPITAL_COMMUNITY): Payer: Medicare Other | Attending: Cardiology

## 2023-04-23 DIAGNOSIS — Z952 Presence of prosthetic heart valve: Secondary | ICD-10-CM | POA: Insufficient documentation

## 2023-04-23 DIAGNOSIS — I428 Other cardiomyopathies: Secondary | ICD-10-CM | POA: Diagnosis not present

## 2023-04-23 LAB — ECHOCARDIOGRAM COMPLETE
AR max vel: 1.34 cm2
AV Area VTI: 1.34 cm2
AV Area mean vel: 1.22 cm2
AV Mean grad: 18.4 mm[Hg]
AV Peak grad: 29.7 mm[Hg]
Ao pk vel: 2.73 m/s
Area-P 1/2: 3.03 cm2
P 1/2 time: 475 ms
S' Lateral: 3 cm

## 2023-04-24 ENCOUNTER — Other Ambulatory Visit: Payer: Self-pay | Admitting: Physician Assistant

## 2023-04-24 DIAGNOSIS — Z952 Presence of prosthetic heart valve: Secondary | ICD-10-CM

## 2023-05-18 ENCOUNTER — Other Ambulatory Visit: Payer: Self-pay | Admitting: Cardiology

## 2023-05-20 DIAGNOSIS — E538 Deficiency of other specified B group vitamins: Secondary | ICD-10-CM | POA: Diagnosis not present

## 2023-08-29 ENCOUNTER — Other Ambulatory Visit: Payer: Self-pay | Admitting: Cardiology

## 2023-10-22 DIAGNOSIS — H35342 Macular cyst, hole, or pseudohole, left eye: Secondary | ICD-10-CM | POA: Diagnosis not present

## 2023-10-22 DIAGNOSIS — H43811 Vitreous degeneration, right eye: Secondary | ICD-10-CM | POA: Diagnosis not present

## 2023-10-22 DIAGNOSIS — H35371 Puckering of macula, right eye: Secondary | ICD-10-CM | POA: Diagnosis not present

## 2023-10-22 DIAGNOSIS — H26493 Other secondary cataract, bilateral: Secondary | ICD-10-CM | POA: Diagnosis not present

## 2023-11-03 ENCOUNTER — Ambulatory Visit (HOSPITAL_COMMUNITY): Payer: Medicare Other | Attending: Cardiovascular Disease

## 2023-11-03 DIAGNOSIS — Z952 Presence of prosthetic heart valve: Secondary | ICD-10-CM | POA: Diagnosis not present

## 2023-11-03 DIAGNOSIS — I341 Nonrheumatic mitral (valve) prolapse: Secondary | ICD-10-CM | POA: Diagnosis not present

## 2023-11-03 LAB — ECHOCARDIOGRAM COMPLETE
AV Mean grad: 16.7 mmHg
AV Peak grad: 30.1 mmHg
Ao pk vel: 2.74 m/s
Area-P 1/2: 3.08 cm2
P 1/2 time: 621 ms
S' Lateral: 2.3 cm

## 2024-02-22 ENCOUNTER — Other Ambulatory Visit: Payer: Self-pay | Admitting: Cardiology

## 2024-02-25 DIAGNOSIS — E785 Hyperlipidemia, unspecified: Secondary | ICD-10-CM | POA: Diagnosis not present

## 2024-02-25 DIAGNOSIS — Z125 Encounter for screening for malignant neoplasm of prostate: Secondary | ICD-10-CM | POA: Diagnosis not present

## 2024-02-25 DIAGNOSIS — R7301 Impaired fasting glucose: Secondary | ICD-10-CM | POA: Diagnosis not present

## 2024-02-25 DIAGNOSIS — I10 Essential (primary) hypertension: Secondary | ICD-10-CM | POA: Diagnosis not present

## 2024-03-04 DIAGNOSIS — R7301 Impaired fasting glucose: Secondary | ICD-10-CM | POA: Diagnosis not present

## 2024-03-04 DIAGNOSIS — I10 Essential (primary) hypertension: Secondary | ICD-10-CM | POA: Diagnosis not present

## 2024-03-04 DIAGNOSIS — R202 Paresthesia of skin: Secondary | ICD-10-CM | POA: Diagnosis not present

## 2024-03-04 DIAGNOSIS — E538 Deficiency of other specified B group vitamins: Secondary | ICD-10-CM | POA: Diagnosis not present

## 2024-03-04 DIAGNOSIS — E785 Hyperlipidemia, unspecified: Secondary | ICD-10-CM | POA: Diagnosis not present

## 2024-03-04 DIAGNOSIS — I428 Other cardiomyopathies: Secondary | ICD-10-CM | POA: Diagnosis not present

## 2024-03-04 DIAGNOSIS — Z1339 Encounter for screening examination for other mental health and behavioral disorders: Secondary | ICD-10-CM | POA: Diagnosis not present

## 2024-03-04 DIAGNOSIS — Z23 Encounter for immunization: Secondary | ICD-10-CM | POA: Diagnosis not present

## 2024-03-04 DIAGNOSIS — I700 Atherosclerosis of aorta: Secondary | ICD-10-CM | POA: Diagnosis not present

## 2024-03-04 DIAGNOSIS — Z1331 Encounter for screening for depression: Secondary | ICD-10-CM | POA: Diagnosis not present

## 2024-03-04 DIAGNOSIS — Z952 Presence of prosthetic heart valve: Secondary | ICD-10-CM | POA: Diagnosis not present

## 2024-03-04 DIAGNOSIS — Z0000 Encounter for general adult medical examination without abnormal findings: Secondary | ICD-10-CM | POA: Diagnosis not present

## 2024-03-18 ENCOUNTER — Other Ambulatory Visit: Payer: Self-pay | Admitting: Cardiology

## 2024-04-19 ENCOUNTER — Other Ambulatory Visit: Payer: Self-pay | Admitting: Cardiology

## 2024-04-27 ENCOUNTER — Other Ambulatory Visit: Payer: Self-pay | Admitting: Cardiology

## 2024-06-08 ENCOUNTER — Other Ambulatory Visit: Payer: Self-pay | Admitting: Cardiology

## 2024-06-19 ENCOUNTER — Other Ambulatory Visit: Payer: Self-pay | Admitting: Cardiology

## 2024-06-23 ENCOUNTER — Other Ambulatory Visit: Payer: Self-pay | Admitting: Cardiology

## 2024-07-04 ENCOUNTER — Encounter: Payer: Self-pay | Admitting: Cardiology

## 2024-07-05 ENCOUNTER — Ambulatory Visit: Attending: Cardiology | Admitting: Cardiology

## 2024-07-05 ENCOUNTER — Encounter: Payer: Self-pay | Admitting: Cardiology

## 2024-07-05 VITALS — BP 116/53 | HR 56 | Resp 16 | Ht 69.0 in | Wt 136.2 lb

## 2024-07-05 DIAGNOSIS — I051 Rheumatic mitral insufficiency: Secondary | ICD-10-CM | POA: Insufficient documentation

## 2024-07-05 DIAGNOSIS — I428 Other cardiomyopathies: Secondary | ICD-10-CM | POA: Insufficient documentation

## 2024-07-05 DIAGNOSIS — I10 Essential (primary) hypertension: Secondary | ICD-10-CM | POA: Insufficient documentation

## 2024-07-05 DIAGNOSIS — E7849 Other hyperlipidemia: Secondary | ICD-10-CM | POA: Diagnosis present

## 2024-07-05 DIAGNOSIS — Z952 Presence of prosthetic heart valve: Secondary | ICD-10-CM | POA: Insufficient documentation

## 2024-07-05 DIAGNOSIS — I340 Nonrheumatic mitral (valve) insufficiency: Secondary | ICD-10-CM | POA: Diagnosis not present

## 2024-07-05 MED ORDER — CARVEDILOL 3.125 MG PO TABS: 3.1250 mg | ORAL_TABLET | Freq: Two times a day (BID) | ORAL | 3 refills | Status: AC

## 2024-07-05 MED ORDER — FUROSEMIDE 40 MG PO TABS: 40.0000 mg | ORAL_TABLET | ORAL | 6 refills | Status: AC | PRN

## 2024-07-05 MED ORDER — IRBESARTAN 300 MG PO TABS: 300.0000 mg | ORAL_TABLET | Freq: Every day | ORAL | 3 refills | Status: AC

## 2024-07-05 MED ORDER — AMLODIPINE BESYLATE 10 MG PO TABS: 10.0000 mg | ORAL_TABLET | Freq: Every day | ORAL | 3 refills | Status: AC

## 2024-07-05 MED ORDER — AMOXICILLIN 500 MG PO CAPS: ORAL_CAPSULE | ORAL | 0 refills | Status: AC

## 2024-07-05 MED ORDER — ROSUVASTATIN CALCIUM 10 MG PO TABS: 10.0000 mg | ORAL_TABLET | Freq: Every day | ORAL | 3 refills | Status: AC

## 2024-07-05 NOTE — Progress Notes (Signed)
 " Cardiology Office Note:  .   Date:  07/10/2024  ID:  Cesar Bullock, DOB [birth year], MRN [id number] PCP: Stephane Leita DEL, MD  [facility] Providers Cardiologist:  Alm Clay, MD     Chief Complaint  Patient presents with   S/P TAVR (transcatheter aortic valve replacement)   Follow-up    1 year    Patient Profile: .     Cesar Bullock is a very pleasant 84 y.o. male with a PMH noted below who presents here for 39-month follow-up at the request of Wile, Leita DEL, MD.  PMH  S/p TAVR for severe critical AS with resolved Valvular Cardiomyopathy (now with essentially recovery of cardiac function), and hypertension as well as occasional edema who presents here for early annual follow-up to discuss edema at the request of Stephane Leita DEL, MD.  Notable PMH: Severe Aortic Stenosis with Valvular Cardiomyopathy Initial presentation was EF 20 to 25% NYHA class III-IV CHF -> diuresed and referred for TAVR S/p TAVR (03/14/2020): EF up to 50 to 60% 1 year post TAVR with NYHA class I-IIa symptoms. Well-controlled HTN  Cesar Bullock was last seen on 02/18/2023 for routine follow-up doing well.  He only had some lingering ringing in his ears but otherwise had no major symptoms.  Remain active with no cardiac symptoms.  Echocardiogram results have been stable.SABRA  He was not seen directly by Lamarr Hummer, PA for his routine TAVR follow-up but did have an echocardiogram with results reviewed.     Subjective  Discussed the use of AI scribe software for clinical note transcription with the patient, who gave verbal consent to proceed.  History of Present Illness Cesar Bullock is an 84 year old male with history of TAVR for severe AS and mitral valve regurgitation who presents for follow-up and medication refill. His last echocardiogram was in May.  He is here for a follow-up visit primarily to address his medication refill needs, specifically for amlodipine , which he has run out of.  It has been over a year since his last appointment.  He has been doing well with no cardiac issues.  No heart racing, skipping, chest pain, or episodes of atrial fibrillation since the procedure.  No chest pain pressure or dyspnea with rest or exertion.  No PND, orthopnea or edema.  Energy level doing well.  No syncope/near syncope or TIA/amaurosis fugax.  No claudication.  Tolerating meds well.  He is currently taking amlodipine , which he needs refilled, and uses amoxicillin  for dental or GI procedures as needed. He is completely out of amlodipine .  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     Objective   Medications: Amlodipine  10 mg daily, carvedilol  3.25 mg twice daily, irbesartan  300 mg daily Aspirin  81 mg daily; rosuvastatin  10 mg daily Furosemide  40 mg PRN-not using. Pepcid Complete (famotidine-calcium  carbonate-magnesium  hydroxide: 10-800-165 mg) as needed  Studies Reviewed: SABRA   EKG Interpretation Date/Time:  Monday July 05 2024 14:43:06 EST Ventricular Rate:  60 PR Interval:  224 QRS Duration:  134 QT Interval:  452 QTC Calculation: 452 R Axis:   -70  Text Interpretation: Sinus rhythm with 1st degree A-V block Left axis deviation Non-specific intra-ventricular conduction block Minimal voltage criteria for LVH, may be normal variant ( Cornell product ) Cannot rule out Anterior infarct (cited on or before 18-Feb-2023) When compared with ECG of  18-Feb-2023 15:18, Questionable change in initial forces of Septal leads Confirmed by Anner Lenis ([id number]) on 07/05/2024 4:15:23 PM    He is due for labs to be checked by PCP-most recently was in August 2024- TC 232, TG 57, HDL 68, LDL 153, A1c 5.4 Results Diagnostic Echocardiogram (10/2023): S/p 26 mm Edwards SAPIEN TAVR (03/14/2020) mild central AI with no stenosis or PVL.  Normal LVEF 55 to 60%.  No RWMA.  Mild  LVH.  Hemodynamic suggest tier 1 DD but could be higher.  Normal RAP and RVP.  Degenerative mitral valve with moderate MR and severe MAC. Stable compared to prior studies. Comparison(s): A prior study was performed on 04/23/2023. LVEF 60-65%, Grade I diastolic dysfunction, E/e'17, LAE moderate, mild/moderate MR, s/pTAVR mild AR, ascending aorta 38mm, RAP estimated .  .  Preop R&L Heart Cath (02/16/2020): Angiographically minimal CAD.  Moderate pulmonary pretension.  Severe AS and EF estimated 25 to 30%.  CO/CI 3.25/1.82. PAP-mean: 67/25 mmHg - 41 mmHg; PCWP 27 mmHg Not assessed, elevated PCWP. Ao pressure 136/74 mmHg-MAP 99 mmHg; RAP 6 mmHg. RVP-EDP: 67/4 mmHg - 9 mmHg  Ao sat 95%, PA sat 56%:CARDIAC OUTPUT-INDEX (Fick): 3.25-122     Risk Assessment/Calculations:          Physical Exam:   VS:  BP (!) 116/53 (BP Location: Left Arm, Patient Position: Sitting, Cuff Size: Normal)   Pulse (!) 56   Resp 16   Ht 5' 9 (1.753 m)   Wt 136 lb 3.2 oz (61.8 kg)   SpO2 98%   BMI 20.11 kg/m    Wt Readings from Last 3 Encounters:  07/05/24 136 lb 3.2 oz (61.8 kg)  02/18/23 138 lb 9.6 oz (62.9 kg)  04/30/22 145 lb 6.4 oz (66 kg)     GEN: Thin but healthy appearing.  Well nourished, well groomed; In no acute distress; NECK: No JVD; No carotid bruits CARDIAC: RRR; Normal S1, S2; harsh 1/6 SEM at RUSB but otherwise no additional murmurs, rubs, gallops RESPIRATORY:  Clear to auscultation without rales, wheezing or rhonchi ; nonlabored, good air movement. ABDOMEN: Soft, non-tender, non-distended EXTREMITIES:  No edema; No deformity     ASSESSMENT AND PLAN: .    S/P TAVR (transcatheter aortic valve replacement) s/p TAVR with a 26 mm Edwards S3U via the TF approach by Dr. Verlin and Dr. Lucas => performed for severe symptomatic aortic stenosis with profound CHF and pulmonary hypertension. Status post TAVR with significant symptom improvement. No heart failure or arrhythmias. EKG stable. -  Continue current management and monitoring with annual echocardiogram.  Check 2D Echo in May - SBE prophylaxis discussed-has prescription for amoxicillin  500 mg (2 g-4 tabs 1 hour prior to dental or GI procedures)  Valvular cardiomyopathy (HCC)-RESOLVED Severely reduced EF with significant CHF symptoms prior to TAVR now with complete resolution of symptoms and improved EF now 55 to 60% with no RWMA.  The only residual finding is moderate MR with severe MAC.  Completely asymptomatic. -Continue to follow with routine echocardiograms.  Mitral valve regurgitation Mild to moderate mitral regurgitation secondary to previous severe aortic stenosis. No symptoms of worsening. - Ordered echocardiogram in May to monitor mitral valve function. - Continue annual echocardiogram monitoring.  Essential hypertension Hypertension managed with amlodipine  10 mg, irbesartan  300 mg and carvedilol  3.125 mg twice daily - Refilled amlodipine  prescription, and ensured of the prescriptions are up-to-date.. - Continue current meds as BP is well-controlled.  HLD (hyperlipidemia) His lipids in 2024 were pretty poorly controlled.  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  He eats well. Recommend PCP continue to follow lipids and he remains on rosuvastatin  10 mg daily. Should target LDL closer to 100 => will defer to PCP.   Orders Placed This Encounter  Procedures   EKG 12-Lead   ECHOCARDIOGRAM COMPLETE          Follow-Up: Return in about 1 year (around 07/05/2025) for Routine follow up with me, [employer].     Signed, Alm MICAEL Clay, MD, MS Alm Clay, M.D., M.S. Interventional Cardiologist  [facility] Pager # [phone]      "

## 2024-07-05 NOTE — Patient Instructions (Addendum)
 Medication Instructions:  No changes   *If you need a refill on your cardiac medications before your next appointment, please call your pharmacy*   Lab Work: Not needed If you have labs (blood work) drawn today and your tests are completely normal, you will receive your results only by: MyChart Message (if you have MyChart) OR A paper copy in the mail If you have any lab test that is abnormal or we need to change your treatment, we will call you to review the results.   Testing/Procedures:   May 2026- Your physician has requested that you have an echocardiogram. Echocardiography is a painless test that uses sound waves to create images of your heart. It provides your doctor with information about the size and shape of your heart and how well your hearts chambers and valves are working. This procedure takes approximately one hour. There are no restrictions for this procedure. Please do NOT wear cologne, perfume, aftershave, or lotions (deodorant is allowed). Please arrive 15 minutes prior to your appointment time.  Please note: We ask at that you not bring children with you during ultrasound (echo/ vascular) testing. Due to room size and safety concerns, children are not allowed in the ultrasound rooms during exams. Our front office staff cannot provide observation of children in our lobby area while testing is being conducted. An adult accompanying a patient to their appointment will only be allowed in the ultrasound room at the discretion of the ultrasound technician under special circumstances. We apologize for any inconvenience.   Follow-Up: At [facility], you and your health needs are our priority.  As part of our continuing mission to provide you with exceptional heart care, we have created designated Provider Care Teams.  These Care Teams include your primary Cardiologist (physician) and Advanced Practice Providers (APPs -  Physician Assistants and Nurse Practitioners) who all work  together to provide you with the care you need, when you need it.     Your next appointment:   12 month(s)  The format for your next appointment:   In Person  Provider:   Alm Clay, MD   Other Instructions

## 2024-07-10 ENCOUNTER — Encounter: Payer: Self-pay | Admitting: Cardiology

## 2024-07-10 NOTE — Assessment & Plan Note (Signed)
 Severely reduced EF with significant CHF symptoms prior to TAVR now with complete resolution of symptoms and improved EF now 55 to 60% with no RWMA.  The only residual finding is moderate MR with severe MAC.  Completely asymptomatic. -Continue to follow with routine echocardiograms.

## 2024-07-10 NOTE — Assessment & Plan Note (Addendum)
 His lipids in 2024 were pretty poorly controlled.  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  He eats well. Recommend PCP continue to follow lipids and he remains on rosuvastatin  10 mg daily. Should target LDL closer to 100 => will defer to PCP.

## 2024-07-10 NOTE — Assessment & Plan Note (Addendum)
 s/p TAVR with a 26 mm Edwards S3U via the TF approach by Dr. Verlin and Dr. Lucas => performed for severe symptomatic aortic stenosis with profound CHF and pulmonary hypertension. Status post TAVR with significant symptom improvement. No heart failure or arrhythmias. EKG stable. - Continue current management and monitoring with annual echocardiogram.  Check 2D Echo in May - SBE prophylaxis discussed-has prescription for amoxicillin  500 mg (2 g-4 tabs 1 hour prior to dental or GI procedures)

## 2024-07-10 NOTE — Assessment & Plan Note (Signed)
 Hypertension managed with amlodipine  10 mg, irbesartan  300 mg and carvedilol  3.125 mg twice daily - Refilled amlodipine  prescription, and ensured of the prescriptions are up-to-date.. - Continue current meds as BP is well-controlled.

## 2024-07-10 NOTE — Assessment & Plan Note (Signed)
 Mild to moderate mitral regurgitation secondary to previous severe aortic stenosis. No symptoms of worsening. - Ordered echocardiogram in May to monitor mitral valve function. - Continue annual echocardiogram monitoring.

## 2024-11-02 ENCOUNTER — Ambulatory Visit (HOSPITAL_COMMUNITY)
